# Patient Record
Sex: Female | Born: 1935 | Race: Black or African American | Hispanic: No | Smoking: Former smoker
Health system: Southern US, Community
[De-identification: ages and names within clinical notes are randomized; demographics above are authoritative.]

## PROBLEM LIST (undated history)

## (undated) DIAGNOSIS — D649 Anemia, unspecified: Secondary | ICD-10-CM

## (undated) DIAGNOSIS — F32A Depression, unspecified: Secondary | ICD-10-CM

## (undated) DIAGNOSIS — M069 Rheumatoid arthritis, unspecified: Secondary | ICD-10-CM

## (undated) DIAGNOSIS — F329 Major depressive disorder, single episode, unspecified: Secondary | ICD-10-CM

## (undated) DIAGNOSIS — C50919 Malignant neoplasm of unspecified site of unspecified female breast: Secondary | ICD-10-CM

## (undated) DIAGNOSIS — F419 Anxiety disorder, unspecified: Secondary | ICD-10-CM

## (undated) HISTORY — PX: OTHER SURGICAL HISTORY: SHX169

## (undated) HISTORY — DX: Malignant neoplasm of unspecified site of unspecified female breast: C50.919

## (undated) HISTORY — DX: Anxiety disorder, unspecified: F41.9

## (undated) HISTORY — DX: Major depressive disorder, single episode, unspecified: F32.9

## (undated) HISTORY — DX: Anemia, unspecified: D64.9

## (undated) HISTORY — DX: Rheumatoid arthritis, unspecified: M06.9

## (undated) HISTORY — PX: ANKLE SURGERY: SHX546

## (undated) HISTORY — DX: Depression, unspecified: F32.A

## (undated) HISTORY — PX: CATARACT EXTRACTION: SUR2

---

## 1997-10-21 ENCOUNTER — Encounter: Admission: RE | Admit: 1997-10-21 | Discharge: 1997-10-21 | Payer: Self-pay | Admitting: Hematology and Oncology

## 1998-01-02 ENCOUNTER — Emergency Department (HOSPITAL_COMMUNITY): Admission: EM | Admit: 1998-01-02 | Discharge: 1998-01-02 | Payer: Self-pay | Admitting: Emergency Medicine

## 1998-01-02 ENCOUNTER — Encounter: Payer: Self-pay | Admitting: Emergency Medicine

## 1998-01-05 ENCOUNTER — Emergency Department (HOSPITAL_COMMUNITY): Admission: EM | Admit: 1998-01-05 | Discharge: 1998-01-05 | Payer: Self-pay | Admitting: Emergency Medicine

## 1998-01-05 ENCOUNTER — Encounter: Payer: Self-pay | Admitting: Emergency Medicine

## 1998-03-08 ENCOUNTER — Emergency Department (HOSPITAL_COMMUNITY): Admission: EM | Admit: 1998-03-08 | Discharge: 1998-03-08 | Payer: Self-pay | Admitting: Internal Medicine

## 1999-01-09 ENCOUNTER — Encounter: Payer: Self-pay | Admitting: Emergency Medicine

## 1999-01-09 ENCOUNTER — Emergency Department (HOSPITAL_COMMUNITY): Admission: EM | Admit: 1999-01-09 | Discharge: 1999-01-09 | Payer: Self-pay | Admitting: Emergency Medicine

## 2000-04-01 ENCOUNTER — Emergency Department (HOSPITAL_COMMUNITY): Admission: EM | Admit: 2000-04-01 | Discharge: 2000-04-01 | Payer: Self-pay | Admitting: Emergency Medicine

## 2000-04-12 ENCOUNTER — Encounter: Payer: Self-pay | Admitting: Internal Medicine

## 2000-04-12 ENCOUNTER — Ambulatory Visit (HOSPITAL_COMMUNITY): Admission: RE | Admit: 2000-04-12 | Discharge: 2000-04-12 | Payer: Self-pay | Admitting: Internal Medicine

## 2000-09-07 ENCOUNTER — Encounter: Admission: RE | Admit: 2000-09-07 | Discharge: 2000-09-07 | Payer: Self-pay | Admitting: General Surgery

## 2000-09-07 ENCOUNTER — Encounter (HOSPITAL_BASED_OUTPATIENT_CLINIC_OR_DEPARTMENT_OTHER): Payer: Self-pay | Admitting: General Surgery

## 2001-09-14 ENCOUNTER — Emergency Department (HOSPITAL_COMMUNITY): Admission: EM | Admit: 2001-09-14 | Discharge: 2001-09-14 | Payer: Self-pay | Admitting: *Deleted

## 2002-06-03 ENCOUNTER — Emergency Department (HOSPITAL_COMMUNITY): Admission: EM | Admit: 2002-06-03 | Discharge: 2002-06-03 | Payer: Self-pay

## 2002-06-03 ENCOUNTER — Encounter: Payer: Self-pay | Admitting: Emergency Medicine

## 2003-07-21 ENCOUNTER — Emergency Department (HOSPITAL_COMMUNITY): Admission: EM | Admit: 2003-07-21 | Discharge: 2003-07-21 | Payer: Self-pay | Admitting: Emergency Medicine

## 2004-06-10 ENCOUNTER — Ambulatory Visit: Payer: Self-pay | Admitting: Internal Medicine

## 2004-07-17 ENCOUNTER — Ambulatory Visit: Payer: Self-pay | Admitting: Internal Medicine

## 2004-07-30 ENCOUNTER — Ambulatory Visit: Payer: Self-pay | Admitting: Internal Medicine

## 2004-08-03 ENCOUNTER — Emergency Department (HOSPITAL_COMMUNITY): Admission: EM | Admit: 2004-08-03 | Discharge: 2004-08-03 | Payer: Self-pay | Admitting: Emergency Medicine

## 2004-11-23 ENCOUNTER — Ambulatory Visit: Payer: Self-pay | Admitting: Internal Medicine

## 2005-03-18 ENCOUNTER — Emergency Department (HOSPITAL_COMMUNITY): Admission: EM | Admit: 2005-03-18 | Discharge: 2005-03-18 | Payer: Self-pay | Admitting: Emergency Medicine

## 2005-04-21 ENCOUNTER — Ambulatory Visit: Payer: Self-pay | Admitting: Internal Medicine

## 2005-07-19 ENCOUNTER — Emergency Department (HOSPITAL_COMMUNITY): Admission: EM | Admit: 2005-07-19 | Discharge: 2005-07-19 | Payer: Self-pay | Admitting: Emergency Medicine

## 2006-01-14 ENCOUNTER — Ambulatory Visit: Payer: Self-pay | Admitting: Internal Medicine

## 2006-03-04 ENCOUNTER — Ambulatory Visit: Payer: Self-pay | Admitting: Internal Medicine

## 2006-07-25 ENCOUNTER — Ambulatory Visit: Payer: Self-pay | Admitting: Internal Medicine

## 2006-08-12 ENCOUNTER — Ambulatory Visit: Payer: Self-pay | Admitting: Internal Medicine

## 2006-08-29 ENCOUNTER — Ambulatory Visit: Payer: Self-pay | Admitting: Hematology & Oncology

## 2006-09-23 LAB — CBC WITH DIFFERENTIAL/PLATELET
Basophils Absolute: 0 10*3/uL (ref 0.0–0.1)
EOS%: 1.6 % (ref 0.0–7.0)
Eosinophils Absolute: 0 10*3/uL (ref 0.0–0.5)
HGB: 10.3 g/dL — ABNORMAL LOW (ref 11.6–15.9)
MCH: 33.7 pg (ref 26.0–34.0)
MONO#: 0.2 10*3/uL (ref 0.1–0.9)
Platelets: 278 10*3/uL (ref 145–400)
RDW: 18.3 % — ABNORMAL HIGH (ref 11.3–14.5)
lymph#: 0.3 10*3/uL — ABNORMAL LOW (ref 0.9–3.3)

## 2006-09-26 ENCOUNTER — Ambulatory Visit: Payer: Self-pay | Admitting: Internal Medicine

## 2006-10-05 ENCOUNTER — Encounter: Payer: Self-pay | Admitting: Internal Medicine

## 2006-10-05 ENCOUNTER — Ambulatory Visit: Payer: Self-pay | Admitting: Internal Medicine

## 2006-10-05 DIAGNOSIS — J309 Allergic rhinitis, unspecified: Secondary | ICD-10-CM

## 2006-10-05 DIAGNOSIS — M069 Rheumatoid arthritis, unspecified: Secondary | ICD-10-CM

## 2006-10-12 ENCOUNTER — Ambulatory Visit: Payer: Self-pay | Admitting: Hematology & Oncology

## 2006-11-12 ENCOUNTER — Encounter: Payer: Self-pay | Admitting: *Deleted

## 2006-11-12 DIAGNOSIS — Z9010 Acquired absence of unspecified breast and nipple: Secondary | ICD-10-CM

## 2006-11-12 DIAGNOSIS — C50919 Malignant neoplasm of unspecified site of unspecified female breast: Secondary | ICD-10-CM | POA: Insufficient documentation

## 2006-11-15 ENCOUNTER — Encounter: Payer: Self-pay | Admitting: Internal Medicine

## 2006-11-21 ENCOUNTER — Ambulatory Visit: Payer: Self-pay | Admitting: Internal Medicine

## 2006-11-28 ENCOUNTER — Telehealth: Payer: Self-pay | Admitting: Internal Medicine

## 2006-11-28 ENCOUNTER — Encounter: Payer: Self-pay | Admitting: Endocrinology

## 2006-12-02 ENCOUNTER — Telehealth: Payer: Self-pay | Admitting: Internal Medicine

## 2007-02-10 ENCOUNTER — Encounter: Payer: Self-pay | Admitting: Internal Medicine

## 2007-03-02 ENCOUNTER — Encounter: Payer: Self-pay | Admitting: Internal Medicine

## 2007-03-13 ENCOUNTER — Encounter: Payer: Self-pay | Admitting: Internal Medicine

## 2007-03-31 ENCOUNTER — Encounter: Payer: Self-pay | Admitting: Internal Medicine

## 2007-04-07 ENCOUNTER — Ambulatory Visit: Payer: Self-pay | Admitting: Internal Medicine

## 2007-05-11 ENCOUNTER — Telehealth: Payer: Self-pay | Admitting: Internal Medicine

## 2007-05-14 ENCOUNTER — Encounter: Payer: Self-pay | Admitting: Internal Medicine

## 2007-05-15 ENCOUNTER — Encounter: Payer: Self-pay | Admitting: Internal Medicine

## 2007-05-16 ENCOUNTER — Encounter: Payer: Self-pay | Admitting: Internal Medicine

## 2007-05-19 ENCOUNTER — Encounter: Payer: Self-pay | Admitting: Internal Medicine

## 2007-06-15 ENCOUNTER — Encounter: Payer: Self-pay | Admitting: Internal Medicine

## 2007-06-16 ENCOUNTER — Ambulatory Visit: Payer: Self-pay | Admitting: Internal Medicine

## 2007-06-16 DIAGNOSIS — E785 Hyperlipidemia, unspecified: Secondary | ICD-10-CM | POA: Insufficient documentation

## 2007-06-16 DIAGNOSIS — D649 Anemia, unspecified: Secondary | ICD-10-CM | POA: Insufficient documentation

## 2007-06-16 DIAGNOSIS — F329 Major depressive disorder, single episode, unspecified: Secondary | ICD-10-CM

## 2007-06-16 DIAGNOSIS — F411 Generalized anxiety disorder: Secondary | ICD-10-CM

## 2007-06-16 DIAGNOSIS — J069 Acute upper respiratory infection, unspecified: Secondary | ICD-10-CM | POA: Insufficient documentation

## 2007-06-16 DIAGNOSIS — L989 Disorder of the skin and subcutaneous tissue, unspecified: Secondary | ICD-10-CM | POA: Insufficient documentation

## 2007-06-16 DIAGNOSIS — R5381 Other malaise: Secondary | ICD-10-CM

## 2007-06-16 DIAGNOSIS — R5383 Other fatigue: Secondary | ICD-10-CM

## 2007-06-16 LAB — CONVERTED CEMR LAB
ALT: 19 units/L (ref 0–35)
Albumin: 3.1 g/dL — ABNORMAL LOW (ref 3.5–5.2)
Albumin: 3.5 g/dL (ref 3.5–5.2)
Alkaline Phosphatase: 71 units/L (ref 39–117)
BUN: 11 mg/dL (ref 6–23)
Bilirubin, Direct: 0.2 mg/dL (ref 0.0–0.3)
CO2: 29 meq/L (ref 19–32)
CO2: 31 meq/L (ref 19–32)
Calcium: 8.9 mg/dL (ref 8.4–10.5)
Calcium: 8.9 mg/dL (ref 8.4–10.5)
Creatinine, Ser: 0.9 mg/dL (ref 0.4–1.2)
Folate: >20 ng/mL
HCT: 29.3 % — ABNORMAL LOW (ref 36.0–46.0)
HDL: 30 mg/dL — ABNORMAL LOW (ref >39.0)
Hemoglobin: 10.1 g/dL — ABNORMAL LOW (ref 12.0–15.0)
Hemoglobin: 13.1 g/dL (ref 12.0–15.0)
Iron: 37 ug/dL — ABNORMAL LOW (ref 42–145)
LDL Cholesterol: 76 mg/dL (ref 0–99)
MCV: 90.2 fL (ref 78.0–100.0)
MCV: 95.9 fL (ref 78.0–100.0)
Monocytes Absolute: 0.3 10*3/uL (ref 0.2–0.7)
Monocytes Relative: 10.2 % (ref 3.0–11.0)
Neutro Abs: 1.2 10*3/uL — ABNORMAL LOW (ref 1.4–7.7)
Neutrophils Relative %: 32.1 % — ABNORMAL LOW (ref 43.0–77.0)
Platelets: 212 10*3/uL (ref 150–400)
Potassium: 3.4 meq/L — ABNORMAL LOW (ref 3.5–5.1)
RBC: 3.25 M/uL — ABNORMAL LOW (ref 3.87–5.11)
RBC: 4.05 M/uL (ref 3.87–5.11)
RDW: 16.5 % — ABNORMAL HIGH (ref 11.5–14.6)
TSH: 2.32 microintl units/mL (ref 0.35–5.50)
Total Bilirubin: 0.5 mg/dL (ref 0.3–1.2)
Total Bilirubin: 0.9 mg/dL (ref 0.3–1.2)
Total Protein: 7.8 g/dL (ref 6.0–8.3)
Total Protein: 7.1 g/dL (ref 6.0–8.3)
Triglycerides: 75 mg/dL (ref 0–149)
WBC: 2.9 10*3/uL — ABNORMAL LOW (ref 4.5–10.5)
WBC: 3.6 10*3/uL — ABNORMAL LOW (ref 4.5–10.5)

## 2007-06-19 ENCOUNTER — Encounter: Payer: Self-pay | Admitting: Internal Medicine

## 2007-06-26 ENCOUNTER — Encounter: Payer: Self-pay | Admitting: Internal Medicine

## 2007-06-29 ENCOUNTER — Encounter: Payer: Self-pay | Admitting: Internal Medicine

## 2007-07-12 ENCOUNTER — Encounter: Payer: Self-pay | Admitting: Internal Medicine

## 2007-07-15 ENCOUNTER — Encounter: Payer: Self-pay | Admitting: Internal Medicine

## 2007-07-18 ENCOUNTER — Encounter: Payer: Self-pay | Admitting: Internal Medicine

## 2007-07-19 ENCOUNTER — Encounter: Payer: Self-pay | Admitting: Internal Medicine

## 2007-07-31 ENCOUNTER — Ambulatory Visit: Payer: Self-pay | Admitting: Internal Medicine

## 2007-08-17 ENCOUNTER — Encounter: Payer: Self-pay | Admitting: Internal Medicine

## 2007-08-17 ENCOUNTER — Telehealth: Payer: Self-pay | Admitting: Internal Medicine

## 2007-09-21 ENCOUNTER — Encounter: Payer: Self-pay | Admitting: Internal Medicine

## 2007-09-22 ENCOUNTER — Ambulatory Visit: Payer: Self-pay | Admitting: Internal Medicine

## 2007-09-22 DIAGNOSIS — R1011 Right upper quadrant pain: Secondary | ICD-10-CM

## 2007-09-22 DIAGNOSIS — R82998 Other abnormal findings in urine: Secondary | ICD-10-CM | POA: Insufficient documentation

## 2007-09-23 ENCOUNTER — Encounter: Payer: Self-pay | Admitting: Internal Medicine

## 2007-09-24 ENCOUNTER — Encounter: Payer: Self-pay | Admitting: Internal Medicine

## 2007-09-25 ENCOUNTER — Ambulatory Visit: Payer: Self-pay | Admitting: Internal Medicine

## 2007-09-29 ENCOUNTER — Telehealth: Payer: Self-pay | Admitting: Internal Medicine

## 2007-10-04 ENCOUNTER — Encounter: Payer: Self-pay | Admitting: Internal Medicine

## 2007-10-04 ENCOUNTER — Encounter: Admission: RE | Admit: 2007-10-04 | Discharge: 2007-10-04 | Payer: Self-pay | Admitting: Internal Medicine

## 2007-10-06 ENCOUNTER — Ambulatory Visit: Payer: Self-pay | Admitting: Internal Medicine

## 2007-10-06 DIAGNOSIS — K8020 Calculus of gallbladder without cholecystitis without obstruction: Secondary | ICD-10-CM | POA: Insufficient documentation

## 2007-10-24 ENCOUNTER — Encounter: Payer: Self-pay | Admitting: Internal Medicine

## 2007-11-01 ENCOUNTER — Encounter: Payer: Self-pay | Admitting: Internal Medicine

## 2007-11-15 ENCOUNTER — Encounter: Payer: Self-pay | Admitting: Internal Medicine

## 2007-11-20 ENCOUNTER — Encounter: Payer: Self-pay | Admitting: Internal Medicine

## 2007-12-05 ENCOUNTER — Ambulatory Visit: Payer: Self-pay | Admitting: Internal Medicine

## 2007-12-18 ENCOUNTER — Ambulatory Visit: Payer: Self-pay | Admitting: Internal Medicine

## 2007-12-21 ENCOUNTER — Encounter: Payer: Self-pay | Admitting: Internal Medicine

## 2008-01-15 ENCOUNTER — Encounter: Payer: Self-pay | Admitting: Internal Medicine

## 2008-01-22 ENCOUNTER — Ambulatory Visit: Payer: Self-pay | Admitting: Internal Medicine

## 2008-03-20 ENCOUNTER — Encounter: Payer: Self-pay | Admitting: Internal Medicine

## 2008-04-16 ENCOUNTER — Encounter: Payer: Self-pay | Admitting: Internal Medicine

## 2008-04-18 ENCOUNTER — Encounter: Payer: Self-pay | Admitting: Internal Medicine

## 2008-04-23 ENCOUNTER — Encounter: Payer: Self-pay | Admitting: Internal Medicine

## 2008-05-22 ENCOUNTER — Encounter: Payer: Self-pay | Admitting: Internal Medicine

## 2008-06-16 ENCOUNTER — Encounter: Payer: Self-pay | Admitting: Internal Medicine

## 2008-07-18 ENCOUNTER — Encounter: Payer: Self-pay | Admitting: Internal Medicine

## 2008-07-26 ENCOUNTER — Ambulatory Visit: Payer: Self-pay | Admitting: Internal Medicine

## 2008-08-06 ENCOUNTER — Encounter: Payer: Self-pay | Admitting: Internal Medicine

## 2008-08-09 ENCOUNTER — Encounter: Payer: Self-pay | Admitting: Internal Medicine

## 2008-08-20 ENCOUNTER — Encounter: Payer: Self-pay | Admitting: Internal Medicine

## 2008-08-23 ENCOUNTER — Encounter: Payer: Self-pay | Admitting: Internal Medicine

## 2008-09-02 ENCOUNTER — Encounter: Payer: Self-pay | Admitting: Internal Medicine

## 2008-09-03 ENCOUNTER — Encounter: Payer: Self-pay | Admitting: Internal Medicine

## 2008-10-08 ENCOUNTER — Ambulatory Visit: Payer: Self-pay | Admitting: Internal Medicine

## 2008-10-24 ENCOUNTER — Ambulatory Visit: Payer: Self-pay | Admitting: Internal Medicine

## 2009-05-05 ENCOUNTER — Encounter: Payer: Self-pay | Admitting: Internal Medicine

## 2009-07-19 ENCOUNTER — Emergency Department (HOSPITAL_COMMUNITY): Admission: EM | Admit: 2009-07-19 | Discharge: 2009-07-20 | Payer: Self-pay | Admitting: Emergency Medicine

## 2009-07-24 ENCOUNTER — Ambulatory Visit: Payer: Self-pay | Admitting: Internal Medicine

## 2009-07-24 ENCOUNTER — Telehealth: Payer: Self-pay | Admitting: Internal Medicine

## 2009-07-30 ENCOUNTER — Telehealth: Payer: Self-pay | Admitting: Internal Medicine

## 2009-08-05 ENCOUNTER — Ambulatory Visit: Payer: Self-pay | Admitting: Internal Medicine

## 2009-08-06 LAB — CONVERTED CEMR LAB
ALT: 17 units/L (ref 0–35)
AST: 27 units/L (ref 0–37)
Alkaline Phosphatase: 86 units/L (ref 39–117)
Basophils Absolute: 0 10*3/uL (ref 0.0–0.1)
Basophils Relative: 0.8 % (ref 0.0–3.0)
Bilirubin, Direct: 0.2 mg/dL (ref 0.0–0.3)
Calcium: 8.8 mg/dL (ref 8.4–10.5)
Creatinine, Ser: 0.7 mg/dL (ref 0.4–1.2)
Eosinophils Absolute: 0 10*3/uL (ref 0.0–0.7)
Folate: >20 ng/mL
Glucose, Bld: 78 mg/dL (ref 70–99)
HCT: 33.2 % — ABNORMAL LOW (ref 36.0–46.0)
Lymphocytes Relative: 41.5 % (ref 12.0–46.0)
MCV: 97.6 fL (ref 78.0–100.0)
Potassium: 4 meq/L (ref 3.5–5.1)
RBC: 3.4 M/uL — ABNORMAL LOW (ref 3.87–5.11)
RDW: 17.6 % — ABNORMAL HIGH (ref 11.5–14.6)
WBC: 2.2 10*3/uL — ABNORMAL LOW (ref 4.5–10.5)

## 2009-09-22 ENCOUNTER — Telehealth: Payer: Self-pay | Admitting: Internal Medicine

## 2009-09-30 ENCOUNTER — Encounter: Payer: Self-pay | Admitting: Internal Medicine

## 2009-10-22 ENCOUNTER — Encounter: Payer: Self-pay | Admitting: Internal Medicine

## 2009-12-22 ENCOUNTER — Ambulatory Visit: Payer: Self-pay | Admitting: Internal Medicine

## 2009-12-23 ENCOUNTER — Encounter: Payer: Self-pay | Admitting: Internal Medicine

## 2010-02-03 NOTE — Progress Notes (Signed)
Summary: F/U OV   Phone Note Call from Patient Call back at [facility] Phone [phone]   Summary of Call: Someone called for pt, c/o trouble w/medication.  Initial call taken by: Lamar Sprinkles, CMA,  July 24, 2009 10:29 AM  Follow-up for Phone Call        Spoke w/pt. She c/o weakness from medications. ER advised pt to f/u w/pcp. Scheduled for office visit today.  Follow-up by: Lamar Sprinkles, CMA,  July 24, 2009 10:44 AM  Additional Follow-up for Phone Call Additional follow up Details #1::        seen Additional Follow-up by: Jacques Navy MD,  July 24, 2009 4:15 PM

## 2010-02-03 NOTE — Letter (Signed)
Summary: Stacey Drain MD  Stacey Drain MD   Imported By: Sherian Rein 10/30/2009 13:25:48  _____________________________________________________________________  External Attachment:    Type:   Image     Comment:   External Document

## 2010-02-03 NOTE — Letter (Signed)
Summary: Handicapped [employer]acard/[facility]  Handicapped Placard/[facility]   Imported By: Sherian Rein 10/01/2009 13:56:24  _____________________________________________________________________  External Attachment:    Type:   Image     Comment:   External Document

## 2010-02-03 NOTE — Letter (Signed)
Summary: Aundra Dubin MD  Aundra Dubin MD   Imported By: Sherian Rein 05/14/2009 09:20:54  _____________________________________________________________________  External Attachment:    Type:   Image     Comment:   External Document

## 2010-02-03 NOTE — Assessment & Plan Note (Signed)
Summary: PER [name] SCHED--FU---STC   Vital Signs:  Patient profile:   77 year old female Height:      63 inches Weight:      110 pounds BMI:     19.56 O2 Sat:      97 % on Room air Temp:     97.1 degrees F oral Pulse rate:   76 / minute BP sitting:   122 / 70  (right arm) Cuff size:   regular  Vitals Entered By: Bill Salinas CMA (August 05, 2009 10:48 AM)  O2 Flow:  Room air Comments Pt states as of right now she is not taking any of her medications/ ab   Primary Care Zyiere Rosemond:  [facility]   History of Present Illness: Patient seen July 21 st for weakness. That note reviewed. In the interim she has stopped taking prednisone and Arava and she feels that she is doing better without these two medications. She continues to feel run down. Her diet is not taking in much protein nor supplements. She has not had any recent labwork here, but may have had lab at Dr. Jiles Garter.   Preventive Screening-Counseling & Management  Alcohol-Tobacco     Alcohol drinks/day: 0     Smoking Status: never  Current Medications (verified): 1)  Folic Acid 1 Mg  Tabs (Folic Acid) .... Take One Tablet Once Daily 2)  Prednisone 10 Mg  Tabs (Prednisone) .Marland Kitchen.. 1 By Mouth Qd 3)  Arava 20 Mg  Tabs (Leflunomide) .Marland Kitchen.. 1 By Mouth Qd 4)  Cetirizine Hcl 10 Mg  Tabs (Cetirizine Hcl) .Marland Kitchen.. 1 By Mouth Once Daily As Needed Allergy 5)  Calcium-D 600-200 Mg-Unit Tabs (Calcium Carbonate-Vitamin D) .... Take 1 Tablet By Mouth Once A Day 6)  Alprazolam 0.25 Mg Tbdp (Alprazolam) .Marland Kitchen.. 1 By Mouth Q6 As Needed "nervous Feeling."  Allergies (verified): 1)  ! Penicillin  Past History:  Past Medical History: Last updated: 06/16/2007 ARTHRITIS, RHEUMATOID (ICD-714.0) RHINITIS, ALLERGIC NEC (ICD-477.8) Hx of ADENOCARCINOMA, BREAST (ICD-174.9) Anxiety Depression with hospn Anemia-NOS Hyperlipidemia venous insufficiency/peripheral edema  Past Surgical History: Last updated: 06/16/2007 MASTECTOMY, LEFT, HX OF  (ICD-V45.71) s/p ORIF left leg 1984 Cataract extraction PSH reviewed for relevance, FH reviewed for relevance  Review of Systems       The patient complains of dyspnea on exertion, muscle weakness, and difficulty walking.  The patient denies anorexia, fever, weight loss, chest pain, peripheral edema, prolonged cough, abdominal pain, severe indigestion/heartburn, suspicious skin lesions, and depression.    Physical Exam  General:  Elderly and frail appearing AA female in no distress Head:  normocephalic and atraumatic.   Eyes:  vision grossly intact.  C&S clear without jaundice Mouth:  poor dentition Lungs:  normal respiratory effort and normal breath sounds.   Heart:  normal rate and regular rhythm.   Abdomen:  soft, non-tender, and normal bowel sounds.   Msk:  enalrge DIP and PIP joints. No erythema, no synovial thickening, no joint tenderness. Pulses:  2+ radial Neurologic:  alert & oriented X3 and cranial nerves II-XII intact.   Skin:  turgor normal and color normal.   Psych:  Oriented X3, memory intact for recent and remote, and normally interactive.     Impression & Recommendations:  Problem # 1:  FATIGUE (ICD-780.79) Patient with persistent weakness without focal complaints. Suspect this is multi-factorial including advanced age, nutrition issues, her underlying chronic disease.  Plan - lab evaluation to r/o underlying metabolic derangement or malnutrition.   Orders: TLB-CBC Platelet -  w/Differential (85025-CBCD) TLB-BMP (Basic Metabolic Panel-BMET) (80048-METABOL) TLB-B12 + Folate Pnl (16109_60454-U98/JXB) TLB-Protein, Total (84155-TP) TLB-Albumin (82040-ALB) TLB-TSH (Thyroid Stimulating Hormone) (84443-TSH) TLB-IBC Pnl (Iron/FE;Transferrin) (83550-IBC) TLB-Hepatic/Liver Function Pnl (80076-HEPATIC)  Addendum - lab results reveal mild leukopenia with WBC 2.2, mild anemia. Labs otherwise normal. (see below)  Plan - better nutrition. More exercise and movement.             If leaving off prednisone and Arava need to watch carefully for any recurrent RA symptoms.   Complete Medication List: 1)  Folic Acid 1 Mg Tabs (Folic acid) .... Take one tablet once daily 2)  Prednisone 10 Mg Tabs (Prednisone) .Marland Kitchen.. 1 by mouth qd 3)  Arava 20 Mg Tabs (Leflunomide) .Marland Kitchen.. 1 by mouth qd 4)  Cetirizine Hcl 10 Mg Tabs (Cetirizine hcl) .Marland Kitchen.. 1 by mouth once daily as needed allergy 5)  Calcium-d 600-200 Mg-unit Tabs (Calcium carbonate-vitamin d) .... Take 1 tablet by mouth once a day 6)  Alprazolam 0.25 Mg Tbdp (Alprazolam) .Marland Kitchen.. 1 by mouth q6 as needed "nervous feeling."  Patient: Angela [name] Note: All result statuses are Final unless otherwise noted.  Tests: (1) CBC Platelet w/Diff (CBCD)   White Cell Count     [L]  2.2 K/uL                    4.5-10.5     Rechecked and verified result.   Red Cell Count       [L]  3.40 Mil/uL                 3.87-5.11   Hemoglobin           [L]  11.4 g/dL                   14.7-82.9   Hematocrit           [L]  33.2 %                      36.0-46.0   MCV                       97.6 fl                     78.0-100.0   MCHC                      34.5 g/dL                   56.2-13.0   RDW                  [H]  17.6 %                      11.5-14.6   Platelet Count            241.0 K/uL                  150.0-400.0   Neutrophil %         [L]  41.0 %                      43.0-77.0   Lymphocyte %              41.5 %  12.0-46.0   Monocyte %           [H]  14.9 %                      3.0-12.0   Eosinophils%              1.8 %                       0.0-5.0   Basophils %               0.8 %                       0.0-3.0   Neutrophill Absolute [L]  0.9 K/uL                    1.4-7.7   Lymphocyte Absolute       0.9 K/uL                    0.7-4.0   Monocyte Absolute         0.3 K/uL                    0.1-1.0  Eosinophils, Absolute                             0.0 K/uL                    0.0-0.7   Basophils Absolute        0.0  K/uL                    0.0-0.1  Tests: (2) BMP (METABOL)   Sodium                    145 mEq/L                   135-145   Potassium                 4.0 mEq/L                   3.5-5.1   Chloride                  110 mEq/L                   96-112   Carbon Dioxide            27 mEq/L                    19-32   Glucose                   78 mg/dL                    91-47   BUN                       14 mg/dL                    8-29   Creatinine                0.7 mg/dL  0.4-1.2   Calcium                   8.8 mg/dL                   7.2-53.6   GFR                       97.97 mL/min                >60  Tests: (3) B12 + Folate Panel (B12/FOL)   Vitamin B12          [H]  1018 pg/mL                  211-911   Folate                    >20.0 ng/mL     Deficient  0.4 - 3.4 ng/mL     Indeterminate  3.4 - 5.4 ng/mL     Normal  >5.4 ng/mL  Tests: (4) Total Protein (TP)   Total Protein             6.4 g/dL                    6.4-4.0  Tests: (5) Albumin (ALB)   Albumin                   3.6 g/dL                    3.4-7.4  Tests: (6) TSH (TSH)   FastTSH                   1.86 uIU/mL                 0.35-5.50  Tests: (7) IBC Panel (IBC)   Iron                      62 ug/dL                    25-956   Transferrin          [L]  163.1 mg/dL                 387.5-643.3   Iron Saturation           27.2 %                      20.0-50.0  Tests: (8) Hepatic/Liver Function Panel (HEPATIC)   Total Bilirubin           0.6 mg/dL                   2.9-5.1   Direct Bilirubin          0.2 mg/dL                   8.8-4.1   Alkaline Phosphatase      86 U/L                      39-117   AST                       27 U/L                      0-37  ALT                       17 U/L                      0-35   Total Protein             6.4 g/dL                    1.6-1.0   Albumin                   3.6 g/dL                    9.6-0.4

## 2010-02-03 NOTE — Assessment & Plan Note (Signed)
Summary: weakness/?from medication/SD   Vital Signs:  Patient profile:   77 year old female Height:      63 inches Weight:      110 pounds BMI:     19.56 O2 Sat:      97 % on Room air Temp:     97.8 degrees F oral Pulse rate:   85 / minute BP sitting:   112 / 64  (left arm) Cuff size:   regular  Vitals Entered By: Bill Salinas CMA (July 24, 2009 2:21 PM)  O2 Flow:  Room air CC: ov to discuss medications. Pt states medications makes me feel nervous/ab   Primary Care Provider:  Zevin Nevares  CC:  ov to discuss medications. Pt states medications makes me feel nervous/ab.  History of Present Illness: Patient presents to discuss medications: she says her medication makes her too weak. She was seen in the ED over the weekend. Reviewed records: normal exam except for w/c bound and very elderly, non-focal neuro exam; CBC, CMet, cardiac enzymes normal, CXR normal, EKG normal, CT brain normal. No CVA or other metabolic derangement noted.   Patient since the ED visit has not taken her medication and still has weakness.   Current Medications (verified): 1)  Folic Acid 1 Mg  Tabs (Folic Acid) .... Take One Tablet Once Daily 2)  Prednisone 10 Mg  Tabs (Prednisone) .Marland Kitchen.. 1 By Mouth Qd 3)  Arava 20 Mg  Tabs (Leflunomide) .Marland Kitchen.. 1 By Mouth Qd 4)  Cetirizine Hcl 10 Mg  Tabs (Cetirizine Hcl) .Marland Kitchen.. 1 By Mouth Once Daily As Needed Allergy 5)  Calcium-D 600-200 Mg-Unit Tabs (Calcium Carbonate-Vitamin D) .... Take 1 Tablet By Mouth Once A Day 6)  Alprazolam 0.25 Mg Tbdp (Alprazolam) .Marland Kitchen.. 1 By Mouth Q6 As Needed "nervous Feeling."  Allergies (verified): 1)  ! Penicillin  Past History:  Past Medical History: Last updated: 06/16/2007 ARTHRITIS, RHEUMATOID (ICD-714.0) RHINITIS, ALLERGIC NEC (ICD-477.8) Hx of ADENOCARCINOMA, BREAST (ICD-174.9) Anxiety Depression with hospn Anemia-NOS Hyperlipidemia venous insufficiency/peripheral edema  Past Surgical History: Last updated: 06/16/2007 MASTECTOMY,  LEFT, HX OF (ICD-V45.71) s/p ORIF left leg 1984 Cataract extraction  Family History: Last updated: 10/08/2008 father with  cancer mother with DM sister with breast cancer daughter with leukemia son with arthritis  Social History: Last updated: 06/16/2007 widow since 1970 wheelchair bound, lives with son Never Smoked Alcohol use-no 8 children  Risk Factors: Smoking Status: never (06/16/2007) Passive Smoke Exposure: no (09/22/2007)  Review of Systems       The patient complains of muscle weakness and difficulty walking.  The patient denies anorexia, fever, chest pain, syncope, prolonged cough, headaches, abdominal pain, severe indigestion/heartburn, suspicious skin lesions, abnormal bleeding, and enlarged lymph nodes.    Physical Exam  General:  Very elderly, thin AA female in her w/c in no distress.  Head:  normocephalic and atraumatic.   Neck:  supple.   Lungs:  normal respiratory effort and normal breath sounds.   Heart:  normal rate and regular rhythm.   Msk:  no joint swelling, no joint warmth, and no redness over joints.   Neurologic:  alert & oriented X3.   Skin:  turgor normal and color normal.   Psych:  Oriented X3, normally interactive, and good eye contact.     Impression & Recommendations:  Problem # 1:  FATIGUE (ICD-780.79) Patient c/o weakness that she attributes to her medications. Her ED eval was very thorough and no additional testing is indicated. Her weakness is most likely related  to age and inanition than to medicatinos.  Plan continue to take calcium nd folic acid.         Take only prednisone for three days - assess whether this increases her weakness         Take only ARava for three days - assess whether this increases her weakness  Complete Medication List: 1)  Folic Acid 1 Mg Tabs (Folic acid) .... Take one tablet once daily 2)  Prednisone 10 Mg Tabs (Prednisone) .Marland Kitchen.. 1 by mouth qd 3)  Arava 20 Mg Tabs (Leflunomide) .Marland Kitchen.. 1 by mouth qd 4)   Cetirizine Hcl 10 Mg Tabs (Cetirizine hcl) .Marland Kitchen.. 1 by mouth once daily as needed allergy 5)  Calcium-d 600-200 Mg-unit Tabs (Calcium carbonate-vitamin d) .... Take 1 tablet by mouth once a day 6)  Alprazolam 0.25 Mg Tbdp (Alprazolam) .Marland Kitchen.. 1 by mouth q6 as needed "nervous feeling."

## 2010-02-03 NOTE — Progress Notes (Signed)
Summary: Continued weakness  Phone Note Call from Patient Call back at Home Phone [phone]   Summary of Call: Pt left vm that she took the pills that MD advised and wants a call back.  Initial call taken by: Lamar Sprinkles, CMA,  July 30, 2009 1:22 PM  Follow-up for Phone Call        Spoke w/pt. She stopped predinsone monday, noticed no change in symptoms. Per pt, she becomes weak when she takes all her meds. She noticed increase in weakness after taking folic acid and calcium.   Pt feels she may need to be put in the hosptial for a few days to "sort out" what the cause is. Please advise.  Follow-up by: Lamar Sprinkles, CMA,  July 30, 2009 4:38 PM  Additional Follow-up for Phone Call Additional follow up Details #1::        resume prednisone  Has she stopper Arava?  She can stop calcium.  Doesn't really meet criteria of severity for hospital adm. If she remains in a bad way can consider re-eval in the office MOnday or Tuesday with admission if warranted.  Additional Follow-up by: Jacques Navy MD,  July 31, 2009 9:01 AM    Additional Follow-up for Phone Call Additional follow up Details #2::    informed pt of MD's advisement to continue prednisone and to stop calcium. pt states that prednisone seemed to make her weaker. pt would like to be seen before Monday if possible. ok to sched to see another MD? please advise Follow-up by: Brenton Grills MA,  July 31, 2009 2:25 PM  Additional Follow-up for Phone Call Additional follow up Details #3:: Details for Additional Follow-up Action Taken: It will be difficult for another doctor to pick this up and do much. If she feels that she is in distress then she may be evaluated in the ED for possible admission.    pt notified of MD's advisement and scheduled appt for 08/05/09 Additional Follow-up by: Jacques Navy MD,  July 31, 2009 4:25 PM

## 2010-02-03 NOTE — Progress Notes (Signed)
Summary: FORM (done)  Phone Note Call from Patient   Summary of Call: Patient is requesting handicap form to be completed. She has form and wants to know what to do w/it.  Initial call taken by: Lamar Sprinkles, CMA,  September 22, 2009 11:59 AM

## 2010-02-05 NOTE — Assessment & Plan Note (Signed)
Summary: EVAL FOR POWER CHAIR/ [employer]   Vital Signs:  Patient profile:   77 year old female Height:      63 inches Weight:      120 pounds BMI:     19.56 O2 Sat:      97 % on Room air Temp:     98.4 degrees F oral Pulse rate:   77 / minute BP sitting:   110 / 62  (left arm) Cuff size:   regular  Vitals Entered By: Bill Salinas CMA (December 22, 2009 12:58 PM)  O2 Flow:  Room air CC: pt here for scooter store forms, pt states she is not taking any medications/ ab   Primary Care Jowanda Heeg:  [facility]  CC:  pt here for scooter store forms and pt states she is not taking any medications/ ab.  History of Present Illness: Patient presents for a mobility exam. she, after two years of receiving mailings soliciting her to apply for a power mobility device, she contacted the [employer] store and now needs appropriate paperwork completed. she has never tried to use either a power scooter or w/c and has some questions as to whether there is room in her home or if she has the manual ability to operate a device. Ryane Alwyn Ren of the scooter store was contacted and she assures me that these considerations are dealt with upon delivery of the device to the paitent's home and that if there are physical space limitations or inability to operate the device this is considered.  The patient has progressive RA. She has been seeing Dr. Kellie Simmering for rhematology with her last visit being about 1 month ago. She has been on a variety of medications but at this time is not taking an disease modifying medications.  She has been w/c bound for at least two years. She is able to make transfers from bed to chair and bedside commode but otherwise uses a w/c in the home. Her son lives with her and is able to provide her some assistance. She does have a CNA who comes to the home 5 days/wk and this will be extended to 7 days/ week soon. Patient needs assist with transfers, bathing, toileting, dressing and she does not prepare meals or  do any home maintenance.   Fortunately the patient does not have a great deal of pain.   Current Medications (verified): 1)  Folic Acid 1 Mg  Tabs (Folic Acid) .... Take One Tablet Once Daily 2)  Prednisone 10 Mg  Tabs (Prednisone) .Marland Kitchen.. 1 By Mouth Qd 3)  Arava 20 Mg  Tabs (Leflunomide) .Marland Kitchen.. 1 By Mouth Qd 4)  Cetirizine Hcl 10 Mg  Tabs (Cetirizine Hcl) .Marland Kitchen.. 1 By Mouth Once Daily As Needed Allergy 5)  Calcium-D 600-200 Mg-Unit Tabs (Calcium Carbonate-Vitamin D) .... Take 1 Tablet By Mouth Once A Day 6)  Alprazolam 0.25 Mg Tbdp (Alprazolam) .Marland Kitchen.. 1 By Mouth Q6 As Needed "nervous Feeling."  Allergies (verified): 1)  ! Penicillin  Past History:  Past Medical History: Last updated: 06/16/2007 ARTHRITIS, RHEUMATOID (ICD-714.0) RHINITIS, ALLERGIC NEC (ICD-477.8) Hx of ADENOCARCINOMA, BREAST (ICD-174.9) Anxiety Depression with hospn Anemia-NOS Hyperlipidemia venous insufficiency/peripheral edema  Past Surgical History: Last updated: 06/16/2007 MASTECTOMY, LEFT, HX OF (ICD-V45.71) s/p ORIF left leg 1984 Cataract extraction  Family History: Last updated: 10/08/2008 father with  cancer mother with DM sister with breast cancer daughter with leukemia son with arthritis  Social History: Last updated: 06/16/2007 widow since 1970 wheelchair bound, lives with son Never Smoked  Alcohol use-no 8 children  Review of Systems       The patient complains of weight loss, vision loss, muscle weakness, difficulty walking, and depression.  The patient denies anorexia, fever, weight gain, decreased hearing, chest pain, peripheral edema, headaches, abdominal pain, severe indigestion/heartburn, suspicious skin lesions, unusual weight change, abnormal bleeding, and enlarged lymph nodes.         dense cataract left eye, frequent blurred vision. She reports it has been at least a year since she saw an eye doctor.   Physical Exam  General:  Eldery, thin AA woman in no actue distress Head:   normocephalic and atraumatic.   Eyes:  dense cataract left eye. She has fair vision right eye, very poor vision left eye on simple confrontation Mouth:  very poor dentition with broken teeth and missing teeth Neck:  full ROM and no thyromegaly.   Chest Wall:  no deformities and no tenderness.   Lungs:  normal respiratory effort and normal breath sounds.   Heart:  normal rate and regular rhythm with pvcs.  no rub.   Abdomen:  soft and non-tender.   Msk:  both wrists with limited flexion and extension. Grip is 2/5 bilaterally. she is unable to raise her right arm due to shoulder stiffness. Left arm can be raised 40% of normal. Patinet can flex both legs but cannot hold against resistance. she is unable to straighten her legs on demand Pulses:  2+ radial Extremities:  2+ pedal edema bilaterally Neurologic:  alert & oriented X3 and cranial nerves II-XII intact.  Wheel chair bound Skin:  turgor normal and color normal.  Back and sacrum not examined.  Cervical Nodes:  no anterior cervical adenopathy and no posterior cervical adenopathy.   Axillary Nodes:  no R axillary adenopathy and no L axillary adenopathy.   Psych:  Oriented X3, memory intact for recent and remote, normally interactive, and good eye contact.     Impression & Recommendations:  Problem # 1:  ARTHRITIS, RHEUMATOID (ICD-714.0) Patient is incapacitated by her RA. If she is able to operate a power mobility device, e.g. a power w/c and it fits in her abode then she is a good candidate for this.  Complete Medication List: 1)  Folic Acid 1 Mg Tabs (Folic acid) .... Take one tablet once daily 2)  Prednisone 10 Mg Tabs (Prednisone) .Marland Kitchen.. 1 by mouth qd 3)  Arava 20 Mg Tabs (Leflunomide) .Marland Kitchen.. 1 by mouth qd 4)  Cetirizine Hcl 10 Mg Tabs (Cetirizine hcl) .Marland Kitchen.. 1 by mouth once daily as needed allergy 5)  Calcium-d 600-200 Mg-unit Tabs (Calcium carbonate-vitamin d) .... Take 1 tablet by mouth once a day 6)  Alprazolam 0.25 Mg Tbdp  (Alprazolam) .Marland Kitchen.. 1 by mouth q6 as needed "nervous feeling."   Orders Added: 1)  Est. Patient Level IV [04540]

## 2010-02-05 NOTE — Letter (Signed)
Summary: [employer]   Imported By: Sherian Rein 12/26/2009 10:42:13  _____________________________________________________________________  External Attachment:    Type:   Image     Comment:   External Document

## 2010-02-05 NOTE — Letter (Signed)
Summary: [employer]   Imported By: Sherian Rein 12/30/2009 12:51:32  _____________________________________________________________________  External Attachment:    Type:   Image     Comment:   External Document

## 2010-03-14 ENCOUNTER — Emergency Department (HOSPITAL_COMMUNITY)
Admission: EM | Admit: 2010-03-14 | Discharge: 2010-03-14 | Disposition: A | Discharge status: Home / Self Care | Payer: PRIVATE HEALTH INSURANCE | Attending: Emergency Medicine | Admitting: Emergency Medicine

## 2010-03-14 DIAGNOSIS — Z853 Personal history of malignant neoplasm of breast: Secondary | ICD-10-CM | POA: Insufficient documentation

## 2010-03-14 DIAGNOSIS — J40 Bronchitis, not specified as acute or chronic: Secondary | ICD-10-CM | POA: Insufficient documentation

## 2010-03-14 DIAGNOSIS — F411 Generalized anxiety disorder: Secondary | ICD-10-CM | POA: Insufficient documentation

## 2010-03-14 DIAGNOSIS — R059 Cough, unspecified: Secondary | ICD-10-CM | POA: Insufficient documentation

## 2010-03-14 DIAGNOSIS — M069 Rheumatoid arthritis, unspecified: Secondary | ICD-10-CM | POA: Insufficient documentation

## 2010-03-14 DIAGNOSIS — R05 Cough: Secondary | ICD-10-CM | POA: Insufficient documentation

## 2010-03-21 LAB — POCT CARDIAC MARKERS
CKMB, poc: 2.1 ng/mL (ref 1.0–8.0)
Myoglobin, poc: 72 ng/mL (ref 12–200)
Troponin i, poc: <0.05 ng/mL (ref 0.00–0.09)

## 2010-03-21 LAB — URINE CULTURE: Colony Count: NO GROWTH

## 2010-03-21 LAB — DIFFERENTIAL
Basophils Absolute: 0 10*3/uL (ref 0.0–0.1)
Eosinophils Relative: 1 % (ref 0–5)
Lymphocytes Relative: 41 % (ref 12–46)
Monocytes Relative: 8 % (ref 3–12)
Neutrophils Relative %: 50 % (ref 43–77)

## 2010-03-21 LAB — URINALYSIS, ROUTINE W REFLEX MICROSCOPIC
Hgb urine dipstick: NEGATIVE
Ketones, ur: NEGATIVE mg/dL
Specific Gravity, Urine: 1.015 (ref 1.005–1.030)
Urobilinogen, UA: 0.2 mg/dL (ref 0.0–1.0)
pH: 7 (ref 5.0–8.0)

## 2010-03-21 LAB — CBC: MCH: 33.2 pg (ref 26.0–34.0)

## 2010-03-21 LAB — BASIC METABOLIC PANEL
Calcium: 9 mg/dL (ref 8.4–10.5)
GFR calc Af Amer: >60 mL/min (ref >60)
Potassium: 3.9 mEq/L (ref 3.5–5.1)

## 2010-03-21 LAB — BRAIN NATRIURETIC PEPTIDE: Pro B Natriuretic peptide (BNP): 74.8 pg/mL (ref 0.0–100.0)

## 2010-05-19 NOTE — Assessment & Plan Note (Signed)
[facility]                           PRIMARY CARE OFFICE NOTE   Angela [name], Angela [name]                     MRN:          [id number]  DATE:08/12/2006                            DOB:          06/04/1918    [name] is a very pleasant 77 year old African-American woman  who presents for followup evaluation and exam.  She was last seen in the  office January 13, 2006.   1. In the interval since her visit, the patient continues to see Dr.      Stacey Drain for rheumatology.  She is currently taking      methotrexate and prednisone for her rheumatoid arthritis.  She was      last seen by Dr. Kellie Simmering May of 2008.  She was stable at that time      with stable laboratories.  2. Derm:  The patient has aging spots.  She has been seeing Dr. Lonni Fix      for this.  She is using Akurza 6% lotion.  3. Allergy:  The patient continues to see Dr. Barnetta Chapel at [facility]      Allergy and Asthma.   PAST MEDICAL HISTORY:  1. Usual childhood disease.  2. Nervous breakdown with hospitalization.  3. Rheumatoid arthritis.  4. Breast cancer.   SURGICAL HISTORY:  1. ORIF of broken leg in 1984.  2. Mastectomy, left without adjuvant therapy.   CURRENT MEDICATIONS:  1. Methotrexate 12.5 mg weekly.  2. Calcium 600 mg daily with vitamin D.  3. Prednisone 10 mg daily.  4. Akurza lotion as noted.  5. Celebrex 200 mg p.r.n.   DRUG ALLERGIES:  PENICILLIN.   FAMILY HISTORY:  Father died at age 5 of cancer.  Mother died in her  62s and was diabetic.  The patient had a sister who died of breast  cancer.   SOCIAL HISTORY:  The patient was widowed in 1970.  She is a gravida 9,  para 8.  She has multiple grandchildren.  She has 5 or more great-  grandchildren.  The patient does reside in a rented home and lives with  her son.  She is on [employer] income.  The patient has significant  disability, but does manage a significant number of ADLs.  She is  wheelchair  bound because of her arthritis.   REVIEW OF SYSTEMS:  Negative for any constitutional, cardiovascular,  respiratory, or GI complaints at this time.   EXAMINATION:  Temperature 97.9, blood pressure 123/72, heart rate was  71.  The patient was not weighed.  GENERAL APPEARANCE:  This is an elderly, frail-appearing African-  American woman in no acute distress.  HEENT:  Normocephalic, atraumatic.  EACs and TMs were normal.  Oropharynx with very poor dentition.  Conjunctivae, sclerae clear.  The  patient has a very dense cataract on the left eye.  The patient has a  small cataract on the right eye and funduscopic exam was unremarkable.  The patient is noticed to have some temporal wasting.  NECK:  Supple.  CHEST:  No deformities are noted.  LUNGS:  Clear with no  rales, wheezes, or rhonchi.  CARDIOVASCULAR:  2+ radial pulse.  She had a regular rate and rhythm  with a 1/6 to 2/6 systolic murmur heard best at the left sternal border.  BREASTS:  The patient has no breast on the left.  There is no nodular  abnormality along her scar line.  The right breast was normal with no  fixed mass, lesion, or abnormalities.  This exam was carried out while  sitting in her wheelchair.  ABDOMEN:  Soft.  No guarding or rebound.  NODES:  No adenopathy was appreciated.  GENITOURINARY:  Deferred.  RECTAL:  Deferred.  EXTREMITIES:  The patient has significant MCP enlargement with some  ulnar deviation.  She has interosseous wasting of both hands.  Lower  extremities appear normal.  DERM:  The patient has multiple macular round lesions.  She has no other  suspicious lesions.   We have no recent laboratory on this patient, but she is followed on a  regular basis by Dr. Kellie Simmering.   CHART REVIEW:  The patient has had lower extremity Dopplers, which  showed normal circulation.  Last chest x-ray was June 10, 2004, which  showed borderline cardiomegaly with mild pulmonary vascular congestion  with mild COPD  changes.   ASSESSMENT AND PLAN:  1. Rheumatologic:  The patient with rheumatoid arthritis, followed by      Dr. Kellie Simmering.  At this time, she is tolerating methotrexate and      prednisone well.  She seems to be comfortable.  She has no obvious      inflammation of joints at this time.  2. Dermatologic:  The patient with macular lesions consistent with      aging.  No further evaluation needed.  3. Ophthalmologic:  The patient with very dense cataract on the left,      cataract on the right.  I have recommended she see ophthalmology.  4. Oncology.  The patient has not had a recent mammogram to my      knowledge.  Her examination today is unremarkable.   SUMMARY:  This is a pleasant woman who has been recalcitrant to undergo  treatment in the past.  Currently is taking medication for rheumatoid  arthritis.  She does seem to be medically stable at this time.  She is  asked to return to see me in 6 months.     Angela Gess Norins, MD  Electronically Signed    MEN/MedQ  DD: 08/13/2006  DT: 08/13/2006  Job #: [id number]   cc:   Aundra Dubin, M.D.

## 2010-07-10 ENCOUNTER — Emergency Department (HOSPITAL_COMMUNITY)
Admission: EM | Admit: 2010-07-10 | Discharge: 2010-07-10 | Disposition: A | Discharge status: Home / Self Care | Payer: PRIVATE HEALTH INSURANCE | Attending: Emergency Medicine | Admitting: Emergency Medicine

## 2010-07-10 ENCOUNTER — Emergency Department (HOSPITAL_COMMUNITY): Payer: PRIVATE HEALTH INSURANCE

## 2010-07-10 DIAGNOSIS — R059 Cough, unspecified: Secondary | ICD-10-CM | POA: Insufficient documentation

## 2010-07-10 DIAGNOSIS — R05 Cough: Secondary | ICD-10-CM | POA: Insufficient documentation

## 2010-07-10 DIAGNOSIS — J449 Chronic obstructive pulmonary disease, unspecified: Secondary | ICD-10-CM | POA: Insufficient documentation

## 2010-07-10 DIAGNOSIS — J069 Acute upper respiratory infection, unspecified: Secondary | ICD-10-CM | POA: Insufficient documentation

## 2010-07-10 DIAGNOSIS — R0602 Shortness of breath: Secondary | ICD-10-CM | POA: Insufficient documentation

## 2010-07-10 DIAGNOSIS — I517 Cardiomegaly: Secondary | ICD-10-CM | POA: Insufficient documentation

## 2010-07-10 DIAGNOSIS — Z853 Personal history of malignant neoplasm of breast: Secondary | ICD-10-CM | POA: Insufficient documentation

## 2010-07-10 DIAGNOSIS — J4489 Other specified chronic obstructive pulmonary disease: Secondary | ICD-10-CM | POA: Insufficient documentation

## 2010-07-10 LAB — GLUCOSE, CAPILLARY: Glucose-Capillary: 102 mg/dL — ABNORMAL HIGH (ref 70–99)

## 2010-08-25 ENCOUNTER — Emergency Department (HOSPITAL_COMMUNITY)
Admission: EM | Admit: 2010-08-25 | Discharge: 2010-08-25 | Disposition: A | Discharge status: Home / Self Care | Payer: PRIVATE HEALTH INSURANCE | Attending: Emergency Medicine | Admitting: Emergency Medicine

## 2010-08-25 DIAGNOSIS — Z853 Personal history of malignant neoplasm of breast: Secondary | ICD-10-CM | POA: Insufficient documentation

## 2010-08-25 DIAGNOSIS — R350 Frequency of micturition: Secondary | ICD-10-CM | POA: Insufficient documentation

## 2010-08-25 DIAGNOSIS — R300 Dysuria: Secondary | ICD-10-CM | POA: Insufficient documentation

## 2010-08-25 LAB — DIFFERENTIAL
Lymphocytes Relative: 60 % — ABNORMAL HIGH (ref 12–46)
Monocytes Absolute: 0.4 10*3/uL (ref 0.1–1.0)

## 2010-08-25 LAB — CBC
MCHC: 32.7 g/dL (ref 30.0–36.0)
MCV: 91.2 fL (ref 78.0–100.0)
RDW: 16.4 % — ABNORMAL HIGH (ref 11.5–15.5)
WBC: 3.1 10*3/uL — ABNORMAL LOW (ref 4.0–10.5)

## 2010-08-25 LAB — URINALYSIS, ROUTINE W REFLEX MICROSCOPIC
Bilirubin Urine: NEGATIVE
Glucose, UA: NEGATIVE mg/dL
Hgb urine dipstick: NEGATIVE
Ketones, ur: NEGATIVE mg/dL
Nitrite: NEGATIVE
pH: 6 (ref 5.0–8.0)

## 2010-08-25 LAB — POCT I-STAT, CHEM 8
Calcium, Ion: 1.16 mmol/L (ref 1.12–1.32)
Glucose, Bld: 101 mg/dL — ABNORMAL HIGH (ref 70–99)
HCT: 36 % (ref 36.0–46.0)
Sodium: 142 mEq/L (ref 135–145)
TCO2: 25 mmol/L (ref 0–100)

## 2010-09-08 ENCOUNTER — Ambulatory Visit: Payer: 59 | Admitting: Internal Medicine

## 2010-09-08 DIAGNOSIS — Z0289 Encounter for other administrative examinations: Secondary | ICD-10-CM

## 2010-09-10 ENCOUNTER — Ambulatory Visit (INDEPENDENT_AMBULATORY_CARE_PROVIDER_SITE_OTHER): Payer: 59 | Admitting: Internal Medicine

## 2010-09-10 ENCOUNTER — Encounter: Payer: Self-pay | Admitting: Internal Medicine

## 2010-09-10 VITALS — BP 124/60 | HR 73 | Temp 97.0°F

## 2010-09-10 DIAGNOSIS — N952 Postmenopausal atrophic vaginitis: Secondary | ICD-10-CM

## 2010-09-10 DIAGNOSIS — M069 Rheumatoid arthritis, unspecified: Secondary | ICD-10-CM

## 2010-09-10 MED ORDER — ESTROGENS, CONJUGATED 0.625 MG/GM VA CREA: TOPICAL_CREAM | VAGINAL | Status: AC

## 2010-09-10 NOTE — Progress Notes (Signed)
  Subjective:    Patient ID: Angela [name], female    DOB: [birth year], 77 y.o.   MRN: [id number]  HPI Mrs. Holck presents for discomfort in the vaginal area. She has been to ED for dysuria (all records reviewed) - negative evaluation, normal labs. She continues to have vaginal discomfort. She reports vaginal discomfort and burning. She will have some pain with urination that is external.    She is having sinus congestion. She has had no fever, she has had no purulent rhinorrhea.   Rash on arms and legs. Not painful or pruritic.  Past Medical History  Diagnosis Date  . Rheumatoid arthritis   . Allergic rhinitis   . Adenocarcinoma of breast   . Anxiety   . Depression   . Anemia    Past Surgical History  Procedure Date  . Masectomy     left breast  . Cataract extraction    Family History  Problem Relation Age of Onset  . Diabetes Mother   . Cancer Father   . Cancer Sister   . Arthritis Son    History   Social History  . Marital Status: Widowed    Spouse Name: N/A    Number of Children: N/A  . Years of Education: N/A   Occupational History  . Not on file.   Social History Main Topics  . Smoking status: Never Smoker   . Smokeless tobacco: Never Used  . Alcohol Use: No  . Drug Use: No  . Sexually Active: No   Other Topics Concern  . Not on file   Social History Narrative   Widow since 1970. 8 children Wheelchair bound due to arthritis.  lives with son       Review of Systems Review of Systems  Constitutional:  Negative for fever, chills, activity change and unexpected weight change.  HEENT:  Negative for hearing loss, ear pain, neck stiffness. Positive for congestion and postnasal drip. Negative for sore throat or swallowing problems. Negative for dental complaints.   Eyes: Negative for vision loss or change in visual acuity.  Respiratory: Negative for chest tightness and wheezing.   Cardiovascular: Negative for chest pain and palpitation.    Gastrointestinal: No change in bowel habit. No bloating or gas. No reflux or indigestion Genitourinary: Negative for urgency, frequency, flank pain and difficulty urinating.  Musculoskeletal: Negative for myalgias, back pain, arthralgias and gait problem.  Neurological: Negative for dizziness, tremors, weakness and headaches.  Hematological: Negative for adenopathy.  Psychiatric/Behavioral: Negative for behavioral problems and dysphoric mood.       Objective:   Physical Exam Vitals noted - stable HEENT- edentulous, no sinus tenderness Chest - good breath sounds w/o rales, wheeze or rhonchi Cor - 2+ radial pulse, RRR        Assessment & Plan:

## 2010-09-10 NOTE — Patient Instructions (Addendum)
Discomfort down below is called atrophic vaginitis - comes with age and dryness. Plan is to use Premarin vaginal cream daily for 10 days and then twice a week.   Sinus congestion - no evidence of bacterial infection today. Plan - take Mucinex 2 tablets in the AM and PM. Also use a nasal saline spray to keep the sinuses moisturized. For drainage you can take loratadine 10 mg once a day - an over the counter antihistamine.  For skin - I don't know what it is. For continued problems will have you see a dermatologist.      Atrophic Vaginitis Atrophic vaginitis is a problem of low levels of estrogen in women.    This problem can happen at any age. It is most common in women who have gone through menopause ("the change"). Menopause happens in women who:  Are ages 44-55.   Have had surgery to remove their ovaries.  It can also happen in women:  Who are breast-feeding.   Using medicines that lowers estrogen levels in the blood.   Who are having radiation or cancer treatments.  HOW WILL I KNOW IF I HAVE THIS PROBLEM? You may have:  Trouble with peeing, such as:   Going to the bathroom often.     A hard time holding the pee till you reach a bathroom.     Leaking pee.     Having pain when you pee.     Itching and/or a burning feeling.   Vaginal bleeding and spotting.     Pain during sex.     Dryness of the vagina.     A yellow, bad smelling discharge coming from the vagina.     HOW WILL MY DOCTOR CHECK FOR THIS PROBLEM?  During your exam, your doctor will likely find the problem.   If there is a vaginal discharge, it may be checked for infection.  HOW WILL THIS PROBLEM BE TREATED?  Keep the vulvar skin as clean as possible. Moisturizers and lubricants can help with some of the symptoms.   Estrogen replacement can help. There are 2 ways to take estrogen:   Systemic Estrogen gets estrogen to your whole body. It takes many weeks or months before the symptoms get better.  -  You take an estrogen pill. - You use a skin Patch. This is a patch that you put on your skin.   - If you still have your uterus, your doctor may ask you to take a hormone. Talk to your doctor about the right medicine for you.  Estrogen Cream. This puts estrogen only at the part of your body where you apply it. The cream is put into the vagina or put on the vulvar skin. For some women, estrogen cream works faster than pills or the patch.  CAN ALL WOMEN WITH THIS PROBLEM USE ESTROGEN? No. Women with certain types of cancer, liver problems, or problems with blood clots should not take estrogen. Your doctor can help you decide the best treatment for your symptoms. Easy-to-Read style based on content from [facility], [place], [place] Document Released: 06/09/2007 Document Re-Released: 10/24/2007 [facility] Patient Information 2011 [place], [place].

## 2010-09-12 ENCOUNTER — Encounter: Payer: Self-pay | Admitting: Internal Medicine

## 2010-09-12 DIAGNOSIS — N952 Postmenopausal atrophic vaginitis: Secondary | ICD-10-CM | POA: Insufficient documentation

## 2010-09-12 NOTE — Assessment & Plan Note (Signed)
Probable atrophic vaginitis by heer description of symptoms and age.  Plan - premarin vaginal cream

## 2010-09-12 NOTE — Assessment & Plan Note (Signed)
On no medication and seems to be doing OK with no inflammation of small or medium joints.

## 2010-11-04 ENCOUNTER — Telehealth: Payer: Self-pay | Admitting: *Deleted

## 2010-11-04 NOTE — Telephone Encounter (Signed)
Patient requesting a call back to discuss jury duty letter she received in the mail.

## 2010-11-05 NOTE — Telephone Encounter (Signed)
Pt is needing a letter stating that she cannot sit for a trial due to her arthritis and age

## 2010-11-05 NOTE — Telephone Encounter (Signed)
Doesn't need a letter - she will be excused by letting clerk of court know her age.

## 2010-11-06 NOTE — Telephone Encounter (Signed)
Informed pt .

## 2011-04-06 ENCOUNTER — Ambulatory Visit (INDEPENDENT_AMBULATORY_CARE_PROVIDER_SITE_OTHER): Payer: PRIVATE HEALTH INSURANCE | Admitting: Internal Medicine

## 2011-04-06 ENCOUNTER — Encounter: Payer: Self-pay | Admitting: Internal Medicine

## 2011-04-06 VITALS — BP 120/82 | HR 68 | Temp 98.5°F | Resp 14

## 2011-04-06 DIAGNOSIS — H5462 Unqualified visual loss, left eye, normal vision right eye: Secondary | ICD-10-CM

## 2011-04-06 DIAGNOSIS — H5460 Unqualified visual loss, one eye, unspecified: Secondary | ICD-10-CM

## 2011-04-06 DIAGNOSIS — Z515 Encounter for palliative care: Secondary | ICD-10-CM

## 2011-04-06 DIAGNOSIS — J3089 Other allergic rhinitis: Secondary | ICD-10-CM

## 2011-04-06 NOTE — Assessment & Plan Note (Signed)
Symptoms are flaring. No sign of infection at today's exam.  Plan - loratadine 10 mg once a day.

## 2011-04-06 NOTE — Patient Instructions (Signed)
Sinus drainage - no sign of infection. This appears to be allergic rhinitis. Plan - over the counter loratadine (claritin) 10 mg once a day.  Loss of vision - this may be an advanced cataract vs other. Plan - referral to [facility] for exam and treatment.   Allergic Rhinitis Allergic rhinitis is when the mucous membranes in the nose respond to allergens. Allergens are particles in the air that cause your body to have an allergic reaction. This causes you to release allergic antibodies. Through a chain of events, these eventually cause you to release histamine into the blood stream (hence the use of antihistamines). Although meant to be protective to the body, it is this release that causes your discomfort, such as frequent sneezing, congestion and an itchy runny nose.   CAUSES   The pollen allergens may come from grasses, trees, and weeds. This is seasonal allergic rhinitis, or "hay fever." Other allergens cause year-round allergic rhinitis (perennial allergic rhinitis) such as house dust mite allergen, pet dander and mold spores.   SYMPTOMS    Nasal stuffiness (congestion).   Runny, itchy nose with sneezing and tearing of the eyes.   There is often an itching of the mouth, eyes and ears.  It cannot be cured, but it can be controlled with medications. DIAGNOSIS   If you are unable to determine the offending allergen, skin or blood testing may find it. TREATMENT    Avoid the allergen.   Medications and allergy shots (immunotherapy) can help.   Hay fever may often be treated with antihistamines in pill or nasal spray forms. Antihistamines block the effects of histamine. There are over-the-counter medicines that may help with nasal congestion and swelling around the eyes. Check with your caregiver before taking or giving this medicine.  If the treatment above does not work, there are many new medications your caregiver can prescribe. Stronger medications may be used if initial measures  are ineffective. Desensitizing injections can be used if medications and avoidance fails. Desensitization is when a patient is given ongoing shots until the body becomes less sensitive to the allergen. Make sure you follow up with your caregiver if problems continue. SEEK MEDICAL CARE IF:    You develop fever (more than 100.5 F (38.1 C).   You develop a cough that does not stop easily (persistent).   You have shortness of breath.   You start wheezing.   Symptoms interfere with normal daily activities.  Document Released: 09/15/2000 Document Revised: 12/10/2010 Document Reviewed: 03/27/2008 [facility] Patient Information 2012 [place], [place].

## 2011-04-06 NOTE — Assessment & Plan Note (Signed)
Patient with loss of vision OS. On exam - opacified lens left. Cataract vs. Other.  Plan - [facility] she [facility] for cataract in the past.

## 2011-04-06 NOTE — Progress Notes (Signed)
  Subjective:    Patient ID: Angela [name], female    DOB: 1918-03-04, 78 y.o.   MRN: [id number]  HPI Angela [name] presents for loss of vision left eye. She had cataract extraction OD years ago. OS had cataract but it wasn't "ripe" for extraction. She reports that the loss of vision seems of rather recent onset. NO pain in the eye, no injury.   She has a lot of rhinorrhea. Mucus has been clear, no fever, no cough except from drainage. No SOB. She has not taken any medication.   Past Medical History  Diagnosis Date  . Rheumatoid arthritis   . Allergic rhinitis   . Adenocarcinoma of breast   . Anxiety   . Depression   . Anemia    Past Surgical History  Procedure Date  . Masectomy     left breast  . Cataract extraction    Family History  Problem Relation Age of Onset  . Diabetes Mother   . Cancer Father   . Cancer Sister   . Arthritis Son    History   Social History  . Marital Status: Widowed    Spouse Name: N/A    Number of Children: N/A  . Years of Education: N/A   Occupational History  . Not on file.   Social History Main Topics  . Smoking status: Former Games developer  . Smokeless tobacco: Never Used  . Alcohol Use: No  . Drug Use: No  . Sexually Active: No   Other Topics Concern  . Not on file   Social History Narrative   Widow since 1970. 8 children Wheelchair bound due to arthritis.  lives with son       Review of Systems System review is negative for any constitutional, cardiac, pulmonary, GI or neuro symptoms or complaints other than as described in the HPI.     Objective:   Physical Exam Filed Vitals:   04/06/11 1617  BP: 120/82  Pulse: 68  Temp: 98.5 F (36.9 C)  Resp: 14   Wt Readings from Last 3 Encounters:  12/22/09 120 lb (54.432 kg)  08/05/09 110 lb (49.896 kg)  07/24/09 110 lb (49.896 kg)   Gen'l- very thin old AA woman in no acute distress HEENT - poor dentition, C&S clear, left lens totally opacified - no vision. Right lens is  clear and visual acuity is normal Chest - clear Cor - RRR with PVCs, III/VI systolic Murmur best at RSB            Assessment & Plan:  ACP - discussed end of life issues: CPR, intubation, HCPOA, etc/. Provided packet.  (greater than 50% of 30 min visit spent on education and counseling)

## 2011-08-04 ENCOUNTER — Emergency Department (INDEPENDENT_AMBULATORY_CARE_PROVIDER_SITE_OTHER)
Admission: EM | Admit: 2011-08-04 | Discharge: 2011-08-04 | Disposition: A | Discharge status: Home / Self Care | Payer: Medicaid Other | Source: Home / Self Care

## 2011-08-04 ENCOUNTER — Encounter (HOSPITAL_COMMUNITY): Payer: Self-pay | Admitting: *Deleted

## 2011-08-04 DIAGNOSIS — L8990 Pressure ulcer of unspecified site, unspecified stage: Secondary | ICD-10-CM

## 2011-08-04 DIAGNOSIS — L8992 Pressure ulcer of unspecified site, stage 2: Secondary | ICD-10-CM

## 2011-08-04 DIAGNOSIS — L89152 Pressure ulcer of sacral region, stage 2: Secondary | ICD-10-CM

## 2011-08-04 DIAGNOSIS — L89109 Pressure ulcer of unspecified part of back, unspecified stage: Secondary | ICD-10-CM

## 2011-08-04 NOTE — ED Notes (Addendum)
Pt  Has    A sore On  Bottom  Area  Since last  Week  She  Is  Sitting   Upright  On  Exam table          Speaking  In  Complete  sentances   She     Seems   Very  Alert  For  Her  Age   She  Is  Sitting  Upright oin wheelchair        Her  Son  Brought  Her to  The  Clinic

## 2011-08-04 NOTE — ED Provider Notes (Signed)
History     CSN: [id number]  Arrival date & time 08/04/11  1647   First MD Initiated Contact with Patient 08/04/11 1656      Chief Complaint  Patient presents with  . Rash    (Consider location/radiation/quality/duration/timing/severity/associated sxs/prior treatment) HPI Comments: This seems more likely that it was originally a blister as per patient, she relates she spent the whole day sitting in her wheelchair as she does have contracture of her feet and cannot be standing up. His only mildly tender to palpation is not erythematous or any drainage  Patient is a 78 y.o. female presenting with rash. The history is provided by the patient.  Rash  This is a new problem. The current episode started more than 2 days ago. The problem is associated with nothing. There has been no fever. The rash is present on the back.    Past Medical History  Diagnosis Date  . Rheumatoid arthritis   . Allergic rhinitis   . Adenocarcinoma of breast   . Anxiety   . Depression   . Anemia     Past Surgical History  Procedure Date  . Masectomy     left breast  . Cataract extraction     Family History  Problem Relation Age of Onset  . Diabetes Mother   . Cancer Father   . Cancer Sister   . Arthritis Son     History  Substance Use Topics  . Smoking status: Former Games developer  . Smokeless tobacco: Never Used  . Alcohol Use: No    OB History    Grav Para Term Preterm Abortions TAB SAB Ect Mult Living                  Review of Systems  Constitutional: Negative.   Skin: Positive for rash.    Allergies  Penicillins  Home Medications   Current Outpatient Rx  Name Route Sig Dispense Refill  . ESTROGENS, CONJUGATED 0.625 MG/GM VA CREA  Use daily for 10- days and then twice a week 42.5 g 12    BP 148/62  Pulse 70  Temp 97.9 F (36.6 C) (Oral)  Resp 20  SpO2 97%  Physical Exam  Nursing note and vitals reviewed. Constitutional: Vital signs are normal. She appears  well-developed.  Non-toxic appearance. She does not have a sickly appearance. She does not appear ill. No distress.    Skin: She is not diaphoretic.       ED Course  Procedures (including critical care time)  Labs Reviewed - No data to display No results found.   No diagnosis found.    MDM  Patient has been spending the whole day on the chair without being able to get up secondary to contractures. It is about 2 cm pressure ulcer which has healing borders, no drainage or purulent material. She has not been laying on her feces or urine as she is able to use the bathroom with the nurse that she has at home. Have instructed her to try to move at least every 2 hours for this to heal well. Have also advised her to apply Vaseline when she cleans the area is to help with the healing.        Marinda Elk, MD 08/04/11 1745

## 2011-08-10 ENCOUNTER — Ambulatory Visit (INDEPENDENT_AMBULATORY_CARE_PROVIDER_SITE_OTHER): Payer: PRIVATE HEALTH INSURANCE | Admitting: Internal Medicine

## 2011-08-10 ENCOUNTER — Encounter: Payer: Self-pay | Admitting: Internal Medicine

## 2011-08-10 VITALS — BP 140/80 | HR 70 | Temp 97.4°F | Resp 14

## 2011-08-10 DIAGNOSIS — L8990 Pressure ulcer of unspecified site, unspecified stage: Secondary | ICD-10-CM

## 2011-08-10 DIAGNOSIS — J3089 Other allergic rhinitis: Secondary | ICD-10-CM

## 2011-08-10 MED ORDER — CETIRIZINE HCL 10 MG PO TABS: 10.0000 mg | ORAL_TABLET | Freq: Every day | ORAL | Status: DC

## 2011-08-10 NOTE — Assessment & Plan Note (Signed)
She will try zyrtec for symptom relief

## 2011-08-10 NOTE — Progress Notes (Signed)
Subjective:    Patient ID: Angela [name], female    DOB: 08-28-1918, 78 y.o.   MRN: [id number]  HPI  New to me she complains of sores on her butt from sitting in a wheelchair all day. She has severe knee DJD and tells me that she can't walk anymore. She wants to get a [facility] nurse to help her put a salve on the area. Also, she has nasal allergies and wants a medicine for that.  Review of Systems  Constitutional: Negative for fever, chills, diaphoresis, activity change, appetite change, fatigue and unexpected weight change.  HENT: Positive for congestion, rhinorrhea and postnasal drip. Negative for hearing loss, ear pain, nosebleeds, sore throat, facial swelling, sneezing, drooling, mouth sores, trouble swallowing, neck pain, neck stiffness, dental problem, voice change, sinus pressure, tinnitus and ear discharge.   Eyes: Negative.   Respiratory: Negative for cough, chest tightness, shortness of breath, wheezing and stridor.   Cardiovascular: Negative for chest pain.  Gastrointestinal: Negative.   Genitourinary: Negative for dysuria, frequency, hematuria, flank pain, enuresis, difficulty urinating and dyspareunia.  Musculoskeletal: Positive for arthralgias. Negative for myalgias, back pain, joint swelling and gait problem.  Skin: Positive for wound (on her buttocks). Negative for color change, pallor and rash.  Neurological: Negative for dizziness, tremors, seizures, syncope, facial asymmetry, speech difficulty, weakness, light-headedness, numbness and headaches.  Hematological: Negative for adenopathy. Does not bruise/bleed easily.  Psychiatric/Behavioral: Negative.        Objective:   Physical Exam  Vitals reviewed. Constitutional: She is oriented to person, place, and time. She appears well-developed and well-nourished.  Non-toxic appearance. She does not have a sickly appearance. She appears ill (wheelchair bound). No distress.  HENT:  Head: Normocephalic and atraumatic.  Nose:  Mucosal edema present. No rhinorrhea, nose lacerations, sinus tenderness, nasal deformity, septal deviation or nasal septal hematoma. No epistaxis.  No foreign bodies. Right sinus exhibits no maxillary sinus tenderness and no frontal sinus tenderness. Left sinus exhibits no maxillary sinus tenderness and no frontal sinus tenderness.  Mouth/Throat: Oropharynx is clear and moist. No oropharyngeal exudate.  Eyes: Conjunctivae are normal. Right eye exhibits no discharge. Left eye exhibits no discharge. No scleral icterus.  Neck: Normal range of motion. Neck supple. No JVD present. No tracheal deviation present. No thyromegaly present.  Cardiovascular: Normal rate, regular rhythm and intact distal pulses.  Exam reveals no gallop and no friction rub.   Murmur heard. Pulmonary/Chest: Effort normal and breath sounds normal. No stridor. No respiratory distress. She has no wheezes. She has no rales. She exhibits no tenderness.  Abdominal: Soft. Bowel sounds are normal. She exhibits no distension and no mass. There is no tenderness. There is no rebound and no guarding.  Musculoskeletal: Normal range of motion. She exhibits no edema and no tenderness.  Lymphadenopathy:    She has no cervical adenopathy.  Neurological: She is oriented to person, place, and time.  Skin: Skin is warm and dry. Abrasion noted. No bruising, no burn, no ecchymosis, no laceration, no lesion and no rash noted. She is not diaphoretic. No cyanosis or erythema. No pallor. Nails show no clubbing.     Psychiatric: She has a normal mood and affect. Her behavior is normal. Judgment and thought content normal.      Lab Results  Component Value Date   WBC 3.1* 08/25/2010   HGB 12.2 08/25/2010   HCT 36.0 08/25/2010   PLT 312 08/25/2010   GLUCOSE 101* 08/25/2010   CHOL 121 06/10/2004   TRIG 75  06/10/2004   HDL 30.0* 06/10/2004   LDLCALC 76 CALC 06/10/2004   ALT 17 08/05/2009   AST 27 08/05/2009   NA 142 08/25/2010   K 3.7 08/25/2010   CL 107  08/25/2010   CREATININE 0.80 08/25/2010   BUN 23 08/25/2010   CO2 27 08/05/2009   TSH 1.86 08/05/2009      Assessment & Plan:

## 2011-08-10 NOTE — Patient Instructions (Signed)
Allergic Rhinitis  Allergic rhinitis is when the mucous membranes in the nose respond to allergens. Allergens are particles in the air that cause your body to have an allergic reaction. This causes you to release allergic antibodies. Through a chain of events, these eventually cause you to release histamine into the blood stream (hence the use of antihistamines). Although meant to be protective to the body, it is this release that causes your discomfort, such as frequent sneezing, congestion and an itchy runny nose.    CAUSES    The pollen allergens may come from grasses, trees, and weeds. This is seasonal allergic rhinitis, or "hay fever." Other allergens cause year-round allergic rhinitis (perennial allergic rhinitis) such as house dust mite allergen, pet dander and mold spores.    SYMPTOMS     Nasal stuffiness (congestion).   Runny, itchy nose with sneezing and tearing of the eyes.   There is often an itching of the mouth, eyes and ears.  It cannot be cured, but it can be controlled with medications.  DIAGNOSIS    If you are unable to determine the offending allergen, skin or blood testing may find it.  TREATMENT     Avoid the allergen.   Medications and allergy shots (immunotherapy) can help.   Hay fever may often be treated with antihistamines in pill or nasal spray forms. Antihistamines block the effects of histamine. There are over-the-counter medicines that may help with nasal congestion and swelling around the eyes. Check with your caregiver before taking or giving this medicine.  If the treatment above does not work, there are many new medications your caregiver can prescribe. Stronger medications may be used if initial measures are ineffective. Desensitizing injections can be used if medications and avoidance fails. Desensitization is when a patient is given ongoing shots until the body becomes less sensitive to the allergen. Make sure you follow up with your caregiver if problems continue.  SEEK  MEDICAL CARE IF:     You develop fever (more than 100.5 F (38.1 C).   You develop a cough that does not stop easily (persistent).   You have shortness of breath.   You start wheezing.   Symptoms interfere with normal daily activities.  Document Released: 09/15/2000 Document Revised: 12/10/2010 Document Reviewed: 03/27/2008  [employer] Patient Information 2012 [employer].

## 2011-08-10 NOTE — Assessment & Plan Note (Signed)
I have put in an order for [facility] with wound care

## 2011-08-12 ENCOUNTER — Telehealth: Payer: Self-pay

## 2011-08-12 DIAGNOSIS — L8990 Pressure ulcer of unspecified site, unspecified stage: Secondary | ICD-10-CM

## 2011-08-12 MED ORDER — DUODERM HYDROACTIVE EX GEL: 1.0000 "application " | Freq: Every day | CUTANEOUS | Status: DC

## 2011-08-12 NOTE — Telephone Encounter (Signed)
Pt advised of Rx/pharmacy 

## 2011-08-12 NOTE — Telephone Encounter (Signed)
Pt called stating that she though MD would be prescribing medication for ulcer on her bottom. Pt says that she is still experiencing pain and would like topical Rx to treat, please advise.

## 2011-08-12 NOTE — Telephone Encounter (Signed)
Per pt and pt's daughter, they have not been contacted by [facility] agency yet and pt is in pain. Pt is requesting Rx to pharmacy until [facility] visit.

## 2011-08-12 NOTE — Telephone Encounter (Signed)
Try duoderm

## 2011-08-12 NOTE — Telephone Encounter (Signed)
I have asked [facility] to supply and apply the cream for her decub ulcers

## 2011-09-10 DIAGNOSIS — L89309 Pressure ulcer of unspecified buttock, unspecified stage: Secondary | ICD-10-CM

## 2011-09-10 DIAGNOSIS — L8992 Pressure ulcer of unspecified site, stage 2: Secondary | ICD-10-CM

## 2011-09-10 DIAGNOSIS — Z4800 Encounter for change or removal of nonsurgical wound dressing: Secondary | ICD-10-CM

## 2011-09-10 DIAGNOSIS — M1710 Unilateral primary osteoarthritis, unspecified knee: Secondary | ICD-10-CM

## 2011-11-12 ENCOUNTER — Emergency Department (INDEPENDENT_AMBULATORY_CARE_PROVIDER_SITE_OTHER): Payer: PRIVATE HEALTH INSURANCE

## 2011-11-12 ENCOUNTER — Emergency Department (INDEPENDENT_AMBULATORY_CARE_PROVIDER_SITE_OTHER)
Admission: EM | Admit: 2011-11-12 | Discharge: 2011-11-12 | Disposition: A | Discharge status: Home / Self Care | Payer: PRIVATE HEALTH INSURANCE | Source: Home / Self Care | Attending: Family Medicine | Admitting: Family Medicine

## 2011-11-12 ENCOUNTER — Encounter (HOSPITAL_COMMUNITY): Payer: Self-pay | Admitting: Emergency Medicine

## 2011-11-12 DIAGNOSIS — J329 Chronic sinusitis, unspecified: Secondary | ICD-10-CM

## 2011-11-12 DIAGNOSIS — H6120 Impacted cerumen, unspecified ear: Secondary | ICD-10-CM

## 2011-11-12 DIAGNOSIS — J3089 Other allergic rhinitis: Secondary | ICD-10-CM

## 2011-11-12 DIAGNOSIS — J309 Allergic rhinitis, unspecified: Secondary | ICD-10-CM

## 2011-11-12 MED ORDER — FLUTICASONE PROPIONATE 50 MCG/ACT NA SUSP: 2.0000 | Freq: Every day | NASAL | Status: DC

## 2011-11-12 MED ORDER — NEOMYCIN-POLYMYXIN-HC 3.5-10000-1 OT SUSP: 4.0000 [drp] | Freq: Three times a day (TID) | OTIC | Status: DC

## 2011-11-12 MED ORDER — DOXYCYCLINE HYCLATE 100 MG PO CAPS: 100.0000 mg | ORAL_CAPSULE | Freq: Two times a day (BID) | ORAL | Status: DC

## 2011-11-12 MED ORDER — GUAIFENESIN-CODEINE 100-10 MG/5ML PO SYRP: 5.0000 mL | ORAL_SOLUTION | Freq: Three times a day (TID) | ORAL | Status: DC | PRN

## 2011-11-12 NOTE — ED Provider Notes (Signed)
History     CSN: [id number]  Arrival date & time 11/12/11  1343   First MD Initiated Contact with Patient 11/12/11 1618      Chief Complaint  Patient presents with  . URI    (Consider location/radiation/quality/duration/timing/severity/associated sxs/prior treatment) HPI Comments: 78 year old former smoker female with history of allergic rhinitis among other comorbidities. Here complaining of worsening nasal congestion, sinus pressure and thick green rhinorrhea. Symptoms also associated with nonproductive cough and headache. Denies fever or chills. Patient denies shortness of breath, chest pain or wheezing. Patient mentation is at baseline as per son. Her appetite and activity level also at baseline. She is here with her son.   Past Medical History  Diagnosis Date  . Rheumatoid arthritis   . Allergic rhinitis   . Adenocarcinoma of breast   . Anxiety   . Depression   . Anemia     Past Surgical History  Procedure Date  . Masectomy     left breast  . Cataract extraction     Family History  Problem Relation Age of Onset  . Diabetes Mother   . Cancer Father   . Cancer Sister   . Arthritis Son     History  Substance Use Topics  . Smoking status: Former Games developer  . Smokeless tobacco: Never Used  . Alcohol Use: No    OB History    Grav Para Term Preterm Abortions TAB SAB Ect Mult Living                  Review of Systems  Constitutional: Negative for fever, chills, diaphoresis, activity change, appetite change and fatigue.  HENT: Positive for ear pain, congestion, sore throat, rhinorrhea, sneezing and sinus pressure. Negative for facial swelling, trouble swallowing, neck pain and voice change.   Eyes: Positive for itching. Negative for discharge.  Respiratory: Negative for cough, chest tightness, shortness of breath and wheezing.   Cardiovascular: Negative for chest pain, palpitations and leg swelling.  Gastrointestinal: Negative for nausea, vomiting, abdominal  pain and diarrhea.  Musculoskeletal: Negative for myalgias and arthralgias.  Skin: Negative for rash.  Neurological: Negative for dizziness and headaches.  All other systems reviewed and are negative.    Allergies  Penicillins  Home Medications   Current Outpatient Rx  Name  Route  Sig  Dispense  Refill  . CETIRIZINE HCL 10 MG PO TABS   Oral   Take 1 tablet (10 mg total) by mouth daily.   30 tablet   11   . DOXYCYCLINE HYCLATE 100 MG PO CAPS   Oral   Take 1 capsule (100 mg total) by mouth 2 (two) times daily.   20 capsule   0   . FLUTICASONE PROPIONATE 50 MCG/ACT NA SUSP   Nasal   Place 2 sprays into the nose daily.   16 g   0   . GUAIFENESIN-CODEINE 100-10 MG/5ML PO SYRP   Oral   Take 5 mLs by mouth 3 (three) times daily as needed for cough.   120 mL   0   . DUODERM HYDROACTIVE EX GEL   Apply externally   Apply 1 application topically daily.   15 g   0   . NEOMYCIN-POLYMYXIN-HC 3.5-10000-1 OT SUSP   Both Ears   Place 4 drops into both ears 3 (three) times daily. Used as directed for 5 days   10 mL   0     BP 136/70  Pulse 66  Temp 97.4 F (36.3 C) (Oral)  Resp 22  SpO2 99%  Physical Exam  Nursing note and vitals reviewed. Constitutional: She is oriented to person, place, and time. She appears well-developed and well-nourished. No distress.  HENT:  Head: Normocephalic and atraumatic.       Nasal Congestion with erythema and swelling of nasal turbinates, thick rhinorrhea with crusting. Significant pharyngeal erythema no exudates. No uvula deviation. No trismus. Bilateral cerumen impact. Right ear canal with minimal abrasions and normal TM after wax plug removed by irrigation. Left ear canal with minimal abrasions and bleeding and normal TM after wax plug removed by irrigation.    Eyes: Conjunctivae normal and EOM are normal. Right eye exhibits no discharge. Left eye exhibits no discharge.       Clear eye tearing bilaterally mild conjunctival  injection in both eyes. Left eye cataract with complete opacity of the pupil.  Neck: Neck supple. No JVD present. No tracheal deviation present.  Cardiovascular: Regular rhythm, normal heart sounds and intact distal pulses.        Systolic 3/4/6 ejection murmur.  Pulmonary/Chest: Effort normal and breath sounds normal. No respiratory distress. She has no wheezes. She has no rales. She exhibits no tenderness.       No tachypnea or orthopnea. Appears breathing comfortably.  Abdominal: Soft. There is no tenderness.  Lymphadenopathy:    She has no cervical adenopathy.  Neurological: She is alert and oriented to person, place, and time.       Responds appropriately to questions.  Skin: No rash noted. She is not diaphoretic.    ED Course  Procedures (including critical care time)  Labs Reviewed - No data to display Dg Chest 2 View  11/12/2011  *RADIOLOGY REPORT*  Clinical Data: Cough, congestion  CHEST - 2 VIEW  Comparison: 07/10/2010  Findings: Cardiomediastinal silhouette is stable.  Thoracic dextroscoliosis again noted.  Again noted status post left mastectomy and left axillary lymph node dissection.  Mild hyperinflation again noted. No acute infiltrate or pulmonary edema.  IMPRESSION: No active disease.  Hyperinflation again noted. Stable thoracic dextroscoliosis.   Original Report Authenticated By: Natasha Mead, M.D.      1. Sinusitis   2. Allergic rhinitis   3. Cerumen impaction    EKG: Normal sinus rhythm with a ventricular rate at 65 beats per minutes. There are Q waves in V1 V2 and V3 with no ST changes some T-wave flattening inferior leads. Changes are not new and are similar to EKG performed on 07/19/2009.   MDM  78 year old female with history of allergic rhinitis. Impress allergic tinnitus with over imposed possible bacterial sinusitis. Prescribed Flonase, doxycycline, guaifenesin with codeine and Tylenol.    abnormal EKG with findings consistent with a possible old  anteroseptal infarct no new changes in EKG today these finding have been present at least since prior EKG used for comparison from 07/19/2009. Patient is asymptomatic. Findings discussed with her son and he feels comfortable taking patient home. Imade son and patient  aware that patient may have had a MI in the past and encouraged to call 911 if patient is full code and develops chest pain, discomfort or shortness of breath. Recommended close followup with her primary care provider for monitoring her symptoms and to discuss advanced directives.      Sharin Grave, MD 11/14/11 [phone]

## 2011-11-12 NOTE — ED Notes (Signed)
Reports uri, onset one week ago.  Head congestion/fullness, frequent blowing of nose, no sore throat, cough.

## 2011-11-12 NOTE — ED Notes (Signed)
Irrigation of ears completed, patient now ready for discharge, no orders pending

## 2011-11-30 ENCOUNTER — Ambulatory Visit: Payer: PRIVATE HEALTH INSURANCE | Admitting: Internal Medicine

## 2012-01-20 ENCOUNTER — Ambulatory Visit: Payer: PRIVATE HEALTH INSURANCE | Admitting: Internal Medicine

## 2012-04-20 ENCOUNTER — Encounter: Payer: Self-pay | Admitting: Internal Medicine

## 2012-04-20 ENCOUNTER — Other Ambulatory Visit (INDEPENDENT_AMBULATORY_CARE_PROVIDER_SITE_OTHER): Payer: PRIVATE HEALTH INSURANCE

## 2012-04-20 ENCOUNTER — Ambulatory Visit (INDEPENDENT_AMBULATORY_CARE_PROVIDER_SITE_OTHER): Payer: Medicaid Other | Admitting: Internal Medicine

## 2012-04-20 VITALS — BP 120/70 | HR 67 | Temp 98.1°F

## 2012-04-20 DIAGNOSIS — R5381 Other malaise: Secondary | ICD-10-CM

## 2012-04-20 DIAGNOSIS — R5383 Other fatigue: Secondary | ICD-10-CM

## 2012-04-20 DIAGNOSIS — R300 Dysuria: Secondary | ICD-10-CM

## 2012-04-20 DIAGNOSIS — J3089 Other allergic rhinitis: Secondary | ICD-10-CM

## 2012-04-20 LAB — URINALYSIS, ROUTINE W REFLEX MICROSCOPIC
Bilirubin Urine: NEGATIVE
Nitrite: POSITIVE
Total Protein, Urine: NEGATIVE
Urine Glucose: NEGATIVE
pH: 6.5 (ref 5.0–8.0)

## 2012-04-20 LAB — HEPATIC FUNCTION PANEL
ALT: 14 U/L (ref 0–35)
Bilirubin, Direct: 0.1 mg/dL (ref 0.0–0.3)
Total Bilirubin: 0.5 mg/dL (ref 0.3–1.2)

## 2012-04-20 LAB — CBC WITH DIFFERENTIAL/PLATELET
Basophils Relative: 1.1 % (ref 0.0–3.0)
Eosinophils Relative: 2.1 % (ref 0.0–5.0)
Hemoglobin: 11.3 g/dL — ABNORMAL LOW (ref 12.0–15.0)
MCHC: 33.5 g/dL (ref 30.0–36.0)
MCV: 90.7 fl (ref 78.0–100.0)
Monocytes Absolute: 0.3 10*3/uL (ref 0.1–1.0)
Neutro Abs: 0.7 10*3/uL — ABNORMAL LOW (ref 1.4–7.7)
Neutrophils Relative %: 28.4 % — ABNORMAL LOW (ref 43.0–77.0)
RBC: 3.71 Mil/uL — ABNORMAL LOW (ref 3.87–5.11)
WBC: 2.5 10*3/uL — ABNORMAL LOW (ref 4.5–10.5)

## 2012-04-20 LAB — BASIC METABOLIC PANEL
BUN: 19 mg/dL (ref 6–23)
Calcium: 8.8 mg/dL (ref 8.4–10.5)
Creatinine, Ser: 0.8 mg/dL (ref 0.4–1.2)
GFR: 91.49 mL/min (ref >60.00)

## 2012-04-20 LAB — SEDIMENTATION RATE: Sed Rate: 86 mm/hr — ABNORMAL HIGH (ref 0–22)

## 2012-04-20 MED ORDER — METHYLPREDNISOLONE ACETATE 80 MG/ML IJ SUSP
80.0000 mg | Freq: Once | INTRAMUSCULAR | Status: AC
  Administered 2012-04-20: 80 mg via INTRAMUSCULAR

## 2012-04-20 MED ORDER — LEVOFLOXACIN 250 MG PO TABS: 250.0000 mg | ORAL_TABLET | Freq: Every day | ORAL | Status: DC

## 2012-04-20 MED ORDER — FLUTICASONE PROPIONATE 50 MCG/ACT NA SUSP: 2.0000 | Freq: Every day | NASAL | Status: DC

## 2012-04-20 MED ORDER — CETIRIZINE HCL 10 MG PO TABS: 10.0000 mg | ORAL_TABLET | Freq: Every day | ORAL | Status: DC

## 2012-04-20 NOTE — Assessment & Plan Note (Signed)
Ran out of meds recently, for depomedrol IM today, and re-start meds, .follwoj

## 2012-04-20 NOTE — Progress Notes (Signed)
  Subjective:    Patient ID: Angela [name], female    DOB: 05-13-1918, 79 y.o.   MRN: [id number]  HPI Here to f/u with actue; Has had 2-3 days urinary symptoms wtih dysuria, frequency, but no urgency, flank pain, hematuria or n/v, fever, chills or hematuria.  Does have several wks ongoing nasal allergy symptoms with clearish congestion, itch and sneezing, without fever, pain, ST, cough, swelling or wheezing.  Does c/o ongoing fatigue, but denies signficant daytime hypersomnolence.  Pt denies chest pain, increased sob or doe, wheezing, orthopnea, PND, increased LE swelling, palpitations, dizziness or syncope. Past Medical History  Diagnosis Date  . Rheumatoid arthritis   . Allergic rhinitis   . Adenocarcinoma of breast   . Anxiety   . Depression   . Anemia    Past Surgical History  Procedure Laterality Date  . Masectomy      left breast  . Cataract extraction      reports that she has quit smoking. She has never used smokeless tobacco. She reports that she does not drink alcohol or use illicit drugs. family history includes Arthritis in her son; Cancer in her father and sister; and Diabetes in her mother. Allergies  Allergen Reactions  . Penicillins     REACTION: Question of penicillin allergy   Current Outpatient Prescriptions on File Prior to Visit  Medication Sig Dispense Refill  . guaiFENesin-codeine (ROBITUSSIN AC) 100-10 MG/5ML syrup Take 5 mLs by mouth 3 (three) times daily as needed for cough.  120 mL  0  . neomycin-polymyxin-hydrocortisone (CORTISPORIN) 3.5-10000-1 otic suspension Place 4 drops into both ears 3 (three) times daily. Used as directed for 5 days  10 mL  0   No current facility-administered medications on file prior to visit.    Review of Systems  Constitutional: Negative for unexpected weight change, or unusual diaphoresis  HENT: Negative for tinnitus.   Eyes: Negative for photophobia and visual disturbance.  Respiratory: Negative for choking and stridor.    Gastrointestinal: Negative for vomiting and blood in stool.  Genitourinary: Negative for hematuria and decreased urine volume.  Musculoskeletal: Negative for acute joint swelling Skin: Negative for color change and wound.  Neurological: Negative for tremors and numbness other than noted  Psychiatric/Behavioral: Negative for decreased concentration or  hyperactivity.       Objective:   Physical Exam BP 120/70  Pulse 67  Temp(Src) 98.1 F (36.7 C) (Oral) VS noted, mild ill Constitutional: Pt appears well-developed and well-nourished.  HENT: Head: NCAT.  Right Ear: External ear normal.  Left Ear: External ear normal.  Eyes: Conjunctivae and EOM are normal. Pupils are equal, round, and reactive to light.  Neck: Normal range of motion. Neck supple.  Cardiovascular: Normal rate and regular rhythm.   Pulmonary/Chest: Effort normal and breath sounds normal.  Abd:  Soft,, non-distended, + BS with low mid abd tender, no guarding, no flank tender Neurological: Pt is alert. Not confused  Skin: Skin is warm. No erythema.  Psychiatric: Pt behavior is normal. Thought content normal.     Assessment & Plan:

## 2012-04-20 NOTE — Patient Instructions (Addendum)
You had the steroid shot today Please take all new medication as prescribed - the antibiotic Please re-start your allergy medications (the flonase and zyrtec) You can also take OTC Mucinex (or it's generic off brand) for congestion and ear fullness Please go to the LAB in the Basement (turn left off the elevator) for the tests to be done today You will be contacted by phone if any changes need to be made immediately.  Otherwise, you will receive a letter about your results with an explanation Please remember to sign up for My Chart if you have not done so, as this will be important to you in the future with finding out test results, communicating by private email, and scheduling acute appointments online when needed.

## 2012-04-20 NOTE — Assessment & Plan Note (Signed)
Etiology unclear, Exam otherwise benign, to check labs as documented, follow with expectant management  

## 2012-04-20 NOTE — Assessment & Plan Note (Signed)
posbile cystitis, for urine studies, antibx tx pending cx results

## 2012-04-21 ENCOUNTER — Encounter: Payer: Self-pay | Admitting: Internal Medicine

## 2012-10-08 ENCOUNTER — Emergency Department (INDEPENDENT_AMBULATORY_CARE_PROVIDER_SITE_OTHER)
Admission: EM | Admit: 2012-10-08 | Discharge: 2012-10-08 | Disposition: A | Discharge status: Home / Self Care | Payer: PRIVATE HEALTH INSURANCE | Source: Home / Self Care | Attending: Emergency Medicine | Admitting: Emergency Medicine

## 2012-10-08 ENCOUNTER — Encounter (HOSPITAL_COMMUNITY): Payer: Self-pay | Admitting: *Deleted

## 2012-10-08 DIAGNOSIS — R300 Dysuria: Secondary | ICD-10-CM

## 2012-10-08 LAB — POCT URINALYSIS DIP (DEVICE)
Hgb urine dipstick: NEGATIVE
Nitrite: NEGATIVE
Protein, ur: NEGATIVE mg/dL
Specific Gravity, Urine: 1.02 (ref 1.005–1.030)
Urobilinogen, UA: 0.2 mg/dL (ref 0.0–1.0)

## 2012-10-08 MED ORDER — PHENAZOPYRIDINE HCL 200 MG PO TABS: 200.0000 mg | ORAL_TABLET | Freq: Three times a day (TID) | ORAL | Status: DC | PRN

## 2012-10-08 MED ORDER — CIPROFLOXACIN HCL 250 MG PO TABS: 250.0000 mg | ORAL_TABLET | Freq: Two times a day (BID) | ORAL | Status: DC

## 2012-10-08 NOTE — ED Notes (Signed)
Patient complains of dysuria and constant pain for past week. Pain in vaginal area. States pain after eating breakfast this morning; states she is real tender in groin area.

## 2012-10-08 NOTE — ED Provider Notes (Signed)
Chief Complaint:   Chief Complaint  Patient presents with  . Dysuria    History of Present Illness:   Angela [name] is a delightful, mentally sharp, 79 year old female who complains today of a one-week history of burning with urination. She denies any frequency, urgency, or hematuria. She has mild pain in her lower back and lower abdomen. She denies any fever, chills, nausea, vomiting, or anorexia. She has had no vaginal discharge, bleeding, or itching. She's had no prior history of urinary tract infections in the past.  Review of Systems:  Other than noted above, the patient denies any of the following symptoms: General:  No fevers, chills, sweats, aches, or fatigue. GI:  No abdominal pain, back pain, nausea, vomiting, diarrhea, or constipation. GU:  No dysuria, frequency, urgency, hematuria, or incontinence. GYN:  No discharge, itching, vulvar pain or lesions, pelvic pain, or abnormal vaginal bleeding.  PMFSH:  Past medical history, family history, social history, meds, and allergies were reviewed.  She's allergic to penicillin. Surprisingly she takes no medications. She has pollen allergies and a history of breast cancer.  Physical Exam:   Vital signs:  BP 174/85  Pulse 75  Temp(Src) 97.8 F (36.6 C) (Oral)  Resp 18  SpO2 99% Gen:  Alert, oriented, in no distress. Lungs:  Clear to auscultation, no wheezes, rales or rhonchi. Heart:  Regular rhythm, no gallop or murmer. Abdomen:  Flat and soft. There was slight suprapubic pain to palpation.  No guarding, or rebound.  No hepato-splenomegaly or mass.  Bowel sounds were normally active.  No hernia. Back:  No CVA tenderness.  Skin:  Clear, warm and dry.  Labs:    Results for orders placed during the hospital encounter of 10/08/12  POCT URINALYSIS DIP (DEVICE)      Result Value Range   Glucose, UA NEGATIVE  NEGATIVE mg/dL   Bilirubin Urine NEGATIVE  NEGATIVE   Ketones, ur NEGATIVE  NEGATIVE mg/dL   Specific Gravity, Urine 1.020   1.005 - 1.030   Hgb urine dipstick NEGATIVE  NEGATIVE   pH 7.0  5.0 - 8.0   Protein, ur NEGATIVE  NEGATIVE mg/dL   Urobilinogen, UA 0.2  0.0 - 1.0 mg/dL   Nitrite NEGATIVE  NEGATIVE   Leukocytes, UA NEGATIVE  NEGATIVE     A urine culture was obtained.  Results are pending at this time and we will call about any positive results.  Assessment: The encounter diagnosis was Dysuria.   Her urine was completely clear, but she is having urinary symptoms. Culture to be obtained and will treat for right now. Return to primary care Dr. if no better in one week.  Plan:   1.  Meds:  The following meds were prescribed:   Discharge Medication List as of 10/08/2012  5:41 PM    START taking these medications   Details  ciprofloxacin (CIPRO) 250 MG tablet Take 1 tablet (250 mg total) by mouth every 12 (twelve) hours., Starting 10/08/2012, Until Discontinued, Normal    phenazopyridine (PYRIDIUM) 200 MG tablet Take 1 tablet (200 mg total) by mouth 3 (three) times daily as needed for pain., Starting 10/08/2012, Until Discontinued, Normal        2.  Patient Education/Counseling:  The patient was given appropriate handouts, self care instructions, and instructed in symptomatic relief. The patient was told to avoid intercourse for 10 days, get extra fluids, and return for a follow up with her primary care doctor at the completion of treatment for a repeat  UA and culture.    3.  Follow up:  The patient was told to follow up if no better in 3 to 4 days, if becoming worse in any way, and given some red flag symptoms such as fever or vomiting which would prompt immediate return.  Follow up with her primary care Dr. if no better in one week.     Reuben Likes, MD 10/08/12 2110

## 2012-10-30 ENCOUNTER — Encounter (HOSPITAL_COMMUNITY): Payer: Self-pay | Admitting: Emergency Medicine

## 2012-10-30 ENCOUNTER — Emergency Department (INDEPENDENT_AMBULATORY_CARE_PROVIDER_SITE_OTHER)
Admission: EM | Admit: 2012-10-30 | Discharge: 2012-10-30 | Disposition: A | Discharge status: Home / Self Care | Payer: PRIVATE HEALTH INSURANCE | Source: Home / Self Care

## 2012-10-30 DIAGNOSIS — J069 Acute upper respiratory infection, unspecified: Secondary | ICD-10-CM

## 2012-10-30 MED ORDER — INFLUENZA VAC SPLIT QUAD 0.5 ML IM SUSP: 0.5000 mL | Freq: Once | INTRAMUSCULAR | Status: DC

## 2012-10-30 NOTE — ED Notes (Signed)
C/o post nasal drainage.  Runny nose. Cough productive. Chills.  Denies chest pain and sob.  Denies fever, n/v/d. Onset last night.

## 2012-10-30 NOTE — ED Provider Notes (Signed)
CSN: [id number]     Arrival date & time 10/30/12  1700 History   None    Chief Complaint  Patient presents with  . URI    onset last night.    (Consider location/radiation/quality/duration/timing/severity/associated sxs/prior Treatment) HPI Comments: Pt feels like she has a cold. Last URI was last year.   Patient is a 79 y.o. female presenting with URI. The history is provided by the patient.  URI Presenting symptoms: congestion, cough, rhinorrhea and sore throat   Presenting symptoms: no fever   Severity:  Mild Onset quality:  Gradual Duration:  1 day Timing:  Constant Progression:  Unchanged Chronicity:  New Relieved by:  None tried Exacerbated by: lying flat. Ineffective treatments:  None tried Associated symptoms: no sinus pain     Past Medical History  Diagnosis Date  . Rheumatoid arthritis(714.0)   . Allergic rhinitis   . Adenocarcinoma of breast   . Anxiety   . Depression   . Anemia    Past Surgical History  Procedure Laterality Date  . Masectomy      left breast  . Cataract extraction     Family History  Problem Relation Age of Onset  . Diabetes Mother   . Cancer Father   . Cancer Sister   . Arthritis Son    History  Substance Use Topics  . Smoking status: Former Games developer  . Smokeless tobacco: Never Used  . Alcohol Use: No   OB History   Grav Para Term Preterm Abortions TAB SAB Ect Mult Living                 Review of Systems  Constitutional: Negative for fever.  HENT: Positive for congestion, postnasal drip, rhinorrhea, sinus pressure and sore throat.   Respiratory: Positive for cough. Negative for chest tightness and shortness of breath.   Cardiovascular: Negative for chest pain.    Allergies  Penicillins  Home Medications   Current Outpatient Rx  Name  Route  Sig  Dispense  Refill  . cetirizine (ZYRTEC) 10 MG tablet   Oral   Take 1 tablet (10 mg total) by mouth daily. As needed   30 tablet   11   . ciprofloxacin (CIPRO) 250 MG  tablet   Oral   Take 1 tablet (250 mg total) by mouth every 12 (twelve) hours.   14 tablet   0   . fluticasone (FLONASE) 50 MCG/ACT nasal spray   Nasal   Place 2 sprays into the nose daily.   16 g   2   . guaiFENesin-codeine (ROBITUSSIN AC) 100-10 MG/5ML syrup   Oral   Take 5 mLs by mouth 3 (three) times daily as needed for cough.   120 mL   0   . levofloxacin (LEVAQUIN) 250 MG tablet   Oral   Take 1 tablet (250 mg total) by mouth daily.   7 tablet   0   . neomycin-polymyxin-hydrocortisone (CORTISPORIN) 3.5-10000-1 otic suspension   Both Ears   Place 4 drops into both ears 3 (three) times daily. Used as directed for 5 days   10 mL   0   . phenazopyridine (PYRIDIUM) 200 MG tablet   Oral   Take 1 tablet (200 mg total) by mouth 3 (three) times daily as needed for pain.   15 tablet   0    BP 135/77  Pulse 84  Temp(Src) 98.5 F (36.9 C) (Oral)  Resp 18  SpO2 97% Physical Exam  Constitutional: She appears  well-developed and well-nourished. No distress.  HENT:  Right Ear: External ear normal.  Left Ear: External ear normal.  Nose: Mucosal edema and rhinorrhea present. Right sinus exhibits no maxillary sinus tenderness and no frontal sinus tenderness. Left sinus exhibits no maxillary sinus tenderness and no frontal sinus tenderness.  Mouth/Throat: Oropharynx is clear and moist.  B ears with cerumen impaction  Cardiovascular: Normal rate and regular rhythm.   Pulmonary/Chest: Effort normal and breath sounds normal.  Lymphadenopathy:       Head (right side): No submental, no submandibular and no tonsillar adenopathy present.       Head (left side): No submental, no submandibular and no tonsillar adenopathy present.    She has no cervical adenopathy.    ED Course  Procedures (including critical care time) Labs Review Labs Reviewed - No data to display Imaging Review No results found.  EKG Interpretation     Ventricular Rate:    PR Interval:    QRS  Duration:   QT Interval:    QTC Calculation:   R Axis:     Text Interpretation:              MDM   1. URI (upper respiratory infection)   Pt does appear ill/toxic. Mentally alert and oriented. Pt requests medicine she has received in the past for similar sx. Review of chart indicates tx for rhinorrhea with claritin or zyrtec. Recommended claritin to pt and gave coupon for Delsym cough syrup if needed.  Offered pt flu vaccine but she declined preferring to wait until she is well.      Cathlyn Parsons, NP 10/30/12 2128

## 2012-11-03 NOTE — ED Provider Notes (Signed)
Medical screening examination/treatment/procedure(s) were performed by resident physician or non-physician practitioner and as supervising physician I was immediately available for consultation/collaboration.   Barkley Bruns MD.   Linna Hoff, MD 11/03/12 1228

## 2012-11-09 ENCOUNTER — Ambulatory Visit: Payer: PRIVATE HEALTH INSURANCE | Admitting: Internal Medicine

## 2012-11-14 ENCOUNTER — Ambulatory Visit (INDEPENDENT_AMBULATORY_CARE_PROVIDER_SITE_OTHER): Payer: PRIVATE HEALTH INSURANCE | Admitting: Internal Medicine

## 2012-11-14 ENCOUNTER — Encounter: Payer: Self-pay | Admitting: Internal Medicine

## 2012-11-14 VITALS — BP 116/68 | HR 73 | Temp 98.4°F

## 2012-11-14 DIAGNOSIS — H6120 Impacted cerumen, unspecified ear: Secondary | ICD-10-CM

## 2012-11-14 DIAGNOSIS — H6123 Impacted cerumen, bilateral: Secondary | ICD-10-CM

## 2012-11-14 DIAGNOSIS — J3089 Other allergic rhinitis: Secondary | ICD-10-CM

## 2012-11-14 MED ORDER — FLUTICASONE PROPIONATE 50 MCG/ACT NA SUSP: 2.0000 | Freq: Every day | NASAL | Status: DC

## 2012-11-14 MED ORDER — PHENAZOPYRIDINE HCL 200 MG PO TABS: 200.0000 mg | ORAL_TABLET | Freq: Three times a day (TID) | ORAL | Status: DC | PRN

## 2012-11-14 NOTE — Progress Notes (Signed)
Pre visit review using our clinic review tool, if applicable. No additional management support is needed unless otherwise documented below in the visit note. 

## 2012-11-14 NOTE — Patient Instructions (Signed)
Cold and sinus - no sign of infection. Plan Rx for flonase nasal spray. Use 1 spray to each nostril once a day   Burning with urination. Urinalysis was normal at Urgent Care Plan Rx sent in for pyridium to take 3 times a day as needed for burning with urination  Ear wax - cleared today.  Come back in a month or two for more complete exam.

## 2012-11-14 NOTE — Progress Notes (Signed)
  Subjective:    Patient ID: Angela [name], female    DOB: 06/15/1918, 79 y.o.   MRN: [id number]  HPI Ms. Kolek was seen at Urgent Care 10/24/12 - note reviewed - for a mild URI. She had no evidence of infection requiring antibiotics and was treated with Delsym and claritin. She was unable to afford the claritin and she did get the delsym.   She reports that she is better but still has a lot of congestion and pressure facial sinuses with copious mucus production.  She did have cerumen impactions noted at [facility] but she was not treated. She has not felt like she had any fever, denies shortness of breath.   She continues to have a problem with dry skin along with pruritis.  She c/o occasional leg cramps that are intermittent. She also c/o feeling weak and run down   Review of Systems System review is negative for any constitutional, cardiac, pulmonary, GI or neuro symptoms or complaints other than as described in the HPI.     Objective:   Physical Exam Filed Vitals:   11/14/12 1601  BP: 116/68  Pulse: 73  Temp: 98.4 F (36.9 C)   Wt Readings from Last 3 Encounters:  12/22/09 120 lb (54.432 kg)  08/05/09 110 lb (49.896 kg)  07/24/09 110 lb (49.896 kg)   Gen'l- Very elderly woman in no distress HEENT - no facial tenderness to percussion. EACs with cerumen impaction Lung - CTAP Cor - RRR II/VI systolic mm best LSB Neuro - awake and alert.        Assessment & Plan:  Cerumen impaction bilateral - successfully irrigated w/o complications

## 2012-11-15 NOTE — Assessment & Plan Note (Signed)
Was seen at Urgent Care - no infection, all records reviewed. Continues to have drainage but no signs of infection.  Plan Flonase spray to each nostril daily

## 2012-11-22 ENCOUNTER — Telehealth: Payer: Self-pay | Admitting: Internal Medicine

## 2012-11-22 NOTE — Telephone Encounter (Signed)
Patient called requesting different medicine, she stated insurance will not cover the phenazopyridine (PYRIDIUM) 200 MG  Would like something called in that her insurance will cover

## 2012-11-22 NOTE — Telephone Encounter (Signed)
The alternative is over the counter Azopyridine

## 2012-12-07 ENCOUNTER — Ambulatory Visit (INDEPENDENT_AMBULATORY_CARE_PROVIDER_SITE_OTHER): Payer: PRIVATE HEALTH INSURANCE

## 2012-12-07 DIAGNOSIS — Z23 Encounter for immunization: Secondary | ICD-10-CM

## 2012-12-21 ENCOUNTER — Encounter (HOSPITAL_COMMUNITY): Payer: Self-pay | Admitting: Emergency Medicine

## 2012-12-21 ENCOUNTER — Emergency Department (INDEPENDENT_AMBULATORY_CARE_PROVIDER_SITE_OTHER)
Admission: EM | Admit: 2012-12-21 | Discharge: 2012-12-21 | Disposition: A | Discharge status: Home / Self Care | Payer: PRIVATE HEALTH INSURANCE | Source: Home / Self Care | Attending: Family Medicine | Admitting: Family Medicine

## 2012-12-21 ENCOUNTER — Emergency Department (HOSPITAL_COMMUNITY): Payer: PRIVATE HEALTH INSURANCE

## 2012-12-21 DIAGNOSIS — M47812 Spondylosis without myelopathy or radiculopathy, cervical region: Secondary | ICD-10-CM

## 2012-12-21 MED ORDER — MELOXICAM 7.5 MG PO TABS: 3.7500 mg | ORAL_TABLET | Freq: Every day | ORAL | Status: DC

## 2012-12-21 NOTE — ED Provider Notes (Signed)
CSN: [id number]     Arrival date & time 12/21/12  1515 History   None    Chief Complaint  Patient presents with  . Neck Pain   (Consider location/radiation/quality/duration/timing/severity/associated sxs/prior Treatment) HPI Comments: Angela [name] come to the UC for evaluation of acute exacerbation of intermittent neck discomfort. She reports that she has arthritis in her neck and over the last several nights, she has had discomfort in her neck when trying to turn over in bed at night. Denies injury or illness. States neck is "stiff and sore" but that she did have some relief last night when she applied a topical "muscle rub" to affected area. States the right side is slightly more uncomfortable than the left. Denies strength or sensory changes in either upper extremity. Denies CP, dyspnea or radiation of her pain. Lives at home with her son and is wheelchair bound (lower extremity contractures from OA).   The history is provided by the patient.    Past Medical History  Diagnosis Date  . Rheumatoid arthritis(714.0)   . Allergic rhinitis   . Adenocarcinoma of breast   . Anxiety   . Depression   . Anemia    Past Surgical History  Procedure Laterality Date  . Masectomy      left breast  . Cataract extraction     Family History  Problem Relation Age of Onset  . Diabetes Mother   . Cancer Father   . Cancer Sister   . Arthritis Son    History  Substance Use Topics  . Smoking status: Former Games developer  . Smokeless tobacco: Never Used  . Alcohol Use: No   OB History   Grav Para Term Preterm Abortions TAB SAB Ect Mult Living                 Review of Systems  Constitutional: Negative for fever and fatigue.  HENT: Negative.   Respiratory: Negative.   Cardiovascular: Negative.   Gastrointestinal: Negative.   Musculoskeletal: Positive for arthralgias, neck pain and neck stiffness.  Skin: Negative.   Neurological: Negative for weakness, numbness and headaches.  Hematological:  Negative for adenopathy.  Psychiatric/Behavioral: Negative.     Allergies  Penicillins  Home Medications   Current Outpatient Rx  Name  Route  Sig  Dispense  Refill  . cetirizine (ZYRTEC) 10 MG tablet   Oral   Take 1 tablet (10 mg total) by mouth daily. As needed   30 tablet   11   . fluticasone (FLONASE) 50 MCG/ACT nasal spray   Each Nare   Place 2 sprays into both nostrils daily.   16 g   5   . guaiFENesin-codeine (ROBITUSSIN AC) 100-10 MG/5ML syrup   Oral   Take 5 mLs by mouth 3 (three) times daily as needed for cough.   120 mL   0   . meloxicam (MOBIC) 7.5 MG tablet   Oral   Take 0.5 tablets (3.75 mg total) by mouth daily. As needed for pain   30 tablet   0   . phenazopyridine (PYRIDIUM) 200 MG tablet   Oral   Take 1 tablet (200 mg total) by mouth 3 (three) times daily as needed for pain.   15 tablet   0    BP 127/71  Pulse 66  Temp(Src) 97.8 F (36.6 C) (Oral)  Resp 16  SpO2 100% Physical Exam  Nursing note and vitals reviewed. Constitutional: She is oriented to person, place, and time. She appears well-developed and well-nourished.  No distress.  HENT:  Head: Normocephalic and atraumatic.  Eyes: Conjunctivae are normal.  Neck: Phonation normal. Neck supple. No JVD present. Muscular tenderness present. No tracheal tenderness and no spinous process tenderness present. Carotid bruit is not present. Decreased range of motion present. No tracheal deviation present. No mass and no thyromegaly present.  Cardiovascular: Normal rate, regular rhythm and normal heart sounds.   Pulmonary/Chest: Effort normal and breath sounds normal. No stridor.  Abdominal: Soft. Bowel sounds are normal. She exhibits no distension. There is no tenderness.  Lymphadenopathy:    She has no cervical adenopathy.  Neurological: She is alert and oriented to person, place, and time.  Strength and sensation of both upper extremities intact.   Skin: Skin is warm and dry. No rash noted.   Psychiatric: She has a normal mood and affect. Her behavior is normal. Judgment and thought content normal.    ED Course  Procedures (including critical care time) Labs Review Labs Reviewed - No data to display Imaging Review No results found.  EKG Interpretation    Date/Time:    Ventricular Rate:    PR Interval:    QRS Duration:   QT Interval:    QTC Calculation:   R Axis:     Text Interpretation:              MDM   ECG: NSR @ 74bpm and no acute ST/ T wave changes. Attempted to obtain C-spine films, however, patient unable to stand for films to be completed. Recommended to son and patient that she begin small dose of Mobic to try to relieve her discomfort and to follow up with her PCP if this treatment is unsuccessful.   Jess Barters [place], [place] 12/21/12 2211

## 2012-12-21 NOTE — ED Notes (Signed)
Pt c/o neck pain and HA onset 2 weeks  Pain increases at night when she tries to sleep or when she tries to move... Hx of arthritis Denies: f/v/n/d She is alert w/no signs of acute distress.

## 2012-12-22 NOTE — ED Provider Notes (Signed)
Medical screening examination/treatment/procedure(s) were performed by resident physician or non-physician practitioner and as supervising physician I was immediately available for consultation/collaboration.   Barkley Bruns MD.   Linna Hoff, MD 12/22/12 1710

## 2013-02-20 ENCOUNTER — Ambulatory Visit: Payer: PRIVATE HEALTH INSURANCE | Admitting: Internal Medicine

## 2013-03-09 ENCOUNTER — Ambulatory Visit (INDEPENDENT_AMBULATORY_CARE_PROVIDER_SITE_OTHER): Payer: PRIVATE HEALTH INSURANCE | Admitting: Nurse Practitioner

## 2013-03-09 ENCOUNTER — Encounter: Payer: Self-pay | Admitting: Nurse Practitioner

## 2013-03-09 VITALS — BP 120/64 | HR 70 | Temp 97.7°F | Ht 63.0 in

## 2013-03-09 DIAGNOSIS — R300 Dysuria: Secondary | ICD-10-CM

## 2013-03-09 LAB — POCT URINALYSIS DIPSTICK
Bilirubin, UA: NEGATIVE
GLUCOSE UA: NEGATIVE
Ketones, UA: NEGATIVE
Leukocytes, UA: NEGATIVE
Nitrite, UA: NEGATIVE
Protein, UA: NEGATIVE
RBC UA: NEGATIVE
Spec Grav, UA: 1.02
UROBILINOGEN UA: 0.2
pH, UA: 5.5

## 2013-03-09 MED ORDER — CIPROFLOXACIN HCL 250 MG PO TABS: 250.0000 mg | ORAL_TABLET | Freq: Two times a day (BID) | ORAL | Status: DC

## 2013-03-09 NOTE — Progress Notes (Signed)
Pre visit review using our clinic review tool, if applicable. No additional management support is needed unless otherwise documented below in the visit note. 

## 2013-03-09 NOTE — Patient Instructions (Signed)
I will start antibiotic although I am not certain you have infection. Please follow up with one of dr Linda Hedges partners in 2 weeks or sooner if you are not feeling better.  Start sinus rinses (Neilmed sinus rinse) daily for nasal drainage.  Use moist heat on neck & shoulder when you have shoulder pain (sock with dry rice in microwave). You may rub aspercream on shoulder also.

## 2013-03-09 NOTE — Progress Notes (Signed)
   Subjective:    Patient ID: Angela [name], female    DOB: 05-Sep-1918, 80 y.o.   MRN: [id number]  Urinary Tract Infection  This is a new problem. The current episode started 1 to 4 weeks ago (1wk). The problem occurs every urination. The problem has been unchanged. The quality of the pain is described as burning. There has been no fever. She is not sexually active. Associated symptoms include flank pain, frequency and urgency. Pertinent negatives include no chills, discharge, nausea, sweats or vomiting. Associated symptoms comments: Also c/o HA today & pain in R shoulder. She has tried nothing for the symptoms. Her past medical history is significant for recurrent UTIs. wheelchair bound, needs help with transfers, secondary to RA      Review of Systems  Constitutional: Negative for fever, chills, activity change and fatigue.  HENT: Positive for congestion (nasal). Negative for ear pain and sore throat.   Respiratory: Negative for cough, chest tightness, shortness of breath and wheezing.   Gastrointestinal: Positive for abdominal pain (low abd feels "tight"). Negative for nausea, vomiting and diarrhea.  Genitourinary: Positive for dysuria, urgency, frequency and flank pain. Negative for vaginal discharge and vaginal pain.  Musculoskeletal: Positive for arthralgias (c/o pain R shoulder) and back pain.       Bilat flank pain   Neurological: Positive for headaches.       Objective:   Physical Exam  Vitals reviewed. Constitutional: She is oriented to person, place, and time. She appears well-developed and well-nourished. No distress.  HENT:  Head: Normocephalic and atraumatic.  Right Ear: External ear normal.  Left Ear: External ear normal.  Mouth/Throat: Oropharynx is clear and moist. No oropharyngeal exudate.  Unable to visualize TM due to ceruminosis  Eyes: Conjunctivae are normal. Right eye exhibits no discharge. Left eye exhibits no discharge.  Neck: Normal range of motion. Neck  supple. No thyromegaly present.  Cardiovascular: Normal rate, regular rhythm and normal heart sounds.   No murmur heard. Pulmonary/Chest: Effort normal and breath sounds normal. No respiratory distress. She has no wheezes. She has no rales.  Abdominal: Soft. She exhibits no distension and no mass. There is no tenderness. There is no rebound and no guarding.  Musculoskeletal: She exhibits no tenderness (no CVA tenderness).  Lymphadenopathy:    She has no cervical adenopathy.  Neurological: She is alert and oriented to person, place, and time.  Skin: Skin is warm and dry.  Psychiatric: She has a normal mood and affect. Her behavior is normal. Thought content normal.          Assessment & Plan:  1. Dysuria Frequency, abdominal discomfort - Urine culture-pending - POCT urinalysis dipstick-NML - ciprofloxacin (CIPRO) 250 MG tablet; Take 1 tablet (250 mg total) by mouth 2 (two) times daily.  Dispense: 6 tablet; Refill: 0 Drink at least 40 oz water daily.

## 2013-03-10 LAB — URINE CULTURE

## 2013-03-23 ENCOUNTER — Telehealth: Payer: Self-pay | Admitting: *Deleted

## 2013-03-23 ENCOUNTER — Ambulatory Visit: Payer: PRIVATE HEALTH INSURANCE | Admitting: Nurse Practitioner

## 2013-03-23 NOTE — Telephone Encounter (Signed)
Returned pt's call. Patient stated that she is going to call [place] office and make appt to see one of the doctors there because it is closer to her home.

## 2013-03-23 NOTE — Telephone Encounter (Signed)
Patient left vm canceling her appointment for 03/23/2013 at 11:00 am. Patient would like a call back.

## 2013-07-10 ENCOUNTER — Encounter (HOSPITAL_COMMUNITY): Payer: Self-pay | Admitting: Emergency Medicine

## 2013-07-10 ENCOUNTER — Emergency Department (INDEPENDENT_AMBULATORY_CARE_PROVIDER_SITE_OTHER)
Admission: EM | Admit: 2013-07-10 | Discharge: 2013-07-10 | Disposition: A | Discharge status: Home / Self Care | Payer: PRIVATE HEALTH INSURANCE | Source: Home / Self Care

## 2013-07-10 DIAGNOSIS — L299 Pruritus, unspecified: Secondary | ICD-10-CM

## 2013-07-10 DIAGNOSIS — N390 Urinary tract infection, site not specified: Secondary | ICD-10-CM

## 2013-07-10 LAB — POCT URINALYSIS DIP (DEVICE)
Bilirubin Urine: NEGATIVE
Glucose, UA: NEGATIVE mg/dL
Ketones, ur: NEGATIVE mg/dL
NITRITE: POSITIVE — AB
PH: 6.5 (ref 5.0–8.0)
Protein, ur: NEGATIVE mg/dL
Specific Gravity, Urine: 1.015 (ref 1.005–1.030)
Urobilinogen, UA: 0.2 mg/dL (ref 0.0–1.0)

## 2013-07-10 MED ORDER — CETIRIZINE HCL 1 MG/ML PO SYRP: ORAL_SOLUTION | ORAL | Status: DC

## 2013-07-10 MED ORDER — CIPROFLOXACIN HCL 250 MG PO TABS: 250.0000 mg | ORAL_TABLET | Freq: Two times a day (BID) | ORAL | Status: DC

## 2013-07-10 NOTE — Discharge Instructions (Signed)
Pruritus  Pruritis is an itch. There are many different problems that can cause an itch. Dry skin is one of the most common causes of itching. Most cases of itching do not require medical attention.  HOME CARE INSTRUCTIONS  Make sure your skin is moistened on a regular basis. A moisturizer that contains petroleum jelly is best for keeping moisture in your skin. If you develop a rash, you may try the following for relief:   Use corticosteroid cream.  Apply cool compresses to the affected areas.  Bathe with Epsom salts or baking soda in the bathwater.  Soak in colloidal oatmeal baths. These are available at your pharmacy.  Apply baking soda paste to the rash. Stir water into baking soda until it reaches a paste-like consistency.  Use an anti-itch lotion.  Take over-the-counter diphenhydramine medicine by mouth as the instructions direct.  Avoid scratching. Scratching may cause the rash to become infected. If itching is very bad, your caregiver may suggest prescription lotions or creams to lessen your symptoms.  Avoid hot showers, which can make itching worse. A cold shower may help with itching as long as you use a moisturizer after the shower. SEEK MEDICAL CARE IF: The itching does not go away after several days. Document Released: 09/02/2010 Document Revised: 03/15/2011 Document Reviewed: 09/02/2010 [facility] Patient Information 2015 [place], [place]. This information is not intended to replace advice given to you by your health care provider. Make sure you discuss any questions you have with your health care provider.  Urinary Tract Infection Urinary tract infections (UTIs) can develop anywhere along your urinary tract. Your urinary tract is your body's drainage system for removing wastes and extra water. Your urinary tract includes two kidneys, two ureters, a bladder, and a urethra. Your kidneys are a pair of bean-shaped organs. Each kidney is about the size of your fist. They are located  below your ribs, one on each side of your spine. CAUSES Infections are caused by microbes, which are microscopic organisms, including fungi, viruses, and bacteria. These organisms are so small that they can only be seen through a microscope. Bacteria are the microbes that most commonly cause UTIs. SYMPTOMS  Symptoms of UTIs may vary by age and gender of the patient and by the location of the infection. Symptoms in young women typically include a frequent and intense urge to urinate and a painful, burning feeling in the bladder or urethra during urination. Older women and men are more likely to be tired, shaky, and weak and have muscle aches and abdominal pain. A fever may mean the infection is in your kidneys. Other symptoms of a kidney infection include pain in your back or sides below the ribs, nausea, and vomiting. DIAGNOSIS To diagnose a UTI, your caregiver will ask you about your symptoms. Your caregiver also will ask to provide a urine sample. The urine sample will be tested for bacteria and white blood cells. White blood cells are made by your body to help fight infection. TREATMENT  Typically, UTIs can be treated with medication. Because most UTIs are caused by a bacterial infection, they usually can be treated with the use of antibiotics. The choice of antibiotic and length of treatment depend on your symptoms and the type of bacteria causing your infection. HOME CARE INSTRUCTIONS  If you were prescribed antibiotics, take them exactly as your caregiver instructs you. Finish the medication even if you feel better after you have only taken some of the medication.  Drink enough water and fluids  to keep your urine clear or pale yellow.  Avoid caffeine, tea, and carbonated beverages. They tend to irritate your bladder.  Empty your bladder often. Avoid holding urine for long periods of time.  Empty your bladder before and after sexual intercourse.  After a bowel movement, women should cleanse  from front to back. Use each tissue only once. SEEK MEDICAL CARE IF:   You have back pain.  You develop a fever.  Your symptoms do not begin to resolve within 3 days. SEEK IMMEDIATE MEDICAL CARE IF:   You have severe back pain or lower abdominal pain.  You develop chills.  You have nausea or vomiting.  You have continued burning or discomfort with urination. MAKE SURE YOU:   Understand these instructions.  Will watch your condition.  Will get help right away if you are not doing well or get worse. Document Released: 09/30/2004 Document Revised: 06/22/2011 Document Reviewed: 01/29/2011 [facility] Patient Information 2015 [place], [place]. This information is not intended to replace advice given to you by your health care provider. Make sure you discuss any questions you have with your health care provider.

## 2013-07-10 NOTE — ED Provider Notes (Signed)
CSN: [id number]     Arrival date & time 07/10/13  1416 History   First MD Initiated Contact with Patient 07/10/13 1451     Chief Complaint  Patient presents with  . Fatigue   (Consider location/radiation/quality/duration/timing/severity/associated sxs/prior Treatment) HPI Comments: Poor historian. 80 y o f with urinary frequency and urgency for over a month. Having to get up several times during the night to urinate. Equivocal dysuria. Has seen PCP for this and told might be UTI and tx with ABX that did not help.   C/O itchy generalized itchy , burning  rash, esp arms and legs. Comes a goes. Onset within "last few days".   Past Medical History  Diagnosis Date  . Rheumatoid arthritis(714.0)   . Allergic rhinitis   . Adenocarcinoma of breast   . Anxiety   . Depression   . Anemia    Past Surgical History  Procedure Laterality Date  . Masectomy      left breast  . Cataract extraction     Family History  Problem Relation Age of Onset  . Diabetes Mother   . Cancer Father   . Cancer Sister   . Arthritis Son    History  Substance Use Topics  . Smoking status: Former Research scientist (life sciences)  . Smokeless tobacco: Never Used  . Alcohol Use: No   OB History   Grav Para Term Preterm Abortions TAB SAB Ect Mult Living                 Review of Systems  Constitutional: Positive for fatigue. Negative for fever, activity change and appetite change.  HENT: Negative.   Respiratory: Negative for cough and shortness of breath.   Cardiovascular: Negative for chest pain.  Gastrointestinal: Negative.   Genitourinary: Negative for pelvic pain.  Musculoskeletal: Negative.   Skin: Positive for rash.  Neurological: Negative for syncope and headaches.    Allergies  Penicillins  Home Medications   Prior to Admission medications   Medication Sig Start Date End Date Taking? Authorizing Provider  cetirizine (ZYRTEC) 1 MG/ML syrup 5 ml po q hs prn itching 07/10/13   Janne Napoleon, NP  ciprofloxacin (CIPRO)  250 MG tablet Take 1 tablet (250 mg total) by mouth 2 (two) times daily. 03/09/13   Irene Pap, NP  ciprofloxacin (CIPRO) 250 MG tablet Take 1 tablet (250 mg total) by mouth every 12 (twelve) hours. 07/10/13   Janne Napoleon, NP  fluticasone (FLONASE) 50 MCG/ACT nasal spray Place 2 sprays into both nostrils daily. 11/14/12   Neena Rhymes, MD  guaiFENesin-codeine [facility]) 100-10 MG/5ML syrup Take 5 mLs by mouth 3 (three) times daily as needed for cough. 11/12/11   Adlih Moreno-Coll, MD  meloxicam (MOBIC) 7.5 MG tablet Take 0.5 tablets (3.75 mg total) by mouth daily. As needed for pain 12/21/12   Lahoma Rocker, PA  phenazopyridine (PYRIDIUM) 200 MG tablet Take 1 tablet (200 mg total) by mouth 3 (three) times daily as needed for pain. 11/14/12   Neena Rhymes, MD   BP 155/78  Pulse 72  Temp(Src) 98.1 F (36.7 C) (Oral)  Resp 20  SpO2 99% Physical Exam  Nursing note and vitals reviewed. Constitutional: She appears well-developed and well-nourished.  Neck: Normal range of motion. Neck supple.  Cardiovascular: Normal rate.   Murmur heard. Premature beats. Pansystolic murmur.  Pulmonary/Chest: Effort normal and breath sounds normal.  Abdominal: Soft. There is no tenderness.  Musculoskeletal: She exhibits no edema.  Neurological: She is alert. She  exhibits normal muscle tone.  Skin: Skin is warm and dry.  Am unable see or palpate any sort of rash or nonspecific skin eruption.    ED Course  Procedures (including critical care time) Labs Review Labs Reviewed  POCT URINALYSIS DIP (DEVICE) - Abnormal; Notable for the following:    Hgb urine dipstick TRACE (*)    Nitrite POSITIVE (*)    Leukocytes, UA SMALL (*)    All other components within normal limits  URINE CULTURE    Imaging Review No results found.   MDM   1. UTI (lower urinary tract infection)   2. Itching    zyrtec 5 mg po q hs prn itching cipro 250 mg bid for 7 d Plenty of fluids U/C  pending       Janne Napoleon, NP 07/10/13 1559

## 2013-07-10 NOTE — ED Notes (Signed)
C/o has to urinate a lot during night, tired all the time, itching all over . Concerned about her GP retiring soon

## 2013-07-11 LAB — URINE CULTURE: Colony Count: 70000

## 2013-07-11 NOTE — ED Notes (Addendum)
Urine culture: 70,000 colonies Lactobacillus.  Treated with Cipro.  Message to Janne Napoleon NP. Roselyn Meier 07/11/2013 7/10  Shanon Brow writes it is contaminant probably from the vaginal flora.  No further action needed. 07/16/2013

## 2013-07-11 NOTE — ED Provider Notes (Signed)
Medical screening examination/treatment/procedure(s) were performed by resident physician or non-physician practitioner and as supervising physician I was immediately available for consultation/collaboration.   Pauline Good MD.   Billy Fischer, MD 07/11/13 2056

## 2013-07-19 ENCOUNTER — Encounter: Payer: Self-pay | Admitting: Internal Medicine

## 2013-07-19 ENCOUNTER — Other Ambulatory Visit: Payer: PRIVATE HEALTH INSURANCE

## 2013-07-19 ENCOUNTER — Ambulatory Visit (INDEPENDENT_AMBULATORY_CARE_PROVIDER_SITE_OTHER): Payer: PRIVATE HEALTH INSURANCE | Admitting: Internal Medicine

## 2013-07-19 VITALS — BP 130/60 | HR 65 | Temp 98.1°F

## 2013-07-19 DIAGNOSIS — N952 Postmenopausal atrophic vaginitis: Secondary | ICD-10-CM

## 2013-07-19 DIAGNOSIS — R300 Dysuria: Secondary | ICD-10-CM

## 2013-07-19 DIAGNOSIS — L853 Xerosis cutis: Secondary | ICD-10-CM

## 2013-07-19 DIAGNOSIS — L258 Unspecified contact dermatitis due to other agents: Secondary | ICD-10-CM

## 2013-07-19 NOTE — Progress Notes (Signed)
Pre visit review using our clinic review tool, if applicable. No additional management support is needed unless otherwise documented below in the visit note. 

## 2013-07-19 NOTE — Progress Notes (Signed)
   Subjective:    Patient ID: Angela [name], female    DOB: 04/04/1918, 80 y.o.   MRN: [id number]  HPI Pt seen in urgent care 07/10/13 for UTI symptoms and generalized itching. Her urine showed 70k lactobacilus, likely vaginal contaminate. She was given cipro 500mg  x 7 days though she has not started taking it. She was additionally given cetirizine for the itching which she has not starting taking.   Today pt continues to have GU symptoms. Pt is a poor historian. She reports general hypersensitivity in her vaginal area. She does have a history of atrophic vaginitis. She reports vague dysuria and possibly increased frequency. She denies hematuria or pyuria. She denies flank pain, fever or chills.  Pt's itching is constant. She describes a rash on her arms and legs. She has not tried any remedies.   Review of Systems  Constitutional: Negative for fever and chills.  Respiratory: Negative for cough and shortness of breath.   Gastrointestinal: Negative for abdominal pain.  Genitourinary: Positive for vaginal pain. Negative for hematuria and flank pain.      Objective:   Physical Exam No rash  Large skin polyp on R thigh      Assessment & Plan:

## 2013-07-19 NOTE — Patient Instructions (Signed)
Use Eucerin or Aveeno Daily  Moisturizing Lotion  twice a day  for the dry skin. Bathe with moisturizing liquid soap , not bar soap.    I recommend a Gynecology  consultation to treat the drying inside the female organs. Vaginal drying can cause recurrent urinary tract infections.Let us know if we can schedule that .

## 2013-07-19 NOTE — Progress Notes (Signed)
   Subjective:    Patient ID: Angela [name], female    DOB: [birth year], 80 y.o.   MRN: [id number]  HPI  She was seen 07/10/13 at the urgent care clinic with dysuria and frequency. She had had frequency and urgency for at least a month and also describes nocturia several times a night  Urine revealed nitrites and culture grew 70,000 colonies of lactobacillus.  She was given a prescription for Cipro which she filled but did not take.   Review of Systems  She also complained of diffuse itching on the trunk and extremities, especially on the arms and legs.  She is concerned that she might have a tick on the thigh as she noted a lesion this morning while bathing.       Objective:   Physical Exam  She is very frail elderly female who appears her stated age.  She is thin but appears adequately nourished  She has no scleral icterus. There is resting esotropia of the left eye.  Dental hygiene is extremely poor. The maxilla is edentulous. There few remaining maxillary teeth most of which are carious  She has no lymphadenopathy about the neck or axilla  Grade 5-7.8 systolic murmur.  Chest is clear with no increased work of breathing  Bowel sounds are active; she has no organomegaly or masses.  There is no tenderness to percussion of the flanks  There is a large right lipoma over the right posterior thorax.  She is a large polypoid lesion in the right upper thigh. There is no evidence of tic or other factor.  No rash visualized but skin is dry with slight tenting She has 1/2+ edema  Pedal pulses are decreased        Assessment & Plan:  #1 dysuria, frequency, and urgency with 70,000 colonies of lactobacillus.  #2 history of vaginal atrophy which she has declined to have treated  Plan see orders and after visit summary.

## 2013-07-20 LAB — URINE CULTURE
Colony Count: NO GROWTH
ORGANISM ID, BACTERIA: NO GROWTH

## 2013-07-25 ENCOUNTER — Telehealth: Payer: Self-pay | Admitting: Internal Medicine

## 2013-07-25 ENCOUNTER — Telehealth: Payer: Self-pay | Admitting: *Deleted

## 2013-07-25 ENCOUNTER — Other Ambulatory Visit: Payer: Self-pay | Admitting: Internal Medicine

## 2013-07-25 DIAGNOSIS — R300 Dysuria: Secondary | ICD-10-CM

## 2013-07-25 NOTE — Telephone Encounter (Signed)
Pt states md told her to see gynecologist. Pt is needing a referral..../lmb

## 2013-07-25 NOTE — Telephone Encounter (Signed)
Phone call to patient. Very confusing. She was advised at her office visit with you not to take the cipro and cetirizine she was prescribed at urgent care. She is still having dysuria. Please advise.

## 2013-07-25 NOTE — Telephone Encounter (Signed)
Phone call to patient. She has been advised to schedule with a gynecologist per MD message below. She did not present with any questions or concerns.

## 2013-07-25 NOTE — Telephone Encounter (Signed)
Pt requesting phone call from the assistant concern about direction of medication that Dr. Linna Darner advise her to take for uti. Please call pt. She saw Dr. Linna Darner 07/19/13.

## 2013-07-25 NOTE — Telephone Encounter (Signed)
No bacteria in urine. She should see a Gyn for vaginal drying which can cause this

## 2013-07-26 NOTE — Telephone Encounter (Signed)
Pt was notified will be contacted once appt has been set-up...Angela [name]

## 2013-07-26 NOTE — Telephone Encounter (Signed)
Order entered 7/22

## 2013-08-09 ENCOUNTER — Ambulatory Visit (INDEPENDENT_AMBULATORY_CARE_PROVIDER_SITE_OTHER): Payer: Medicare Other | Admitting: Gynecology

## 2013-08-09 ENCOUNTER — Ambulatory Visit: Payer: Medicare Other | Admitting: Gynecology

## 2013-08-09 ENCOUNTER — Encounter: Payer: Self-pay | Admitting: Gynecology

## 2013-08-09 DIAGNOSIS — N952 Postmenopausal atrophic vaginitis: Secondary | ICD-10-CM

## 2013-08-09 DIAGNOSIS — R350 Frequency of micturition: Secondary | ICD-10-CM

## 2013-08-09 DIAGNOSIS — IMO0001 Reserved for inherently not codable concepts without codable children: Secondary | ICD-10-CM

## 2013-08-09 LAB — URINALYSIS W MICROSCOPIC + REFLEX CULTURE
Bilirubin Urine: NEGATIVE
Glucose, UA: NEGATIVE mg/dL
HGB URINE DIPSTICK: NEGATIVE
KETONES UR: NEGATIVE mg/dL
Leukocytes, UA: NEGATIVE
Nitrite: NEGATIVE
PH: 7 (ref 5.0–8.0)
Protein, ur: NEGATIVE mg/dL
Specific Gravity, Urine: 1.015 (ref 1.005–1.030)
Urobilinogen, UA: 0.2 mg/dL (ref 0.0–1.0)

## 2013-08-09 MED ORDER — NONFORMULARY OR COMPOUNDED ITEM: Status: DC

## 2013-08-09 NOTE — Addendum Note (Signed)
Addended by: Thurnell Garbe A on: 08/09/2013 04:11 PM   Modules accepted: Orders

## 2013-08-09 NOTE — Progress Notes (Signed)
   80 year old who was referred for practice as a courtesy of Dr. Linna Darner as a result of the patient's vaginal dryness, irritation and urinary frequency. The patient had been treated recently for generalized pruritus as well as for UTI in 07/10/2013. She had been prescribed Cipro 500 mg for 7 days but there was a questionable issue with compliance. We did do a urinalysis today in the office today which was completely normal. Her son was present and some of the questioning. Her main concern is vaginal dryness and irritation. She denies any vaginal bleeding. She denies any hematochezia. No back pain, fever, chills, nausea, or vomiting. She describes a lot of vaginal sensitivity and tenderness. Patient denies any urinary incontinence.  Uncertain if she used HRT in the past. No GYN surgery reported. She has had left breast mastectomy over 20 years ago for breast cancer.  Exam: Abdomen: Soft nontender no rebound or guarding Bartholin urethra and Skene glands severe vaginal atrophy and irritated on contact Vagina: Severe vaginal atrophy Cervix: No lesions or discharge Bimanual exam: Small anteverted uterus nontender Adnexa: No palpable mass or tenderness Rectal exam not done  Assessment/plan: Patient's irritation is attributed to her atrophic vaginitis. Patient over 20 years from her breast cancer with no evidence of recurrent disease. Low-dose vaginal estrogen applied topically and small amount intravaginally twice a week will help with her symptomatology is acceptable treatment at this time for this patient. All the above were discussed with her son and instructions provided in written format for her female family member to help with application.

## 2013-08-09 NOTE — Patient Instructions (Signed)
Atrophic Vaginitis Atrophic vaginitis is a problem of low levels of estrogen in women. This problem can happen at any age. It is most common in women who have gone through menopause ("the change").  HOW WILL I KNOW IF I HAVE THIS PROBLEM? You may have:  Trouble with peeing (urinating), such as:  Going to the bathroom often.  A hard time holding your pee until you reach a bathroom.  Leaking pee.  Having pain when you pee.  Itching or a burning feeling.  Vaginal bleeding and spotting.  Pain during sex.  Dryness of the vagina.  A yellow, bad-smelling fluid (discharge) coming from the vagina. HOW WILL MY DOCTOR CHECK FOR THIS PROBLEM?  During your exam, your doctor will likely find the problem.  If there is a vaginal fluid, it may be checked for infection. HOW WILL THIS PROBLEM BE TREATED? Keep the vulvar skin as clean as possible. Moisturizers and lubricants can help with some of the symptoms. Estrogen replacement can help. There are 2 ways to take estrogen:  Systemic estrogen gets estrogen to your whole body. It takes many weeks or months before the symptoms get better.  You take an estrogen pill.  You use a skin patch. This is a patch that you put on your skin.  If you still have your uterus, your doctor may ask you to take a hormone. Talk to your doctor about the right medicine for you.  Estrogen cream.  This puts estrogen only at the part of your body where you apply it. The cream is put into the vagina or put on the vulvar skin. For some women, estrogen cream works faster than pills or the patch. CAN ALL WOMEN WITH THIS PROBLEM USE ESTROGEN? No. Women with certain types of cancer, liver problems, or problems with blood clots should not take estrogen. Your doctor can help you decide the best treatment for your symptoms. Document Released: 06/09/2007 Document Revised: 12/26/2012 Document Reviewed: 06/09/2007 [facility] Patient Information 2015 [place], [place]. This  information is not intended to replace advice given to you by your health care provider. Make sure you discuss any questions you have with your health care provider. Hormone Therapy At menopause, your body begins making less estrogen and progesterone hormones. This causes the body to stop having menstrual periods. This is because estrogen and progesterone hormones control your periods and menstrual cycle. A lack of estrogen may cause symptoms such as:  Hot flushes (or hot flashes).  Vaginal dryness.  Dry skin.  Loss of sex drive.  Risk of bone loss (osteoporosis). When this happens, you may choose to take hormone therapy to get back the estrogen lost during menopause. When the hormone estrogen is given alone, it is usually referred to as ET (Estrogen Therapy). When the hormone progestin is combined with estrogen, it is generally called HT (Hormone Therapy). This was formerly known as hormone replacement therapy (HRT). Your caregiver can help you make a decision on what will be best for you. The decision to use HT seems to change often as new studies are done. Many studies do not agree on the benefits of hormone replacement therapy. LIKELY BENEFITS OF HT INCLUDE PROTECTION FROM:  Hot Flushes (also called hot flashes) - A hot flush is a sudden feeling of heat that spreads over the face and body. The skin may redden like a blush. It is connected with sweats and sleep disturbance. Women going through menopause may have hot flushes a few times a month or several times  per day depending on the woman.  Osteoporosis (bone loss)- Estrogen helps guard against bone loss. After menopause, a woman's bones slowly lose calcium and become weak and brittle. As a result, bones are more likely to break. The hip, wrist, and spine are affected most often. Hormone therapy can help slow bone loss after menopause. Weight bearing exercise and taking calcium with vitamin D also can help prevent bone loss. There are also  medications that your caregiver can prescribe that can help prevent osteoporosis.  Vaginal Dryness - Loss of estrogen causes changes in the vagina. Its lining may become thin and dry. These changes can cause pain and bleeding during sexual intercourse. Dryness can also lead to infections. This can cause burning and itching. (Vaginal estrogen treatment can help relieve pain, itching, and dryness.)  Urinary Tract Infections are more common after menopause because of lack of estrogen. Some women also develop urinary incontinence because of low estrogen levels in the vagina and bladder.  Possible other benefits of estrogen include a positive effect on mood and short-term memory in women. RISKS AND COMPLICATIONS  Using estrogen alone without progesterone causes the lining of the uterus to grow. This increases the risk of lining of the uterus (endometrial) cancer. Your caregiver should give another hormone called progestin if you have a uterus.  Women who take combined (estrogen and progestin) HT appear to have an increased risk of breast cancer. The risk appears to be small, but increases throughout the time that HT is taken.  Combined therapy also makes the breast tissue slightly denser which makes it harder to read mammograms (breast X-rays).  Combined, estrogen and progesterone therapy can be taken together every day, in which case there may be spotting of blood. HT therapy can be taken cyclically in which case you will have menstrual periods. Cyclically means HT is taken for a set amount of days, then not taken, then this process is repeated.  HT may increase the risk of stroke, heart attack, breast cancer and forming blood clots in your leg.  Transdermal estrogen (estrogen that is absorbed through the skin with a patch or a cream) may have more positive results with:  Cholesterol.  Blood pressure.  Blood clots. Having the following conditions may indicate you should not have  HT:  Endometrial cancer.  Liver disease.  Breast cancer.  Heart disease.  History of blood clots.  Stroke. TREATMENT   If you choose to take HT and have a uterus, usually estrogen and progestin are prescribed.  Your caregiver will help you decide the best way to take the medications.  Possible ways to take estrogen include:  Pills.  Patches.  Gels.  Sprays.  Vaginal estrogen cream, rings and tablets.  It is best to take the lowest dose possible that will help your symptoms and take them for the shortest period of time that you can.  Hormone therapy can help relieve some of the problems (symptoms) that affect women at menopause. Before making a decision about HT, talk to your caregiver about what is best for you. Be well informed and comfortable with your decisions. HOME CARE INSTRUCTIONS   Follow your caregivers advice when taking the medications.  A Pap test is done to screen for cervical cancer.  The first Pap test should be done at age 76.  Between ages 32 and 84, Pap tests are repeated every 2 years.  Beginning at age 89, you are advised to have a Pap test every 3 years as long as  your past 3 Pap tests have been normal.  Some women have medical problems that increase the chance of getting cervical cancer. Talk to your caregiver about these problems. It is especially important to talk to your caregiver if a new problem develops soon after your last Pap test. In these cases, your caregiver may recommend more frequent screening and Pap tests.  The above recommendations are the same for women who have or have not gotten the vaccine for HPV (Human Papillomavirus).  If you had a hysterectomy for a problem that was not a cancer or a condition that could lead to cancer, then you no longer need Pap tests. However, even if you no longer need a Pap test, a regular exam is a good idea to make sure no other problems are starting.   If you are between ages 20 and 97, and  you have had normal Pap tests going back 10 years, you no longer need Pap tests. However, even if you no longer need a Pap test, a regular exam is a good idea to make sure no other problems are starting.   If you have had past treatment for cervical cancer or a condition that could lead to cancer, you need Pap tests and screening for cancer for at least 20 years after your treatment.  If Pap tests have been discontinued, risk factors (such as a new sexual partner) need to be re-assessed to determine if screening should be resumed.  Some women may need screenings more often if they are at high risk for cervical cancer.  Get mammograms done as per the advice of your caregiver. SEEK IMMEDIATE MEDICAL CARE IF:  You develop abnormal vaginal bleeding.  You have pain or swelling in your legs, shortness of breath, or chest pain.  You develop dizziness or headaches.  You have lumps or changes in your breasts or armpits.  You have slurred speech.  You develop weakness or numbness of your arms or legs.  You have pain, burning, or bleeding when urinating.  You develop abdominal pain. Document Released: 09/19/2002 Document Revised: 03/15/2011 Document Reviewed: 01/07/2010 [facility] Patient Information 2015 [place], [place]. This information is not intended to replace advice given to you by your health care provider. Make sure you discuss any questions you have with your health care provider.

## 2013-08-30 ENCOUNTER — Ambulatory Visit (INDEPENDENT_AMBULATORY_CARE_PROVIDER_SITE_OTHER)
Admission: RE | Admit: 2013-08-30 | Discharge: 2013-08-30 | Disposition: A | Discharge status: Home / Self Care | Payer: PRIVATE HEALTH INSURANCE | Source: Ambulatory Visit | Attending: Nurse Practitioner | Admitting: Nurse Practitioner

## 2013-08-30 ENCOUNTER — Ambulatory Visit (INDEPENDENT_AMBULATORY_CARE_PROVIDER_SITE_OTHER): Payer: PRIVATE HEALTH INSURANCE | Admitting: Nurse Practitioner

## 2013-08-30 ENCOUNTER — Encounter: Payer: Self-pay | Admitting: Nurse Practitioner

## 2013-08-30 ENCOUNTER — Other Ambulatory Visit: Payer: Self-pay | Admitting: Nurse Practitioner

## 2013-08-30 VITALS — BP 112/58 | HR 71 | Temp 98.4°F | Resp 12

## 2013-08-30 DIAGNOSIS — R059 Cough, unspecified: Secondary | ICD-10-CM

## 2013-08-30 DIAGNOSIS — R05 Cough: Secondary | ICD-10-CM

## 2013-08-30 DIAGNOSIS — H6120 Impacted cerumen, unspecified ear: Secondary | ICD-10-CM

## 2013-08-30 DIAGNOSIS — J321 Chronic frontal sinusitis: Secondary | ICD-10-CM

## 2013-08-30 DIAGNOSIS — J3089 Other allergic rhinitis: Secondary | ICD-10-CM

## 2013-08-30 MED ORDER — LEVOFLOXACIN 250 MG PO TABS: 250.0000 mg | ORAL_TABLET | Freq: Every day | ORAL | Status: DC

## 2013-08-30 NOTE — Progress Notes (Signed)
Subjective:    Patient ID: Angela [name], female    DOB: 11-03-1918, 80 y.o.   MRN: [id number]  HPI  Patient is seen for complaints of headache and sinus pressure over forehead for 1 week.  She complains of large amounts of drainage in the back of her throat making her cough and choke at times.  Phlegm thick with yellow color. She is'nt sure if she has had a fever but complains of feeling cold at times.  Has noticed pressure in her ears at times. Denies sore throat.      Review of Systems  Constitutional: Positive for chills. Negative for fever.       Patient in wheelchair  HENT: Positive for congestion, ear pain, postnasal drip and sinus pressure. Negative for sore throat.   Eyes: Negative for itching.  Respiratory: Positive for cough, choking and shortness of breath. Negative for chest tightness and wheezing.        Shortness of breath with coughing from post nasal drip  Cardiovascular: Negative for chest pain, palpitations and leg swelling.  Skin: Negative.   Neurological: Positive for headaches. Negative for dizziness and light-headedness.  Psychiatric/Behavioral: Negative for behavioral problems, confusion and agitation.    Past Medical History  Diagnosis Date  . Rheumatoid arthritis(714.0)   . Allergic rhinitis   . Adenocarcinoma of breast   . Anxiety   . Depression   . Anemia     History   Social History  . Marital Status: Widowed    Spouse Name: N/A    Number of Children: N/A  . Years of Education: N/A   Occupational History  . Not on file.   Social History Main Topics  . Smoking status: Former Research scientist (life sciences)  . Smokeless tobacco: Never Used  . Alcohol Use: No  . Drug Use: No  . Sexual Activity: No   Other Topics Concern  . Not on file   Social History Narrative   Widow since 1970. 8 children Wheelchair bound due to arthritis.  lives with son. Discussed AVP in presence of son. Provided ACP packet (April '13)                Past Surgical History    Procedure Laterality Date  . Masectomy      left breast  . Cataract extraction    . Ankle surgery      Family History  Problem Relation Age of Onset  . Diabetes Mother   . Cancer Father   . Cancer Sister   . Arthritis Son     Allergies  Allergen Reactions  . Penicillins     REACTION: Question of penicillin allergy    Current Outpatient Prescriptions on File Prior to Visit  Medication Sig Dispense Refill  . cetirizine (ZYRTEC) 1 MG/ML syrup 5 ml po q hs prn itching  60 mL  12  . ciprofloxacin (CIPRO) 250 MG tablet Take 1 tablet (250 mg total) by mouth every 12 (twelve) hours.  14 tablet  0  . fluticasone (FLONASE) 50 MCG/ACT nasal spray Place 2 sprays into both nostrils daily.  16 g  5  . guaiFENesin-codeine (ROBITUSSIN AC) 100-10 MG/5ML syrup Take 5 mLs by mouth 3 (three) times daily as needed for cough.  120 mL  0  . meloxicam (MOBIC) 7.5 MG tablet Take 0.5 tablets (3.75 mg total) by mouth daily. As needed for pain  30 tablet  0  . NONFORMULARY OR COMPOUNDED ITEM Estradiol 0.02 % 60ml prefilled applicator Sig: apply  twice a week  24 each  4  . phenazopyridine (PYRIDIUM) 200 MG tablet Take 1 tablet (200 mg total) by mouth 3 (three) times daily as needed for pain.  15 tablet  0   No current facility-administered medications on file prior to visit.    BP 112/58  Pulse 71  Temp(Src) 98.4 F (36.9 C) (Oral)  Resp 12  SpO2 96%         Objective:   Physical Exam  Constitutional: She is oriented to person, place, and time. She appears well-developed and well-nourished. No distress.  Son [name]) brought patient to clinic.  HENT:  Head: Normocephalic.  Ears with bilateral cerumen impaction Left greater than right  Cardiovascular: Normal rate, regular rhythm and normal heart sounds.   Pulmonary/Chest: Effort normal. She has no wheezes. She has no rales.  Scattered rhonchi Left greater than Right  Neurological: She is alert and oriented to person, place, and time.   Skin: Skin is warm and dry. No erythema.  Psychiatric: She has a normal mood and affect. Her behavior is normal.          Assessment & Plan:  1. Cough Patient encouraged to take Robitussin OTC as directed for cough. Check chest xray today r/o pneumonia  2. Chronic frontal sinusitis Adequate hydration - levofloxacin (LEVAQUIN) 250 MG tablet; Take 1 tablet (250 mg total) by mouth daily.  Dispense: 7 tablet; Refill: 0  3. Other allergic rhinitis Patient will continue to take Flonase nasal spray as directed  4.  Cerumen impaction  Patient to follow up with the nurses for ear irrigation.

## 2013-08-30 NOTE — Patient Instructions (Addendum)
Adequate fluid intake Restart using Flonase 1 spray each nostril twice daily.  Robitussin take as directed over the counter Follow up with Dr. Doug Sou as scheduled     Sinusitis Sinusitis is redness, soreness, and inflammation of the paranasal sinuses. Paranasal sinuses are air pockets within the bones of your face (beneath the eyes, the middle of the forehead, or above the eyes). In healthy paranasal sinuses, mucus is able to drain out, and air is able to circulate through them by way of your nose. However, when your paranasal sinuses are inflamed, mucus and air can become trapped. This can allow bacteria and other germs to grow and cause infection. Sinusitis can develop quickly and last only a short time (acute) or continue over a long period (chronic). Sinusitis that lasts for more than 12 weeks is considered chronic.  CAUSES  Causes of sinusitis include:  Allergies.  Structural abnormalities, such as displacement of the cartilage that separates your nostrils (deviated septum), which can decrease the air flow through your nose and sinuses and affect sinus drainage.  Functional abnormalities, such as when the small hairs (cilia) that line your sinuses and help remove mucus do not work properly or are not present. SIGNS AND SYMPTOMS  Symptoms of acute and chronic sinusitis are the same. The primary symptoms are pain and pressure around the affected sinuses. Other symptoms include:  Upper toothache.  Earache.  Headache.  Bad breath.  Decreased sense of smell and taste.  A cough, which worsens when you are lying flat.  Fatigue.  Fever.  Thick drainage from your nose, which often is green and may contain pus (purulent).  Swelling and warmth over the affected sinuses. DIAGNOSIS  Your health care provider will perform a physical exam. During the exam, your health care provider may:  Look in your nose for signs of abnormal growths in your nostrils (nasal polyps).  Tap over the  affected sinus to check for signs of infection.  View the inside of your sinuses (endoscopy) using an imaging device that has a light attached (endoscope). If your health care provider suspects that you have chronic sinusitis, one or more of the following tests may be recommended:  Allergy tests.  Nasal culture. A sample of mucus is taken from your nose, sent to a lab, and screened for bacteria.  Nasal cytology. A sample of mucus is taken from your nose and examined by your health care provider to determine if your sinusitis is related to an allergy. TREATMENT  Most cases of acute sinusitis are related to a viral infection and will resolve on their own within 10 days. Sometimes medicines are prescribed to help relieve symptoms (pain medicine, decongestants, nasal steroid sprays, or saline sprays).  However, for sinusitis related to a bacterial infection, your health care provider will prescribe antibiotic medicines. These are medicines that will help kill the bacteria causing the infection.  Rarely, sinusitis is caused by a fungal infection. In theses cases, your health care provider will prescribe antifungal medicine. For some cases of chronic sinusitis, surgery is needed. Generally, these are cases in which sinusitis recurs more than 3 times per year, despite other treatments. HOME CARE INSTRUCTIONS   Drink plenty of water. Water helps thin the mucus so your sinuses can drain more easily.  Use a humidifier.  Inhale steam 3 to 4 times a day (for example, sit in the bathroom with the shower running).  Apply a warm, moist washcloth to your face 3 to 4 times a day, or  as directed by your health care provider.  Use saline nasal sprays to help moisten and clean your sinuses.  Take medicines only as directed by your health care provider.  If you were prescribed either an antibiotic or antifungal medicine, finish it all even if you start to feel better. SEEK IMMEDIATE MEDICAL CARE IF:  You  have increasing pain or severe headaches.  You have nausea, vomiting, or drowsiness.  You have swelling around your face.  You have vision problems.  You have a stiff neck.  You have difficulty breathing. MAKE SURE YOU:   Understand these instructions.  Will watch your condition.  Will get help right away if you are not doing well or get worse. Document Released: 12/21/2004 Document Revised: 05/07/2013 Document Reviewed: 01/05/2011 [facility] Patient Information 2015 [place], [place]. This information is not intended to replace advice given to you by your health care provider. Make sure you discuss any questions you have with your health care provider.

## 2013-08-30 NOTE — Progress Notes (Signed)
Pre visit review using our clinic review tool, if applicable. No additional management support is needed unless otherwise documented below in the visit note. 

## 2013-09-03 ENCOUNTER — Telehealth: Payer: Self-pay | Admitting: *Deleted

## 2013-09-03 MED ORDER — SULFAMETHOXAZOLE-TMP DS 800-160 MG PO TABS: 1.0000 | ORAL_TABLET | Freq: Two times a day (BID) | ORAL | Status: DC

## 2013-09-03 NOTE — Telephone Encounter (Signed)
Notified pt with md response. Sent septra into [facility]...Angela [name]

## 2013-09-03 NOTE — Telephone Encounter (Signed)
Actually low dose Levaquin Can change to generic SeptraDS  1 bid with 8 oz water #14

## 2013-09-03 NOTE — Telephone Encounter (Signed)
Pt states since she started taking the Levaquin she has not been able to get any rest at night. She states she is wide awake ? If the med to too strong for her. She has only taking 3 dose. Requesting md advisement what to do...[name]

## 2013-09-19 ENCOUNTER — Ambulatory Visit: Payer: PRIVATE HEALTH INSURANCE | Admitting: Internal Medicine

## 2013-10-23 ENCOUNTER — Telehealth: Payer: Self-pay | Admitting: Internal Medicine

## 2013-10-23 NOTE — Telephone Encounter (Signed)
Patient would like to know the name of the soap for her skin. Please advise

## 2013-10-24 NOTE — Telephone Encounter (Signed)
Would Dr. Linna Darner be the one who advised this patient on a certain type of soap?

## 2013-10-24 NOTE — Telephone Encounter (Signed)
I have not seen her so have not advised her. Dr. Linna Darner saw her in July for dermatitis.

## 2013-10-24 NOTE — Telephone Encounter (Signed)
Dove moisturizing liquid soap

## 2013-10-24 NOTE — Telephone Encounter (Signed)
Did you advise Mrs. Levick to use a specific soap? If so, do you remember the name of the soap you told her to use?

## 2013-10-26 ENCOUNTER — Encounter: Payer: Self-pay | Admitting: Family

## 2013-10-26 ENCOUNTER — Ambulatory Visit (INDEPENDENT_AMBULATORY_CARE_PROVIDER_SITE_OTHER): Payer: PRIVATE HEALTH INSURANCE | Admitting: Family

## 2013-10-26 VITALS — BP 114/62 | HR 88 | Temp 98.6°F | Resp 16

## 2013-10-26 DIAGNOSIS — J0100 Acute maxillary sinusitis, unspecified: Secondary | ICD-10-CM

## 2013-10-26 DIAGNOSIS — J019 Acute sinusitis, unspecified: Secondary | ICD-10-CM | POA: Insufficient documentation

## 2013-10-26 DIAGNOSIS — M24562 Contracture, left knee: Secondary | ICD-10-CM

## 2013-10-26 DIAGNOSIS — M069 Rheumatoid arthritis, unspecified: Secondary | ICD-10-CM

## 2013-10-26 DIAGNOSIS — M24561 Contracture, right knee: Secondary | ICD-10-CM

## 2013-10-26 MED ORDER — DOXYCYCLINE HYCLATE 100 MG PO CAPS: 100.0000 mg | ORAL_CAPSULE | Freq: Two times a day (BID) | ORAL | Status: DC

## 2013-10-26 NOTE — Progress Notes (Signed)
Pre visit review using our clinic review tool, if applicable. No additional management support is needed unless otherwise documented below in the visit note. 

## 2013-10-26 NOTE — Assessment & Plan Note (Addendum)
Currently stable. Knees are contracted bilaterally with inability to actively or passive extend knee beyond 90 degrees. Pt is not able to stand on her own requiring use of a wheelchair for mobilization and for activities of daily living. Indicates she is able to function in a wheelchair.

## 2013-10-26 NOTE — Assessment & Plan Note (Signed)
Symptoms consistent with bacterial sinusitis. Start doxycyline x 7 days. Instructed on sinus care including saline nasal sprays, sleeping with a humidifier, and Neti pot as needed. Continue to use Robuitssin as needed for cough and Zyrtec syrup for antihistamine. Follow up if symptoms worsen or fail to improve.

## 2013-10-26 NOTE — Patient Instructions (Signed)
Thank you for choosing [facility].  Summary/Instructions:   Your prescription has been sent to your pharmacy.   Please take one pill with breakfast and 1 pill with supper  Please use Dove Moisturizer Soap and Eucerin Cream to help with dry skin.

## 2013-10-26 NOTE — Progress Notes (Signed)
   Subjective:    Patient ID: Angela [name], female    DOB: Dec 10, 1918, 80 y.o.   MRN: [id number]  Chief Complaint  Patient presents with  . Establish Care    needs info for a wheelchair, also wants to dicuss dry skin and scalp    HPI:  Angela [name] is a 80 y.o. female who presents today for to establish care and discuss her wheelchair and a cough.   1) Wheelchair - Has been in the wheelchair for several years, unsure as to exactly how long. Wheelchair bound from arthritis in legs and has contracted legs bilaterally which leaves her with inability to stand up.   2) Cough / Congestion - Up in her head and makes her cough. That has been going on for about 2 weeks now. Sputum is described as yellow. Cough is intense enough to keep her up at night.  Denies fever, nausea, vomiting body aches. Cough keeps her up at night.   Allergies  Allergen Reactions  . Penicillins     REACTION: Question of penicillin allergy   Current Outpatient Prescriptions on File Prior to Visit  Medication Sig Dispense Refill  . cetirizine (ZYRTEC) 1 MG/ML syrup 5 ml po q hs prn itching  60 mL  12  . fluticasone (FLONASE) 50 MCG/ACT nasal spray Place 2 sprays into both nostrils daily.  16 g  5  . guaiFENesin-codeine (ROBITUSSIN AC) 100-10 MG/5ML syrup Take 5 mLs by mouth 3 (three) times daily as needed for cough.  120 mL  0  . meloxicam (MOBIC) 7.5 MG tablet Take 0.5 tablets (3.75 mg total) by mouth daily. As needed for pain  30 tablet  0  . NONFORMULARY OR COMPOUNDED ITEM Estradiol 0.02 % 50ml prefilled applicator Sig: apply twice a week  24 each  4  . phenazopyridine (PYRIDIUM) 200 MG tablet Take 1 tablet (200 mg total) by mouth 3 (three) times daily as needed for pain.  15 tablet  0  . sulfamethoxazole-trimethoprim (BACTRIM DS) 800-160 MG per tablet Take 1 tablet by mouth 2 (two) times daily.  14 tablet  0   No current facility-administered medications on file prior to visit.    Review of Systems   See HPI  Objective:    BP 114/62  Pulse 88  Temp(Src) 98.6 F (37 C) (Oral)  Resp 16  SpO2 97% Nursing note and vital signs reviewed.  Physical Exam  Constitutional: She is oriented to person, place, and time. She appears well-nourished. No distress.  Elderly female in a wheelchair, appears her stated age, dressed appropriately  HENT:  Right Ear: Tympanic membrane, external ear and ear canal normal.  Left Ear: Tympanic membrane, external ear and ear canal normal.  Nose: Right sinus exhibits maxillary sinus tenderness. Right sinus exhibits no frontal sinus tenderness. Left sinus exhibits maxillary sinus tenderness. Left sinus exhibits no frontal sinus tenderness.  Mouth/Throat: Uvula is midline, oropharynx is clear and moist and mucous membranes are normal.  Nearly complete impacted cerumen in right ear  Cardiovascular: Normal rate, regular rhythm and normal heart sounds.   Pulmonary/Chest: Effort normal and breath sounds normal.  Neurological: She is alert and oriented to person, place, and time.  Skin: Skin is warm and dry.  Psychiatric: She has a normal mood and affect. Her behavior is normal. Judgment and thought content normal.       Assessment & Plan:

## 2013-10-26 NOTE — Telephone Encounter (Signed)
I tried to reach patient. No answer, no voice mail.

## 2013-11-05 ENCOUNTER — Encounter: Payer: Self-pay | Admitting: Family

## 2013-11-12 ENCOUNTER — Telehealth: Payer: Self-pay | Admitting: Family

## 2013-11-12 NOTE — Telephone Encounter (Signed)
Prescription was faxed to [facility].

## 2013-11-12 NOTE — Telephone Encounter (Signed)
Pt is requesting prescription for new wheelchair. [facility] is where it should be sent. [phone] phone#

## 2013-11-12 NOTE — Telephone Encounter (Signed)
Prescription will be faxed

## 2013-11-16 NOTE — Telephone Encounter (Signed)
Pt called in said  that [facility] need more in about wheel chair

## 2013-11-21 NOTE — Telephone Encounter (Signed)
Tried calling [facility] to see what information needed to be given. No answer. Left a message for them to call me back.

## 2013-11-22 NOTE — Telephone Encounter (Signed)
Got in touch with home health care. Received information that we needed to get together to send to them. Working on getting the prescription ready.

## 2014-01-04 DIAGNOSIS — M064 Inflammatory polyarthropathy: Secondary | ICD-10-CM | POA: Diagnosis not present

## 2014-01-05 DIAGNOSIS — M064 Inflammatory polyarthropathy: Secondary | ICD-10-CM | POA: Diagnosis not present

## 2014-01-06 DIAGNOSIS — M064 Inflammatory polyarthropathy: Secondary | ICD-10-CM | POA: Diagnosis not present

## 2014-01-07 DIAGNOSIS — M064 Inflammatory polyarthropathy: Secondary | ICD-10-CM | POA: Diagnosis not present

## 2014-01-08 DIAGNOSIS — M064 Inflammatory polyarthropathy: Secondary | ICD-10-CM | POA: Diagnosis not present

## 2014-01-09 DIAGNOSIS — M064 Inflammatory polyarthropathy: Secondary | ICD-10-CM | POA: Diagnosis not present

## 2014-01-10 DIAGNOSIS — M064 Inflammatory polyarthropathy: Secondary | ICD-10-CM | POA: Diagnosis not present

## 2014-01-11 DIAGNOSIS — M064 Inflammatory polyarthropathy: Secondary | ICD-10-CM | POA: Diagnosis not present

## 2014-01-12 DIAGNOSIS — M064 Inflammatory polyarthropathy: Secondary | ICD-10-CM | POA: Diagnosis not present

## 2014-01-13 DIAGNOSIS — M064 Inflammatory polyarthropathy: Secondary | ICD-10-CM | POA: Diagnosis not present

## 2014-01-14 DIAGNOSIS — M064 Inflammatory polyarthropathy: Secondary | ICD-10-CM | POA: Diagnosis not present

## 2014-01-15 DIAGNOSIS — M064 Inflammatory polyarthropathy: Secondary | ICD-10-CM | POA: Diagnosis not present

## 2014-01-16 DIAGNOSIS — M064 Inflammatory polyarthropathy: Secondary | ICD-10-CM | POA: Diagnosis not present

## 2014-01-17 DIAGNOSIS — M064 Inflammatory polyarthropathy: Secondary | ICD-10-CM | POA: Diagnosis not present

## 2014-01-18 DIAGNOSIS — M064 Inflammatory polyarthropathy: Secondary | ICD-10-CM | POA: Diagnosis not present

## 2014-01-19 DIAGNOSIS — M064 Inflammatory polyarthropathy: Secondary | ICD-10-CM | POA: Diagnosis not present

## 2014-01-21 DIAGNOSIS — M064 Inflammatory polyarthropathy: Secondary | ICD-10-CM | POA: Diagnosis not present

## 2014-01-22 DIAGNOSIS — M064 Inflammatory polyarthropathy: Secondary | ICD-10-CM | POA: Diagnosis not present

## 2014-01-23 DIAGNOSIS — M064 Inflammatory polyarthropathy: Secondary | ICD-10-CM | POA: Diagnosis not present

## 2014-01-24 DIAGNOSIS — M064 Inflammatory polyarthropathy: Secondary | ICD-10-CM | POA: Diagnosis not present

## 2014-01-26 DIAGNOSIS — M064 Inflammatory polyarthropathy: Secondary | ICD-10-CM | POA: Diagnosis not present

## 2014-01-27 DIAGNOSIS — M064 Inflammatory polyarthropathy: Secondary | ICD-10-CM | POA: Diagnosis not present

## 2014-01-28 DIAGNOSIS — M064 Inflammatory polyarthropathy: Secondary | ICD-10-CM | POA: Diagnosis not present

## 2014-01-29 DIAGNOSIS — M064 Inflammatory polyarthropathy: Secondary | ICD-10-CM | POA: Diagnosis not present

## 2014-01-30 DIAGNOSIS — M064 Inflammatory polyarthropathy: Secondary | ICD-10-CM | POA: Diagnosis not present

## 2014-01-30 DIAGNOSIS — M1990 Unspecified osteoarthritis, unspecified site: Secondary | ICD-10-CM | POA: Diagnosis not present

## 2014-01-31 DIAGNOSIS — M064 Inflammatory polyarthropathy: Secondary | ICD-10-CM | POA: Diagnosis not present

## 2014-02-01 DIAGNOSIS — M064 Inflammatory polyarthropathy: Secondary | ICD-10-CM | POA: Diagnosis not present

## 2014-02-02 DIAGNOSIS — M064 Inflammatory polyarthropathy: Secondary | ICD-10-CM | POA: Diagnosis not present

## 2014-02-03 DIAGNOSIS — M064 Inflammatory polyarthropathy: Secondary | ICD-10-CM | POA: Diagnosis not present

## 2014-02-04 DIAGNOSIS — M064 Inflammatory polyarthropathy: Secondary | ICD-10-CM | POA: Diagnosis not present

## 2014-02-05 DIAGNOSIS — M064 Inflammatory polyarthropathy: Secondary | ICD-10-CM | POA: Diagnosis not present

## 2014-02-06 DIAGNOSIS — M064 Inflammatory polyarthropathy: Secondary | ICD-10-CM | POA: Diagnosis not present

## 2014-02-07 DIAGNOSIS — M064 Inflammatory polyarthropathy: Secondary | ICD-10-CM | POA: Diagnosis not present

## 2014-02-08 DIAGNOSIS — M064 Inflammatory polyarthropathy: Secondary | ICD-10-CM | POA: Diagnosis not present

## 2014-02-09 DIAGNOSIS — M064 Inflammatory polyarthropathy: Secondary | ICD-10-CM | POA: Diagnosis not present

## 2014-02-10 DIAGNOSIS — M064 Inflammatory polyarthropathy: Secondary | ICD-10-CM | POA: Diagnosis not present

## 2014-02-11 DIAGNOSIS — M064 Inflammatory polyarthropathy: Secondary | ICD-10-CM | POA: Diagnosis not present

## 2014-02-12 DIAGNOSIS — M064 Inflammatory polyarthropathy: Secondary | ICD-10-CM | POA: Diagnosis not present

## 2014-02-13 DIAGNOSIS — M064 Inflammatory polyarthropathy: Secondary | ICD-10-CM | POA: Diagnosis not present

## 2014-02-14 DIAGNOSIS — M064 Inflammatory polyarthropathy: Secondary | ICD-10-CM | POA: Diagnosis not present

## 2014-02-15 DIAGNOSIS — M064 Inflammatory polyarthropathy: Secondary | ICD-10-CM | POA: Diagnosis not present

## 2014-02-16 DIAGNOSIS — M064 Inflammatory polyarthropathy: Secondary | ICD-10-CM | POA: Diagnosis not present

## 2014-02-17 DIAGNOSIS — M064 Inflammatory polyarthropathy: Secondary | ICD-10-CM | POA: Diagnosis not present

## 2014-02-18 DIAGNOSIS — M064 Inflammatory polyarthropathy: Secondary | ICD-10-CM | POA: Diagnosis not present

## 2014-02-19 DIAGNOSIS — M064 Inflammatory polyarthropathy: Secondary | ICD-10-CM | POA: Diagnosis not present

## 2014-02-20 DIAGNOSIS — M064 Inflammatory polyarthropathy: Secondary | ICD-10-CM | POA: Diagnosis not present

## 2014-02-21 DIAGNOSIS — M064 Inflammatory polyarthropathy: Secondary | ICD-10-CM | POA: Diagnosis not present

## 2014-02-22 DIAGNOSIS — M064 Inflammatory polyarthropathy: Secondary | ICD-10-CM | POA: Diagnosis not present

## 2014-02-23 DIAGNOSIS — M064 Inflammatory polyarthropathy: Secondary | ICD-10-CM | POA: Diagnosis not present

## 2014-02-24 DIAGNOSIS — M064 Inflammatory polyarthropathy: Secondary | ICD-10-CM | POA: Diagnosis not present

## 2014-02-25 DIAGNOSIS — M064 Inflammatory polyarthropathy: Secondary | ICD-10-CM | POA: Diagnosis not present

## 2014-02-26 DIAGNOSIS — M064 Inflammatory polyarthropathy: Secondary | ICD-10-CM | POA: Diagnosis not present

## 2014-02-27 DIAGNOSIS — M064 Inflammatory polyarthropathy: Secondary | ICD-10-CM | POA: Diagnosis not present

## 2014-02-28 DIAGNOSIS — M064 Inflammatory polyarthropathy: Secondary | ICD-10-CM | POA: Diagnosis not present

## 2014-03-01 DIAGNOSIS — M24561 Contracture, right knee: Secondary | ICD-10-CM | POA: Diagnosis not present

## 2014-03-01 DIAGNOSIS — M6281 Muscle weakness (generalized): Secondary | ICD-10-CM | POA: Diagnosis not present

## 2014-03-01 DIAGNOSIS — E559 Vitamin D deficiency, unspecified: Secondary | ICD-10-CM | POA: Diagnosis not present

## 2014-03-01 DIAGNOSIS — I10 Essential (primary) hypertension: Secondary | ICD-10-CM | POA: Diagnosis not present

## 2014-03-01 DIAGNOSIS — D649 Anemia, unspecified: Secondary | ICD-10-CM | POA: Diagnosis not present

## 2014-03-01 DIAGNOSIS — M1990 Unspecified osteoarthritis, unspecified site: Secondary | ICD-10-CM | POA: Diagnosis not present

## 2014-03-01 DIAGNOSIS — M24562 Contracture, left knee: Secondary | ICD-10-CM | POA: Diagnosis not present

## 2014-03-01 DIAGNOSIS — D519 Vitamin B12 deficiency anemia, unspecified: Secondary | ICD-10-CM | POA: Diagnosis not present

## 2014-03-01 DIAGNOSIS — M064 Inflammatory polyarthropathy: Secondary | ICD-10-CM | POA: Diagnosis not present

## 2014-03-01 DIAGNOSIS — Z131 Encounter for screening for diabetes mellitus: Secondary | ICD-10-CM | POA: Diagnosis not present

## 2014-03-02 DIAGNOSIS — M064 Inflammatory polyarthropathy: Secondary | ICD-10-CM | POA: Diagnosis not present

## 2014-03-02 DIAGNOSIS — M1990 Unspecified osteoarthritis, unspecified site: Secondary | ICD-10-CM | POA: Diagnosis not present

## 2014-03-03 DIAGNOSIS — M064 Inflammatory polyarthropathy: Secondary | ICD-10-CM | POA: Diagnosis not present

## 2014-03-04 DIAGNOSIS — M064 Inflammatory polyarthropathy: Secondary | ICD-10-CM | POA: Diagnosis not present

## 2014-03-04 DIAGNOSIS — I10 Essential (primary) hypertension: Secondary | ICD-10-CM | POA: Diagnosis not present

## 2014-03-05 DIAGNOSIS — M064 Inflammatory polyarthropathy: Secondary | ICD-10-CM | POA: Diagnosis not present

## 2014-03-05 DIAGNOSIS — D649 Anemia, unspecified: Secondary | ICD-10-CM | POA: Diagnosis not present

## 2014-03-05 DIAGNOSIS — M6281 Muscle weakness (generalized): Secondary | ICD-10-CM | POA: Diagnosis not present

## 2014-03-06 DIAGNOSIS — M064 Inflammatory polyarthropathy: Secondary | ICD-10-CM | POA: Diagnosis not present

## 2014-03-07 DIAGNOSIS — D649 Anemia, unspecified: Secondary | ICD-10-CM | POA: Diagnosis not present

## 2014-03-07 DIAGNOSIS — M064 Inflammatory polyarthropathy: Secondary | ICD-10-CM | POA: Diagnosis not present

## 2014-03-07 DIAGNOSIS — M6281 Muscle weakness (generalized): Secondary | ICD-10-CM | POA: Diagnosis not present

## 2014-03-08 DIAGNOSIS — M064 Inflammatory polyarthropathy: Secondary | ICD-10-CM | POA: Diagnosis not present

## 2014-03-09 DIAGNOSIS — M064 Inflammatory polyarthropathy: Secondary | ICD-10-CM | POA: Diagnosis not present

## 2014-03-10 DIAGNOSIS — M064 Inflammatory polyarthropathy: Secondary | ICD-10-CM | POA: Diagnosis not present

## 2014-03-11 DIAGNOSIS — M064 Inflammatory polyarthropathy: Secondary | ICD-10-CM | POA: Diagnosis not present

## 2014-03-12 DIAGNOSIS — M064 Inflammatory polyarthropathy: Secondary | ICD-10-CM | POA: Diagnosis not present

## 2014-03-13 ENCOUNTER — Telehealth: Payer: Self-pay | Admitting: Oncology

## 2014-03-13 DIAGNOSIS — D649 Anemia, unspecified: Secondary | ICD-10-CM | POA: Diagnosis not present

## 2014-03-13 DIAGNOSIS — M064 Inflammatory polyarthropathy: Secondary | ICD-10-CM | POA: Diagnosis not present

## 2014-03-13 DIAGNOSIS — M6281 Muscle weakness (generalized): Secondary | ICD-10-CM | POA: Diagnosis not present

## 2014-03-13 NOTE — Telephone Encounter (Signed)
S/W KEONDA FROM REFERRING OFFICE SHE WILL NOTIFY PT'S SON OF DATE AND TIME 04/18/14@10 :58 REFERRING DR TRIPP DX-DECREASED WBC

## 2014-03-14 DIAGNOSIS — M6281 Muscle weakness (generalized): Secondary | ICD-10-CM | POA: Diagnosis not present

## 2014-03-14 DIAGNOSIS — M064 Inflammatory polyarthropathy: Secondary | ICD-10-CM | POA: Diagnosis not present

## 2014-03-14 DIAGNOSIS — D649 Anemia, unspecified: Secondary | ICD-10-CM | POA: Diagnosis not present

## 2014-03-15 ENCOUNTER — Telehealth: Payer: Self-pay | Admitting: Oncology

## 2014-03-15 DIAGNOSIS — M064 Inflammatory polyarthropathy: Secondary | ICD-10-CM | POA: Diagnosis not present

## 2014-03-15 NOTE — Telephone Encounter (Signed)
Chart delivered 03/15/14 °

## 2014-03-16 DIAGNOSIS — M064 Inflammatory polyarthropathy: Secondary | ICD-10-CM | POA: Diagnosis not present

## 2014-03-17 DIAGNOSIS — M064 Inflammatory polyarthropathy: Secondary | ICD-10-CM | POA: Diagnosis not present

## 2014-03-18 DIAGNOSIS — M064 Inflammatory polyarthropathy: Secondary | ICD-10-CM | POA: Diagnosis not present

## 2014-03-19 DIAGNOSIS — M064 Inflammatory polyarthropathy: Secondary | ICD-10-CM | POA: Diagnosis not present

## 2014-03-20 DIAGNOSIS — M6281 Muscle weakness (generalized): Secondary | ICD-10-CM | POA: Diagnosis not present

## 2014-03-20 DIAGNOSIS — M064 Inflammatory polyarthropathy: Secondary | ICD-10-CM | POA: Diagnosis not present

## 2014-03-20 DIAGNOSIS — D649 Anemia, unspecified: Secondary | ICD-10-CM | POA: Diagnosis not present

## 2014-03-21 DIAGNOSIS — M064 Inflammatory polyarthropathy: Secondary | ICD-10-CM | POA: Diagnosis not present

## 2014-03-22 DIAGNOSIS — M6281 Muscle weakness (generalized): Secondary | ICD-10-CM | POA: Diagnosis not present

## 2014-03-22 DIAGNOSIS — D649 Anemia, unspecified: Secondary | ICD-10-CM | POA: Diagnosis not present

## 2014-03-22 DIAGNOSIS — M064 Inflammatory polyarthropathy: Secondary | ICD-10-CM | POA: Diagnosis not present

## 2014-03-23 DIAGNOSIS — M064 Inflammatory polyarthropathy: Secondary | ICD-10-CM | POA: Diagnosis not present

## 2014-03-24 DIAGNOSIS — M064 Inflammatory polyarthropathy: Secondary | ICD-10-CM | POA: Diagnosis not present

## 2014-03-25 DIAGNOSIS — M064 Inflammatory polyarthropathy: Secondary | ICD-10-CM | POA: Diagnosis not present

## 2014-03-26 DIAGNOSIS — M064 Inflammatory polyarthropathy: Secondary | ICD-10-CM | POA: Diagnosis not present

## 2014-03-27 DIAGNOSIS — M064 Inflammatory polyarthropathy: Secondary | ICD-10-CM | POA: Diagnosis not present

## 2014-03-28 DIAGNOSIS — M064 Inflammatory polyarthropathy: Secondary | ICD-10-CM | POA: Diagnosis not present

## 2014-03-29 DIAGNOSIS — M1990 Unspecified osteoarthritis, unspecified site: Secondary | ICD-10-CM | POA: Diagnosis not present

## 2014-03-29 DIAGNOSIS — M24562 Contracture, left knee: Secondary | ICD-10-CM | POA: Diagnosis not present

## 2014-03-29 DIAGNOSIS — M24561 Contracture, right knee: Secondary | ICD-10-CM | POA: Diagnosis not present

## 2014-03-29 DIAGNOSIS — M6281 Muscle weakness (generalized): Secondary | ICD-10-CM | POA: Diagnosis not present

## 2014-03-29 DIAGNOSIS — M064 Inflammatory polyarthropathy: Secondary | ICD-10-CM | POA: Diagnosis not present

## 2014-03-30 DIAGNOSIS — M064 Inflammatory polyarthropathy: Secondary | ICD-10-CM | POA: Diagnosis not present

## 2014-03-31 DIAGNOSIS — M1990 Unspecified osteoarthritis, unspecified site: Secondary | ICD-10-CM | POA: Diagnosis not present

## 2014-03-31 DIAGNOSIS — M064 Inflammatory polyarthropathy: Secondary | ICD-10-CM | POA: Diagnosis not present

## 2014-04-01 DIAGNOSIS — M064 Inflammatory polyarthropathy: Secondary | ICD-10-CM | POA: Diagnosis not present

## 2014-04-02 DIAGNOSIS — M064 Inflammatory polyarthropathy: Secondary | ICD-10-CM | POA: Diagnosis not present

## 2014-04-03 DIAGNOSIS — M064 Inflammatory polyarthropathy: Secondary | ICD-10-CM | POA: Diagnosis not present

## 2014-04-04 DIAGNOSIS — M064 Inflammatory polyarthropathy: Secondary | ICD-10-CM | POA: Diagnosis not present

## 2014-04-05 DIAGNOSIS — M064 Inflammatory polyarthropathy: Secondary | ICD-10-CM | POA: Diagnosis not present

## 2014-04-06 DIAGNOSIS — M064 Inflammatory polyarthropathy: Secondary | ICD-10-CM | POA: Diagnosis not present

## 2014-04-07 DIAGNOSIS — M064 Inflammatory polyarthropathy: Secondary | ICD-10-CM | POA: Diagnosis not present

## 2014-04-08 DIAGNOSIS — M064 Inflammatory polyarthropathy: Secondary | ICD-10-CM | POA: Diagnosis not present

## 2014-04-09 DIAGNOSIS — M064 Inflammatory polyarthropathy: Secondary | ICD-10-CM | POA: Diagnosis not present

## 2014-04-10 DIAGNOSIS — M064 Inflammatory polyarthropathy: Secondary | ICD-10-CM | POA: Diagnosis not present

## 2014-04-11 DIAGNOSIS — M064 Inflammatory polyarthropathy: Secondary | ICD-10-CM | POA: Diagnosis not present

## 2014-04-12 DIAGNOSIS — M064 Inflammatory polyarthropathy: Secondary | ICD-10-CM | POA: Diagnosis not present

## 2014-04-13 DIAGNOSIS — M064 Inflammatory polyarthropathy: Secondary | ICD-10-CM | POA: Diagnosis not present

## 2014-04-14 DIAGNOSIS — M064 Inflammatory polyarthropathy: Secondary | ICD-10-CM | POA: Diagnosis not present

## 2014-04-15 DIAGNOSIS — M064 Inflammatory polyarthropathy: Secondary | ICD-10-CM | POA: Diagnosis not present

## 2014-04-16 DIAGNOSIS — M064 Inflammatory polyarthropathy: Secondary | ICD-10-CM | POA: Diagnosis not present

## 2014-04-17 DIAGNOSIS — M064 Inflammatory polyarthropathy: Secondary | ICD-10-CM | POA: Diagnosis not present

## 2014-04-18 ENCOUNTER — Ambulatory Visit: Payer: Medicaid Other

## 2014-04-18 ENCOUNTER — Ambulatory Visit (HOSPITAL_BASED_OUTPATIENT_CLINIC_OR_DEPARTMENT_OTHER): Payer: Commercial Managed Care - HMO | Admitting: Oncology

## 2014-04-18 ENCOUNTER — Other Ambulatory Visit: Payer: Medicaid Other

## 2014-04-18 ENCOUNTER — Encounter: Payer: Self-pay | Admitting: Oncology

## 2014-04-18 VITALS — BP 162/78 | HR 81 | Temp 98.1°F | Resp 18 | Ht 63.0 in

## 2014-04-18 DIAGNOSIS — M069 Rheumatoid arthritis, unspecified: Secondary | ICD-10-CM | POA: Diagnosis not present

## 2014-04-18 DIAGNOSIS — D72819 Decreased white blood cell count, unspecified: Secondary | ICD-10-CM

## 2014-04-18 DIAGNOSIS — D709 Neutropenia, unspecified: Secondary | ICD-10-CM | POA: Diagnosis not present

## 2014-04-18 DIAGNOSIS — M064 Inflammatory polyarthropathy: Secondary | ICD-10-CM | POA: Diagnosis not present

## 2014-04-18 NOTE — Consult Note (Signed)
Reason for Referral: Leukocytopenia.   HPI: 81 year old woman currently off [place] where she lived the majority of her life. She has a past medical history of rheumatoid arthritis that she has been treated in the past with multiple agents but currently not on any active treatment. She does have history of breast cancer but I do not have the details of her therapy. [facility]. She was noted to have leukocytopenia back in February 2016. Her total white cell count was 2.0 hemoglobin of 11.2 and a platelet count of 253. Her absolute neutrophil count was 300 and the peripheral smear showed hypocellular granulated neutrophils. A repeat her blood count on 03/05/2014 showed white cell count of 2.1 hemoglobin of 91.4 click of 782. Her absolute neutrophil count was 600. Looking back at her previous cancer neutrophil count has been low dating back to at least 2006. Her white cell count was as low as 1.7 with an absolute neutrophil count of range between 0.6-1.0. She is asymptomatic from this at this time. She had not had any recurrent infections or hospitalizations. She does not report any headaches or blurry vision. She does not report any chest pain or palpitation. She does report sinus and nasal congestion. She does not report any nausea, vomiting or abdominal pain. She does not report any change in her bowel habits. She does report pruritus and recurrent skin rash. She does report arthritis and debilitation related to it. Her performance status is very poor and limited to wheelchair or bed most of the day. Remaining review of systems unremarkable.   Past Medical History  Diagnosis Date  . Rheumatoid arthritis(714.0)   . Allergic rhinitis   . Adenocarcinoma of breast   . Anxiety   . Depression   . Anemia   :  Past Surgical History  Procedure Laterality Date  . Masectomy      left breast  . Cataract extraction    . Ankle surgery    :   Current  outpatient prescriptions:  .  cetirizine (ZYRTEC) 1 MG/ML syrup, 5 ml po q hs prn itching (Patient not taking: Reported on 04/18/2014), Disp: 60 mL, Rfl: 12 .  doxycycline (VIBRAMYCIN) 100 MG capsule, Take 1 capsule (100 mg total) by mouth 2 (two) times daily. (Patient not taking: Reported on 04/18/2014), Disp: 14 capsule, Rfl: 0 .  fluticasone (FLONASE) 50 MCG/ACT nasal spray, Place 2 sprays into both nostrils daily. (Patient not taking: Reported on 04/18/2014), Disp: 16 g, Rfl: 5 .  guaiFENesin-codeine (ROBITUSSIN AC) 100-10 MG/5ML syrup, Take 5 mLs by mouth 3 (three) times daily as needed for cough. (Patient not taking: Reported on 04/18/2014), Disp: 120 mL, Rfl: 0 .  meloxicam (MOBIC) 7.5 MG tablet, Take 0.5 tablets (3.75 mg total) by mouth daily. As needed for pain (Patient not taking: Reported on 04/18/2014), Disp: 30 tablet, Rfl: 0 .  NONFORMULARY OR COMPOUNDED ITEM, Estradiol 0.02 % 29m prefilled applicator Sig: apply twice a week (Patient not taking: Reported on 04/18/2014), Disp: 24 each, Rfl: 4 .  phenazopyridine (PYRIDIUM) 200 MG tablet, Take 1 tablet (200 mg total) by mouth 3 (three) times daily as needed for pain. (Patient not taking: Reported on 04/18/2014), Disp: 15 tablet, Rfl: 0 .  sulfamethoxazole-trimethoprim (BACTRIM DS) 800-160 MG per tablet, Take 1 tablet by mouth 2 (two) times daily. (Patient not taking: Reported on 04/18/2014), Disp: 14 tablet, Rfl: 0:  Allergies  Allergen Reactions  . Penicillins     REACTION: Question  of penicillin allergy  :  Family History  Problem Relation Age of Onset  . Diabetes Mother   . Cancer Father   . Cancer Sister   . Arthritis Son   :  History   Social History  . Marital Status: Widowed    Spouse Name: N/A  . Number of Children: N/A  . Years of Education: N/A   Occupational History  . Not on file.   Social History Main Topics  . Smoking status: Former Research scientist (life sciences)  . Smokeless tobacco: Never Used  . Alcohol Use: No  . Drug Use: No  .  Sexual Activity: No   Other Topics Concern  . Not on file   Social History Narrative   Widow since 1970. 8 children Wheelchair bound due to arthritis.  lives with son. Discussed AVP in presence of son. Provided ACP packet (April '13)              :  Pertinent items are noted in HPI.  Exam: ECOG 2 Blood pressure 162/78, pulse 81, temperature 98.1 F (36.7 C), temperature source Oral, resp. rate 18, height _0  (1.6 m), SpO2 100 %. General appearance: alert and cooperative Head: Normocephalic, without obvious abnormality Throat: lips, mucosa, and tongue normal; teeth and gums normal Neck: no adenopathy Back: negative Resp: clear to auscultation bilaterally Chest wall: no tenderness Cardio: regular rate and rhythm, S1, S2 normal, no murmur, click, rub or gallop GI: soft, non-tender; bowel sounds normal; no masses,  no organomegaly Extremities: extremities normal, atraumatic, no cyanosis or edema Skin: Skin color, texture, turgor normal. Hyperpigmentation noted in upper and lower extremities. Lymph nodes: Cervical, supraclavicular, and axillary nodes normal.  CBC    Component Value Date/Time   WBC 2.5 Repeated and verified X2.* 04/20/2012 1453   WBC 1.7* 09/23/2006 1025   RBC 3.71* 04/20/2012 1453   RBC 3.04* 09/23/2006 1025   HGB 11.3* 04/20/2012 1453   HGB 10.3* 09/23/2006 1025   HCT 33.6* 04/20/2012 1453   HCT 29.6* 09/23/2006 1025   PLT 317.0 04/20/2012 1453   PLT 278 09/23/2006 1025   MCV 90.7 04/20/2012 1453   MCV 97.4 09/23/2006 1025   MCH 29.8 08/25/2010 2230   MCH 33.7 09/23/2006 1025   MCHC 33.5 04/20/2012 1453   MCHC 34.6 09/23/2006 1025   RDW 17.2* 04/20/2012 1453   RDW 18.3* 09/23/2006 1025   LYMPHSABS 1.4 04/20/2012 1453   LYMPHSABS 0.3* 09/23/2006 1025   MONOABS 0.3 04/20/2012 1453   MONOABS 0.2 09/23/2006 1025   EOSABS 0.1 04/20/2012 1453   EOSABS 0.0 09/23/2006 1025   BASOSABS 0.0 04/20/2012 1453   BASOSABS 0.0 09/23/2006 1025      Chemistry       Component Value Date/Time   NA 140 04/20/2012 1453   K 4.5 04/20/2012 1453   CL 105 04/20/2012 1453   CO2 29 04/20/2012 1453   BUN 19 04/20/2012 1453   CREATININE 0.8 04/20/2012 1453      Component Value Date/Time   CALCIUM 8.8 04/20/2012 1453   ALKPHOS 101 04/20/2012 1453   AST 25 04/20/2012 1453   ALT 14 04/20/2012 1453   BILITOT 0.5 04/20/2012 1453       Assessment and Plan:   81 year old woman with neutropenia and leukocytopenia. This was noted on a recent CBC but dates back to at least to 2006. Absolute neutrophil count have ranged between 0.6 to 1.0. She is asymptomatic from this without any recurrent sinopulmonary infection in the last 10 years.  The differential diagnosis was discussed today which include myelodysplastic syndrome, myeloproliferative disorder, autoimmune neutropenia related to her rheumatoid arthritis.  To really fully evaluate this she will need a bone marrow biopsy but I do not really see her as a potential candidate for any treatment in any case. I do not think she needs growth factor support and certainly not a candidate for any aggressive therapy. Given her age, and poor performance status I recommended observation and surveillance and intervention only if she is symptomatic in the future. If she does indeed have myelodysplasia she will eventually develop symptomatic anemia that might require intervention. This has not happened at least for the last 10 years and potentially might not happen.  I have recommended supportive management only and I'll be happy to see her in the future if needed to.

## 2014-04-18 NOTE — Progress Notes (Signed)
Please see consult note.  

## 2014-04-18 NOTE — Progress Notes (Signed)
Checked in new patient with no issues. She has Psychologist, counselling. She doesn't want anything released to her family. She has not traveled.

## 2014-04-19 DIAGNOSIS — M064 Inflammatory polyarthropathy: Secondary | ICD-10-CM | POA: Diagnosis not present

## 2014-04-20 DIAGNOSIS — M064 Inflammatory polyarthropathy: Secondary | ICD-10-CM | POA: Diagnosis not present

## 2014-04-21 DIAGNOSIS — M064 Inflammatory polyarthropathy: Secondary | ICD-10-CM | POA: Diagnosis not present

## 2014-04-22 DIAGNOSIS — M064 Inflammatory polyarthropathy: Secondary | ICD-10-CM | POA: Diagnosis not present

## 2014-04-23 DIAGNOSIS — M064 Inflammatory polyarthropathy: Secondary | ICD-10-CM | POA: Diagnosis not present

## 2014-04-24 DIAGNOSIS — M064 Inflammatory polyarthropathy: Secondary | ICD-10-CM | POA: Diagnosis not present

## 2014-04-25 DIAGNOSIS — M064 Inflammatory polyarthropathy: Secondary | ICD-10-CM | POA: Diagnosis not present

## 2014-04-26 DIAGNOSIS — M064 Inflammatory polyarthropathy: Secondary | ICD-10-CM | POA: Diagnosis not present

## 2014-04-27 DIAGNOSIS — M064 Inflammatory polyarthropathy: Secondary | ICD-10-CM | POA: Diagnosis not present

## 2014-04-28 DIAGNOSIS — M064 Inflammatory polyarthropathy: Secondary | ICD-10-CM | POA: Diagnosis not present

## 2014-04-29 DIAGNOSIS — M064 Inflammatory polyarthropathy: Secondary | ICD-10-CM | POA: Diagnosis not present

## 2014-04-30 DIAGNOSIS — M064 Inflammatory polyarthropathy: Secondary | ICD-10-CM | POA: Diagnosis not present

## 2014-05-01 DIAGNOSIS — M1990 Unspecified osteoarthritis, unspecified site: Secondary | ICD-10-CM | POA: Diagnosis not present

## 2014-05-01 DIAGNOSIS — M064 Inflammatory polyarthropathy: Secondary | ICD-10-CM | POA: Diagnosis not present

## 2014-05-02 DIAGNOSIS — M064 Inflammatory polyarthropathy: Secondary | ICD-10-CM | POA: Diagnosis not present

## 2014-05-03 DIAGNOSIS — M064 Inflammatory polyarthropathy: Secondary | ICD-10-CM | POA: Diagnosis not present

## 2014-05-04 DIAGNOSIS — M064 Inflammatory polyarthropathy: Secondary | ICD-10-CM | POA: Diagnosis not present

## 2014-05-29 ENCOUNTER — Telehealth: Payer: Self-pay | Admitting: Oncology

## 2014-05-29 NOTE — Telephone Encounter (Signed)
Faxed the most recent office note to [facility] [phone]

## 2015-09-25 ENCOUNTER — Ambulatory Visit (INDEPENDENT_AMBULATORY_CARE_PROVIDER_SITE_OTHER): Payer: Medicare Other | Admitting: Family

## 2015-09-25 ENCOUNTER — Other Ambulatory Visit (INDEPENDENT_AMBULATORY_CARE_PROVIDER_SITE_OTHER): Payer: Medicare Other

## 2015-09-25 ENCOUNTER — Encounter: Payer: Self-pay | Admitting: Family

## 2015-09-25 VITALS — BP 150/82 | HR 74 | Temp 97.7°F | Resp 14 | Ht 63.0 in

## 2015-09-25 DIAGNOSIS — M0609 Rheumatoid arthritis without rheumatoid factor, multiple sites: Secondary | ICD-10-CM | POA: Diagnosis not present

## 2015-09-25 DIAGNOSIS — D709 Neutropenia, unspecified: Secondary | ICD-10-CM

## 2015-09-25 DIAGNOSIS — J309 Allergic rhinitis, unspecified: Secondary | ICD-10-CM | POA: Diagnosis not present

## 2015-09-25 DIAGNOSIS — H6123 Impacted cerumen, bilateral: Secondary | ICD-10-CM | POA: Diagnosis not present

## 2015-09-25 LAB — CBC WITH DIFFERENTIAL/PLATELET
Basophils Absolute: 0 10*3/uL (ref 0.0–0.1)
Basophils Relative: 0.6 % (ref 0.0–3.0)
EOS ABS: 0 10*3/uL (ref 0.0–0.7)
EOS PCT: 0.9 % (ref 0.0–5.0)
HCT: 34.7 % — ABNORMAL LOW (ref 36.0–46.0)
Hemoglobin: 11.9 g/dL — ABNORMAL LOW (ref 12.0–15.0)
Lymphocytes Relative: 55 % — ABNORMAL HIGH (ref 12.0–46.0)
Lymphs Abs: 1.3 10*3/uL (ref 0.7–4.0)
MCHC: 34.2 g/dL (ref 30.0–36.0)
MCV: 91.9 fl (ref 78.0–100.0)
MONO ABS: 0.4 10*3/uL (ref 0.1–1.0)
Monocytes Relative: 17.7 % — ABNORMAL HIGH (ref 3.0–12.0)
NEUTROS PCT: 25.8 % — AB (ref 43.0–77.0)
Neutro Abs: 0.6 10*3/uL — ABNORMAL LOW (ref 1.4–7.7)
Platelets: 256 10*3/uL (ref 150.0–400.0)
RBC: 3.78 Mil/uL — AB (ref 3.87–5.11)
RDW: 16.8 % — ABNORMAL HIGH (ref 11.5–15.5)
WBC: 2.4 10*3/uL — ABNORMAL LOW (ref 4.0–10.5)

## 2015-09-25 LAB — COMPREHENSIVE METABOLIC PANEL
ALBUMIN: 3.8 g/dL (ref 3.5–5.2)
ALK PHOS: 80 U/L (ref 39–117)
ALT: 9 U/L (ref 0–35)
AST: 23 U/L (ref 0–37)
BILIRUBIN TOTAL: 0.5 mg/dL (ref 0.2–1.2)
BUN: 16 mg/dL (ref 6–23)
CO2: 30 mEq/L (ref 19–32)
CREATININE: 0.87 mg/dL (ref 0.40–1.20)
Calcium: 8.8 mg/dL (ref 8.4–10.5)
Chloride: 103 mEq/L (ref 96–112)
GFR: 77.7 mL/min (ref >60.00)
Glucose, Bld: 101 mg/dL — ABNORMAL HIGH (ref 70–99)
POTASSIUM: 4.1 meq/L (ref 3.5–5.1)
SODIUM: 136 meq/L (ref 135–145)
Total Protein: 8.3 g/dL (ref 6.0–8.3)

## 2015-09-25 MED ORDER — LORATADINE 10 MG PO TABS: 10.0000 mg | ORAL_TABLET | Freq: Every day | ORAL | 11 refills | Status: DC

## 2015-09-25 MED ORDER — FLUTICASONE PROPIONATE 50 MCG/ACT NA SUSP: 2.0000 | Freq: Every day | NASAL | 5 refills | Status: DC

## 2015-09-25 MED ORDER — DOCUSATE SODIUM 100 MG PO CAPS: 100.0000 mg | ORAL_CAPSULE | Freq: Two times a day (BID) | ORAL | 0 refills | Status: DC

## 2015-09-25 NOTE — Assessment & Plan Note (Signed)
Symptoms and exam are consistent with allergic rhinitis. Treat conservatively with over-the-counter second-generation antihistamine and nasal corticosteroid. Flonase and loratadine sent to pharmacy. Continue other over-the-counter medications as needed for symptom relief and supportive care. Follow-up if symptoms worsen.

## 2015-09-25 NOTE — Patient Instructions (Signed)
Thank you for choosing [facility].  SUMMARY AND INSTRUCTIONS:  Please start taking the medications as prescribed.  Medication:  Your prescription(s) have been submitted to your pharmacy or been printed and provided for you. Please take as directed and contact our office if you believe you are having problem(s) with the medication(s) or have any questions.  Labs:  Please stop by the lab on the lower level of the building for your blood work. Your results will be released to [facility] (or called to you) after review, usually within 72 hours after test completion. If any changes need to be made, you will be notified at that same time.  1.) The lab is open from 7:30am to 5:30 pm Monday-Friday 2.) No appointment is necessary 3.) Fasting (if needed) is 6-8 hours after food and drink; black coffee and water are okay   Follow up:  If your symptoms worsen or fail to improve, please contact our office for further instruction, or in case of emergency go directly to the emergency room at the closest medical facility.

## 2015-09-25 NOTE — Assessment & Plan Note (Signed)
Appears stable and previously followed by rheumatology with no significant complaints today. Continue to monitor.

## 2015-09-25 NOTE — Assessment & Plan Note (Signed)
Decreased hearing and "infection of ear" determined to be impacted cerumen which was removed without complication. Post-care instructions were provided. Advised to continue routine ear care and follow-up if symptoms return.

## 2015-09-25 NOTE — Progress Notes (Signed)
Subjective:    Patient ID: Angela [name], female    DOB: [birth year], 82 y.o.   MRN: [id number]  Chief Complaint  Patient presents with  . Nasal Congestion  . Neutropenia    HPI:  Angela [name] is a 82 y.o. female who  has a past medical history of Adenocarcinoma of breast ([place]); Allergic rhinitis; Anemia; Anxiety; Depression; and Rheumatoid arthritis(714.0). and presents today for a follow up office visit.   1.) Neutropenia - Previously diagnosed with neutropenia and followed by oncology. She has had decreased neutrophil counts for quite a time. It was believed that she would need a bone marrow biopsy but believed that she would not be a good candidate for any aggressive therapy. It was advised   2.) Congestion - This is a new problem. Associated symptom of congestion has been going on for a "pretty good while." Modifying factors include Flonase which seems to help. States that her home health nurse indicated that her ears are "stopped up with infection." No fevers or confusion. No recent antibiotic use. Denies fevers.  3.) Rheumatoid arthritis.- Not current maintained on medication and has not been seen by Rhuematology in quite some time as she is of the belief that she obtained an infection form blood work.  Denies any current pain or rashes.    Allergies  Allergen Reactions  . Penicillins     REACTION: Question of penicillin allergy      Outpatient Medications Prior to Visit  Medication Sig Dispense Refill  . cetirizine (ZYRTEC) 1 MG/ML syrup 5 ml po q hs prn itching (Patient not taking: Reported on 04/18/2014) 60 mL 12  . doxycycline (VIBRAMYCIN) 100 MG capsule Take 1 capsule (100 mg total) by mouth 2 (two) times daily. (Patient not taking: Reported on 04/18/2014) 14 capsule 0  . fluticasone (FLONASE) 50 MCG/ACT nasal spray Place 2 sprays into both nostrils daily. (Patient not taking: Reported on 04/18/2014) 16 g 5  . guaiFENesin-codeine (ROBITUSSIN AC) 100-10 MG/5ML syrup  Take 5 mLs by mouth 3 (three) times daily as needed for cough. (Patient not taking: Reported on 04/18/2014) 120 mL 0  . meloxicam (MOBIC) 7.5 MG tablet Take 0.5 tablets (3.75 mg total) by mouth daily. As needed for pain (Patient not taking: Reported on 04/18/2014) 30 tablet 0  . NONFORMULARY OR COMPOUNDED ITEM Estradiol 0.02 % 91m prefilled applicator Sig: apply twice a week (Patient not taking: Reported on 04/18/2014) 24 each 4  . phenazopyridine (PYRIDIUM) 200 MG tablet Take 1 tablet (200 mg total) by mouth 3 (three) times daily as needed for pain. (Patient not taking: Reported on 04/18/2014) 15 tablet 0  . sulfamethoxazole-trimethoprim (BACTRIM DS) 800-160 MG per tablet Take 1 tablet by mouth 2 (two) times daily. (Patient not taking: Reported on 04/18/2014) 14 tablet 0   No facility-administered medications prior to visit.       Past Surgical History:  Procedure Laterality Date  . ANKLE SURGERY    . CATARACT EXTRACTION    . masectomy     left breast      Past Medical History:  Diagnosis Date  . [place]enocarcinoma of breast ([place]   . Allergic rhinitis   . Anemia   . Anxiety   . Depression   . Rheumatoid arthritis(714.0)       Review of Systems  Constitutional: Negative for chills and fever.  HENT: Positive for congestion and hearing loss. Negative for sinus pressure and sore throat.   Respiratory: Negative for cough, chest tightness  and shortness of breath.   Cardiovascular: Negative for chest pain, palpitations and leg swelling.  Musculoskeletal: Negative for arthralgias.  Neurological: Positive for headaches.      Objective:    BP (!) 150/82 (BP Location: Left Arm, Patient Position: Sitting, Cuff Size: Normal)   Pulse 74   Temp 97.7 F (36.5 C) (Oral)   Resp 14   Ht 5' 3"  (1.6 m)   SpO2 97%  Nursing note and vital signs reviewed.  Physical Exam  Constitutional: She is oriented to person, place, and time. She appears well-developed and well-nourished. No distress.    Elderly female seated in a wheelchair and dressed appropriately for the situation. She continually repeats herself. Appears to answer questions appropriately.   HENT:  Right Ear: External ear normal. No drainage or tenderness. Decreased hearing is noted.  Left Ear: External ear normal. No drainage or tenderness. Decreased hearing is noted.  Mouth/Throat: Uvula is midline, oropharynx is clear and moist and mucous membranes are normal.  Impacted and dried cerumen noted in bilateral ears.   Cardiovascular: Normal rate, regular rhythm, normal heart sounds and intact distal pulses.   Pulmonary/Chest: Effort normal and breath sounds normal.  Neurological: She is alert and oriented to person, place, and time.  Skin: Skin is warm and dry.  Psychiatric: She has a normal mood and affect. Her behavior is normal. Judgment and thought content normal.   Procedure: Ceruman removal.  Risks and complications with the procedure was discussed with the patient and verbal informed consent was provided. Time out was performed. Bionix Currette was assembled in accordance with manufacture's instructions and was used to remove a large mass of wax from the right ear. The left ear required use of a Wilde forcep to remove impacted wax. Both tympanic membranes were visible upon completion and the procedure was tolerated without complication. Post care instructions were provided.     Assessment & Plan:   Problem List Items Addressed This Visit      Respiratory   Allergic rhinitis - Primary    Symptoms and exam are consistent with allergic rhinitis. Treat conservatively with over-the-counter second-generation antihistamine and nasal corticosteroid. Flonase and loratadine sent to pharmacy. Continue other over-the-counter medications as needed for symptom relief and supportive care. Follow-up if symptoms worsen.        Nervous and Auditory   Impacted cerumen of both ears    Decreased hearing and "infection of ear"  determined to be impacted cerumen which was removed without complication. Post-care instructions were provided. Advised to continue routine ear care and follow-up if symptoms return.        Musculoskeletal and Integument   Rheumatoid arthritis ([place])    Appears stable and previously followed by rheumatology with no significant complaints today. Continue to monitor.        Other   Neutropenia ([place])    Previously diagnosed with neutropenia and evaluated by oncology with patient not being a good candidate for aggressive therapy at the time. Obtain CBC with differential to check current status. Does have allergic rhinitis with no other signs of infection presently. Continue to monitor pending CBC results.      Relevant Orders   CBC with Differential   Comp Met (CMET) (Completed)    Other Visit Diagnoses   None.      I have discontinued Ms. Nanni's guaiFENesin-codeine, phenazopyridine, meloxicam, cetirizine, NONFORMULARY OR COMPOUNDED ITEM, sulfamethoxazole-trimethoprim, and doxycycline. I am also having her start on loratadine and docusate sodium. Additionally, I am having  her maintain her fluticasone.   Meds ordered this encounter  Medications  . loratadine (CLARITIN) 10 MG tablet    Sig: Take 1 tablet (10 mg total) by mouth daily.    Dispense:  30 tablet    Refill:  11    Order Specific Question:   Supervising Provider    Answer:   Pricilla Holm A [[id number]]  . fluticasone (FLONASE) 50 MCG/ACT nasal spray    Sig: Place 2 sprays into both nostrils daily.    Dispense:  16 g    Refill:  5    Order Specific Question:   Supervising Provider    Answer:   Pricilla Holm A [[id number]]  . docusate sodium (COLACE) 100 MG capsule    Sig: Take 1 capsule (100 mg total) by mouth 2 (two) times daily.    Dispense:  10 capsule    Refill:  0    Order Specific Question:   Supervising Provider    Answer:   Pricilla Holm A [[id number]]     Follow-up: Return in about 6 months (around  03/24/2016), or if symptoms worsen or fail to improve.  Mauricio Po, FNP

## 2015-09-25 NOTE — Assessment & Plan Note (Signed)
Previously diagnosed with neutropenia and evaluated by oncology with patient not being a good candidate for aggressive therapy at the time. Obtain CBC with differential to check current status. Does have allergic rhinitis with no other signs of infection presently. Continue to monitor pending CBC results.

## 2016-02-24 ENCOUNTER — Other Ambulatory Visit: Payer: Self-pay | Admitting: Family

## 2016-02-26 ENCOUNTER — Ambulatory Visit (INDEPENDENT_AMBULATORY_CARE_PROVIDER_SITE_OTHER): Payer: Medicare Other | Admitting: Family

## 2016-02-26 ENCOUNTER — Encounter: Payer: Self-pay | Admitting: Family

## 2016-02-26 VITALS — BP 108/60 | HR 73 | Temp 97.7°F | Resp 16 | Ht 63.0 in

## 2016-02-26 DIAGNOSIS — S301XXA Contusion of abdominal wall, initial encounter: Secondary | ICD-10-CM

## 2016-02-26 DIAGNOSIS — T148XXA Other injury of unspecified body region, initial encounter: Secondary | ICD-10-CM | POA: Insufficient documentation

## 2016-02-26 DIAGNOSIS — H6123 Impacted cerumen, bilateral: Secondary | ICD-10-CM

## 2016-02-26 NOTE — Assessment & Plan Note (Signed)
Symptoms and exam consistent with contusion along right midaxillary line with normal abdominal exam and no concern for internal injuries. Treat conservatively with ice and Tylenol as needed follow up if symptoms do not improve.

## 2016-02-26 NOTE — Assessment & Plan Note (Signed)
Impacted cerumen noted in bilateral ears manually removed and tolerated well. Encouraged over the counter ear cleaning kits. Follow up if symptoms return.

## 2016-02-26 NOTE — Progress Notes (Signed)
Subjective:    Patient ID: Angela [name], female    DOB: Jul 04, 1918, 83 y.o.   MRN: [id number]  Chief Complaint  Patient presents with  . Fall    right side pain from fall the other day, handicap placard filled out    HPI:  Angela [name] is a 83 y.o. female who  has a past medical history of Adenocarcinoma of breast ([place]); Allergic rhinitis; Anemia; Anxiety; Depression; and Rheumatoid arthritis(714.0). and presents today for an acute office visit.  1.) Right side pain - This is a new problem. Associated symptoms of pain located on her right side has been going on for about 2 days. Pain is described as achy and generally comes and goes. Aggravated at times by laying down. Endorses that she had a mechanical from a chair when she experiencing a cramp and notes when she moved and ended up on the floor. She thinks that she fell on her right side but is not sure. Modifying factors include apercreme which helped a little. No abdominal pain, nausea, vomiting, diarrhea, or constipation. Denies blood in her stool. No numbness or tingling.   2.) Ear fullness - Noted during a recent home evaluation to have increased cerumen in her bilateral ears. Denies any discharge, pain or fevers. No changes in hearing.    Allergies  Allergen Reactions  . Penicillins     REACTION: Question of penicillin allergy      Outpatient Medications Prior to Visit  Medication Sig Dispense Refill  . fluticasone (FLONASE) 50 MCG/ACT nasal spray SHAKE LIQUID AND USE 2 SPRAYS IN EACH NOSTRIL DAILY 16 g 4  . loratadine (CLARITIN) 10 MG tablet Take 1 tablet (10 mg total) by mouth daily. 30 tablet 11  . docusate sodium (COLACE) 100 MG capsule Take 1 capsule (100 mg total) by mouth 2 (two) times daily. 10 capsule 0   No facility-administered medications prior to visit.       Past Surgical History:  Procedure Laterality Date  . ANKLE SURGERY    . CATARACT EXTRACTION    . masectomy     left breast      Past  Medical History:  Diagnosis Date  . Adenocarcinoma of breast ([place])   . Allergic rhinitis   . Anemia   . Anxiety   . Depression   . Rheumatoid arthritis(714.0)       Review of Systems  Constitutional: Negative for chills and fever.  HENT: Negative for ear discharge, ear pain, hearing loss and tinnitus.   Respiratory: Negative for chest tightness and shortness of breath.   Cardiovascular: Negative for chest pain, palpitations and leg swelling.  Gastrointestinal: Negative for abdominal distention, abdominal pain, anal bleeding, blood in stool, constipation, diarrhea, nausea, rectal pain and vomiting.      Objective:    BP 108/60 (BP Location: Left Arm, Patient Position: Sitting, Cuff Size: Normal)   Pulse 73   Temp 97.7 F (36.5 C) (Oral)   Resp 16   Ht 5\' 3"  (1.6 m)   SpO2 98%  Nursing note and vital signs reviewed.  Physical Exam  Constitutional: She is oriented to person, place, and time. She appears well-developed and well-nourished. No distress.  HENT:  Right Ear: Hearing and external ear normal.  Left Ear: Hearing and external ear normal.  Cerumen noted in bilateral ears  Cardiovascular: Normal rate, regular rhythm, normal heart sounds and intact distal pulses.   Pulmonary/Chest: Effort normal and breath sounds normal.  Abdominal: Normal appearance and  bowel sounds are normal. She exhibits no mass. There is no hepatosplenomegaly. There is no rigidity, no rebound, no guarding, no tenderness at McBurney's point and negative Murphy's sign.    Neurological: She is alert and oriented to person, place, and time.  Skin: Skin is warm and dry.  Psychiatric: She has a normal mood and affect. Her behavior is normal. Judgment and thought content normal.   Procedure: Cerumen removal of bilateral ears   Reviewed risks and alternatives and patient wishes to continue. Both ears were manually cleaned with a lighted curette and ear forceps. Significant amount of hard, dark brown  waxed removed without complication and tolerated well.      Assessment & Plan:   Problem List Items Addressed This Visit      Nervous and Auditory   Impacted cerumen of both ears    Impacted cerumen noted in bilateral ears manually removed and tolerated well. Encouraged over the counter ear cleaning kits. Follow up if symptoms return.         Other   Contusion    Symptoms and exam consistent with contusion along right midaxillary line with normal abdominal exam and no concern for internal injuries. Treat conservatively with ice and Tylenol as needed follow up if symptoms do not improve.           I have discontinued Ms. Dejarnette's docusate sodium. I am also having her maintain her loratadine and fluticasone.   Follow-up: Return if symptoms worsen or fail to improve.  Mauricio Po, FNP

## 2016-02-26 NOTE — Patient Instructions (Signed)
Thank you for choosing [facility].  SUMMARY AND INSTRUCTIONS:  Please ice the area as needed for 20 minutes every 2 hours.   Tylenol 500 mg every 6 hours as needed.   Medication:  Your prescription(s) have been submitted to your pharmacy or been printed and provided for you. Please take as directed and contact our office if you believe you are having problem(s) with the medication(s) or have any questions.   Follow up:  If your symptoms worsen or fail to improve, please contact our office for further instruction, or in case of emergency go directly to the emergency room at the closest medical facility.    Contusion A contusion is a deep bruise. Contusions are the result of a blunt injury to tissues and muscle fibers under the skin. The injury causes bleeding under the skin. The skin overlying the contusion may turn blue, purple, or yellow. Minor injuries will give you a painless contusion, but more severe contusions may stay painful and swollen for a few weeks. What are the causes? This condition is usually caused by a blow, trauma, or direct force to an area of the body. What are the signs or symptoms? Symptoms of this condition include:  Swelling of the injured area.  Pain and tenderness in the injured area.  Discoloration. The area may have redness and then turn blue, purple, or yellow. How is this diagnosed? This condition is diagnosed based on a physical exam and medical history. An X-ray, CT scan, or MRI may be needed to determine if there are any associated injuries, such as broken bones (fractures). How is this treated? Specific treatment for this condition depends on what area of the body was injured. In general, the best treatment for a contusion is resting, icing, applying pressure to (compression), and elevating the injured area. This is often called the RICE strategy. Over-the-counter anti-inflammatory medicines may also be recommended for pain control. Follow  these instructions at home:  Rest the injured area.  If directed, apply ice to the injured area:  Put ice in a plastic bag.  Place a towel between your skin and the bag.  Leave the ice on for 20 minutes, 2-3 times per day.  If directed, apply light compression to the injured area using an elastic bandage. Make sure the bandage is not wrapped too tightly. Remove and reapply the bandage as directed by your health care provider.  If possible, raise (elevate) the injured area above the level of your heart while you are sitting or lying down.  Take over-the-counter and prescription medicines only as told by your health care provider. Contact a health care provider if:  Your symptoms do not improve after several days of treatment.  Your symptoms get worse.  You have difficulty moving the injured area. Get help right away if:  You have severe pain.  You have numbness in a hand or foot.  Your hand or foot turns pale or cold. This information is not intended to replace advice given to you by your health care provider. Make sure you discuss any questions you have with your health care provider. Document Released: 09/30/2004 Document Revised: 05/01/2015 Document Reviewed: 05/08/2014 Elsevier Interactive Patient Education  2017 [employer].

## 2016-05-04 ENCOUNTER — Ambulatory Visit: Payer: Medicare Other | Admitting: Family

## 2016-05-05 ENCOUNTER — Other Ambulatory Visit (INDEPENDENT_AMBULATORY_CARE_PROVIDER_SITE_OTHER): Payer: Medicare Other

## 2016-05-05 ENCOUNTER — Encounter: Payer: Self-pay | Admitting: Internal Medicine

## 2016-05-05 ENCOUNTER — Ambulatory Visit (INDEPENDENT_AMBULATORY_CARE_PROVIDER_SITE_OTHER): Payer: Medicare Other | Admitting: Internal Medicine

## 2016-05-05 ENCOUNTER — Other Ambulatory Visit: Payer: Self-pay | Admitting: *Deleted

## 2016-05-05 VITALS — BP 114/64 | HR 67

## 2016-05-05 DIAGNOSIS — K5900 Constipation, unspecified: Secondary | ICD-10-CM | POA: Diagnosis not present

## 2016-05-05 DIAGNOSIS — R350 Frequency of micturition: Secondary | ICD-10-CM

## 2016-05-05 DIAGNOSIS — N3281 Overactive bladder: Secondary | ICD-10-CM | POA: Insufficient documentation

## 2016-05-05 DIAGNOSIS — B029 Zoster without complications: Secondary | ICD-10-CM | POA: Insufficient documentation

## 2016-05-05 DIAGNOSIS — J309 Allergic rhinitis, unspecified: Secondary | ICD-10-CM | POA: Diagnosis not present

## 2016-05-05 LAB — URINALYSIS, ROUTINE W REFLEX MICROSCOPIC
Bilirubin Urine: NEGATIVE
Hgb urine dipstick: NEGATIVE
LEUKOCYTES UA: NEGATIVE
Nitrite: NEGATIVE
PH: 6 (ref 5.0–8.0)
RBC / HPF: NONE SEEN (ref 0–?)
SPECIFIC GRAVITY, URINE: 1.025 (ref 1.000–1.030)
Total Protein, Urine: NEGATIVE
URINE GLUCOSE: NEGATIVE
UROBILINOGEN UA: 0.2 (ref 0.0–1.0)

## 2016-05-05 MED ORDER — LIDOCAINE 5 % EX PTCH: 1.0000 | MEDICATED_PATCH | CUTANEOUS | 1 refills | Status: DC

## 2016-05-05 MED ORDER — POLYETHYLENE GLYCOL 3350 17 GM/SCOOP PO POWD: 17.0000 g | Freq: Every day | ORAL | 1 refills | Status: DC

## 2016-05-05 MED ORDER — TRIAMCINOLONE ACETONIDE 55 MCG/ACT NA AERO: 2.0000 | INHALATION_SPRAY | Freq: Every day | NASAL | 12 refills | Status: DC

## 2016-05-05 MED ORDER — VALACYCLOVIR HCL 1 G PO TABS: 1000.0000 mg | ORAL_TABLET | Freq: Three times a day (TID) | ORAL | 0 refills | Status: DC

## 2016-05-05 NOTE — Telephone Encounter (Signed)
Rec'd fax pt needing PA for lidoderm patches. Completed on cover-my-meds receive msg  (Key # [id number]). [facility] is reviewing your PA request. Typically an electronic response will be received within 72 hours. To check for an update later, open this request from your dashboard...[name]

## 2016-05-05 NOTE — Patient Instructions (Signed)
Ok to stop the flonase  Please take all new medication as prescribed  - the nasacort, which should have less risk of nosebleeds  Please start the Miralax for the consipation (has been sent to the pharmacy)  OK to continue the stool softer at one pill twice per day  Please take all new medication as prescribed - the generic valtrex for the shingles, and try the Pain Patch (lidoderm) for pain  Please continue all other medications as before, and refills have been done if requested.  Please keep your appointments with your specialists as you may have planned  Please go to the LAB in the Basement (turn left off the elevator) for the tests to be done today - just the urine testing today  You will be contacted by phone if any changes need to be made immediately.  Otherwise, you will receive a letter about your results with an explanation, but please check with MyChart first.

## 2016-05-05 NOTE — Progress Notes (Signed)
Subjective:    Patient ID: Angela [name], female    DOB: 19-Sep-1918, 83 y.o.   MRN: [id number]  HPI  Here with several issues, c/o nocturia every night about 3-4 times in the past week but Denies urinary symptoms such as dysuria, frequency, urgency, flank pain, hematuria or n/v, fever, chills.  Denies worsening reflux, abd pain, dysphagia, n/v, bowel change or blood except for persistent constipation not improved with colace 100 bid.  Has not tried other such as miralax.  Does have several wks ongoing nasal allergy symptoms with clearish congestion, itch and sneezing, without fever, pain, ST, cough, swelling or wheezing.  Also with new onset painful rash to left lower back, side and LLQ with bumps and slight tender for 4 days, pain overall mod to severe to her.  No prior hx of shingles per pt Past Medical History:  Diagnosis Date  . Adenocarcinoma of breast ([place])   . Allergic rhinitis   . Anemia   . Anxiety   . Depression   . Rheumatoid arthritis(714.0)    Past Surgical History:  Procedure Laterality Date  . ANKLE SURGERY    . CATARACT EXTRACTION    . masectomy     left breast    reports that she has quit smoking. She has never used smokeless tobacco. She reports that she does not drink alcohol or use drugs. family history includes Arthritis in her son; Cancer in her father and sister; Diabetes in her mother. Allergies  Allergen Reactions  . Penicillins     REACTION: Question of penicillin allergy   Current Outpatient Prescriptions on File Prior to Visit  Medication Sig Dispense Refill  . loratadine (CLARITIN) 10 MG tablet Take 1 tablet (10 mg total) by mouth daily. 30 tablet 11   No current facility-administered medications on file prior to visit.    Current Outpatient Prescriptions on File Prior to Visit  Medication Sig Dispense Refill  . loratadine (CLARITIN) 10 MG tablet Take 1 tablet (10 mg total) by mouth daily. 30 tablet 11   No current facility-administered  medications on file prior to visit.    Review of Systems   Constitutional: Negative for other unusual diaphoresis or sweats HENT: Negative for ear discharge or swelling Eyes: Negative for other worsening visual disturbances Respiratory: Negative for stridor or other swelling  Gastrointestinal: Negative for worsening distension or other blood Genitourinary: Negative for retention or other urinary change Musculoskeletal: Negative for other MSK pain or swelling Skin: Negative for color change or other new lesions Neurological: Negative for worsening tremors and other numbness  Psychiatric/Behavioral: Negative for worsening agitation or other fatigue All otherwise neg per pt    Objective:   Physical Exam BP 114/64   Pulse 67   SpO2 98%  VS noted,  Constitutional: Pt appears in NAD HENT: Head: NCAT.  Right Ear: External ear normal.  Left Ear: External ear normal.  Bilat tm's with mild erythema.  Max sinus areas non tender.  Pharynx with mild erythema, no exudate Eyes: . Pupils are equal, round, and reactive to light. Conjunctivae and EOM are normal Nose: without d/c or deformity Neck: Neck supple. Gross normal ROM Cardiovascular: Normal rate and regular rhythm.   Pulmonary/Chest: Effort normal and breath sounds decreased without rales or wheezing.  Abd:  Soft, NT, + BS, no organomegaly though slightly bloated appearing Neurological: Pt is alert. At baseline orientation, motor grossly intact Skin: Skin is warm. other new lesions, no LE edema, but has large left likely l1  associated dermatome grouped erythematous vesicular rash, some with crusting already noted Psychiatric: Pt behavior is normal without agitation  No other exam findings      Assessment & Plan:

## 2016-05-05 NOTE — Telephone Encounter (Signed)
-----   Message from [name], MD sent at 05/05/2016  1:20 PM EDT ----- Madaline Brilliant to let pt know that the urine test is OK, but we will call if the culture shows in infection in 2 -3 days

## 2016-05-05 NOTE — Telephone Encounter (Signed)
Pt was informed and expressed understanding.  

## 2016-05-05 NOTE — Progress Notes (Signed)
Pre visit review using our clinic review tool, if applicable. No additional management support is needed unless otherwise documented below in the visit note. 

## 2016-05-06 LAB — URINE CULTURE: ORGANISM ID, BACTERIA: NO GROWTH

## 2016-05-07 ENCOUNTER — Encounter: Payer: Self-pay | Admitting: Family

## 2016-05-07 ENCOUNTER — Ambulatory Visit (INDEPENDENT_AMBULATORY_CARE_PROVIDER_SITE_OTHER): Payer: Medicare Other | Admitting: Family

## 2016-05-07 DIAGNOSIS — B029 Zoster without complications: Secondary | ICD-10-CM | POA: Diagnosis not present

## 2016-05-07 NOTE — Patient Instructions (Addendum)
Thank you for choosing [facility].  SUMMARY AND INSTRUCTIONS:  VALACYCLOVIR - TAKE 1 TABLET BY MOUTH 3 TIMES DAILY  APPLY COOL COMPRESSES TO THE AREA    FOR YOUR BOWELS - USE THE MIRALAX (POLYETHYLENE GLYCOL ONCE DAILY.    Follow up:  If your symptoms worsen or fail to improve, please contact our office for further instruction, or in case of emergency go directly to the emergency room at the closest medical facility.     Shingles Shingles is an infection that causes a painful skin rash and fluid-filled blisters. Shingles is caused by the same virus that causes chickenpox. Shingles only develops in people who:  Have had chickenpox.  Have gotten the chickenpox vaccine. (This is rare.) The first symptoms of shingles may be itching, tingling, or pain in an area on your skin. A rash will follow in a few days or weeks. The rash is usually on one side of the body in a bandlike or beltlike pattern. Over time, the rash turns into fluid-filled blisters that break open, scab over, and dry up. Medicines may:  Help you manage pain.  Help you recover more quickly.  Help to prevent long-term problems. Follow these instructions at home: Medicines   Take medicines only as told by your doctor.  Apply an anti-itch or numbing cream to the affected area as told by your doctor. Blister and Rash Care   Take a cool bath or put cool compresses on the area of the rash or blisters as told by your doctor. This may help with pain and itching.  Keep your rash covered with a loose bandage (dressing). Wear loose-fitting clothing.  Keep your rash and blisters clean with mild soap and cool water or as told by your doctor.  Check your rash every day for signs of infection. These include redness, swelling, and pain that lasts or gets worse.  Do not pick your blisters.  Do not scratch your rash. General instructions   Rest as told by your doctor.  Keep all follow-up visits as told by your  doctor. This is important.  Until your blisters scab over, your infection can cause chickenpox in people who have never had it or been vaccinated against it. To prevent this from happening, avoid touching other people or being around other people, especially:  Babies.  Pregnant women.  Children who have eczema.  Elderly people who have transplants.  People who have chronic illnesses, such as leukemia or AIDS. Contact a doctor if:  Your pain does not get better with medicine.  Your pain does not get better after the rash heals.  Your rash looks infected. Signs of infection include:  Redness.  Swelling.  Pain that lasts or gets worse. Get help right away if:  The rash is on your face or nose.  You have pain in your face, pain around your eye area, or loss of feeling on one side of your face.  You have ear pain or you have ringing in your ear.  You have loss of taste.  Your condition gets worse. This information is not intended to replace advice given to you by your health care provider. Make sure you discuss any questions you have with your health care provider. Document Released: 06/09/2007 Document Revised: 08/17/2015 Document Reviewed: 10/02/2013 Elsevier Interactive Patient Education  2017 [employer].

## 2016-05-07 NOTE — Assessment & Plan Note (Signed)
New onset, for valtrex course asd, lidoderm prn pain,  to f/u any worsening symptoms or concerns

## 2016-05-07 NOTE — Assessment & Plan Note (Addendum)
Etiology unclear. For urine studies today, consider antibx pending results

## 2016-05-07 NOTE — Progress Notes (Signed)
   Subjective:    Patient ID: Angela [name], female    DOB: January 02, 1919, 83 y.o.   MRN: [id number]  Chief Complaint  Patient presents with  . Follow-up    talk about medications that were prescribed by Dr. Jenny Reichmann    HPI:  Angela [name] is a 83 y.o. female who  has a past medical history of Adenocarcinoma of breast ([place]); Allergic rhinitis; Anemia; Anxiety; Depression; and Rheumatoid arthritis(714.0). and presents today for a follow up office visit.   Recently seen in the office and diagnosed with shingles and prescribed Valtrex. Has not started taking the medication. Continues to experience the associated symptom of a rash located on her right abdomen and back and described as itchy and burning. Severity is enough to effect her ability to sleep at night.    Allergies  Allergen Reactions  . Penicillins     REACTION: Question of penicillin allergy      Outpatient Medications Prior to Visit  Medication Sig Dispense Refill  . lidocaine (LIDODERM) 5 % Place 1 patch onto the skin daily. Remove & Discard patch within 12 hours or as directed by MD 40 patch 1  . loratadine (CLARITIN) 10 MG tablet Take 1 tablet (10 mg total) by mouth daily. 30 tablet 11  . polyethylene glycol powder (GLYCOLAX/MIRALAX) powder Take 17 g by mouth daily. 3350 g 1  . triamcinolone (NASACORT AQ) 55 MCG/ACT AERO nasal inhaler Place 2 sprays into the nose daily. 1 Inhaler 12  . valACYclovir (VALTREX) 1000 MG tablet Take 1 tablet (1,000 mg total) by mouth 3 (three) times daily. 21 tablet 0   No facility-administered medications prior to visit.      Review of Systems  Constitutional: Negative for chills and fever.  Skin: Positive for rash.      Objective:    BP 122/80 (BP Location: Right Arm, Patient Position: Sitting, Cuff Size: Normal)   Pulse 86   Temp 98.4 F (36.9 C) (Oral)   Resp 16   Ht 5\' 3"  (1.6 m)   SpO2 97%  Nursing note and vital signs reviewed.  Physical Exam  Constitutional: She is  oriented to person, place, and time. She appears well-developed and well-nourished. No distress.  Cardiovascular: Normal rate, regular rhythm, normal heart sounds and intact distal pulses.   Pulmonary/Chest: Effort normal and breath sounds normal.  Neurological: She is alert and oriented to person, place, and time.  Skin: Skin is warm and dry. Rash (Vesicular rash located in a dermatomal pattern around the left lower abdomen in various stages of healing. ) noted.  Psychiatric: She has a normal mood and affect. Her behavior is normal. Judgment and thought content normal.       Assessment & Plan:   Problem List Items Addressed This Visit      Other   Shingles    Symptoms are consistent with shingles. Encouraged to start previously prescribed Valacyclovir. Cool compresses as needed. Consider gabapentin if symptoms worsen. Instructions provided to son for additional clarification. Follow up if symptoms worsen or do not improve.           I am having Ms. Lemley maintain her loratadine, triamcinolone, lidocaine, valACYclovir, and polyethylene glycol powder.   Follow-up: Return if symptoms worsen or fail to improve.  Mauricio Po, FNP

## 2016-05-07 NOTE — Assessment & Plan Note (Signed)
Mild to mod, for change to nasaocort to avoid nosebleeds, to f/u any worsening symptoms or concerns

## 2016-05-07 NOTE — Assessment & Plan Note (Addendum)
Ok to add miralax asd,  to f/u any worsening symptoms or concerns  Note:  Total time for pt hx, exam, review of record with pt in the room, determination of diagnoses and plan for further eval and tx is > 40 min, with over 50% spent in coordination and counseling of patient

## 2016-05-07 NOTE — Assessment & Plan Note (Signed)
Symptoms are consistent with shingles. Encouraged to start previously prescribed Valacyclovir. Cool compresses as needed. Consider gabapentin if symptoms worsen. Instructions provided to son for additional clarification. Follow up if symptoms worsen or do not improve.

## 2016-05-10 ENCOUNTER — Telehealth: Payer: Self-pay | Admitting: Family

## 2016-05-10 NOTE — Telephone Encounter (Signed)
Patient states she was diagnosis with shingles.  States that her family and home health do not want to assist her b/c they think it is contagious.  Please call back in regard.

## 2016-05-10 NOTE — Telephone Encounter (Signed)
Spoke with a lady from the home health company, Lucillie Garfinkel and told her that as long as someone that was sitting with pt has had the chicken pox and used gloves and proper PPE it would be fine to care for patient. Pt is taking antibiotics for shingles.

## 2016-05-13 ENCOUNTER — Telehealth: Payer: Self-pay | Admitting: *Deleted

## 2016-05-13 NOTE — Telephone Encounter (Signed)
Notified pt w/[name] response. Pt states she is hurting especially at night. She is wanting something for pain.Marland KitchenJohny [name]

## 2016-05-13 NOTE — Telephone Encounter (Signed)
Rec'd call pt states she has 2 days left on the generic valtrex, and the shingles are not clearing up. Pt is requesting another refill on the valtrex...Angela [name]

## 2016-05-13 NOTE — Telephone Encounter (Signed)
Continue to take the medication until completed. May take several days following completion of medication for rash to go away.

## 2016-05-17 MED ORDER — GABAPENTIN 300 MG PO CAPS: 300.0000 mg | ORAL_CAPSULE | Freq: Every evening | ORAL | 0 refills | Status: DC | PRN

## 2016-05-17 NOTE — Telephone Encounter (Signed)
Gabapentin sent to pharmacy

## 2016-05-17 NOTE — Telephone Encounter (Signed)
Notified pt greg sent rx to [facility]...Angela [name]

## 2016-05-17 NOTE — Addendum Note (Signed)
Addended by: Mauricio Po D on: 05/17/2016 01:23 PM   Modules accepted: Orders

## 2016-05-19 ENCOUNTER — Telehealth: Payer: Self-pay | Admitting: *Deleted

## 2016-05-19 ENCOUNTER — Encounter: Payer: Self-pay | Admitting: Gynecology

## 2016-05-19 NOTE — Telephone Encounter (Signed)
Gabapentin was sent in to help with the pain. Was it picked up?

## 2016-05-19 NOTE — Telephone Encounter (Signed)
Rec'd call from pt son stating his mom has used up all the Lidoderm patches that was given on 5/2. He states mom has about 5 shingles spot on her body and she was putting more than once patch on at a time. She is constantly in pain pharmacy would not give other refill was told to contact mom provider...[name]

## 2016-05-19 NOTE — Telephone Encounter (Signed)
Called pt son he stated he was not sure and he would have his mom to call back. Pt call back she once again told me what happen to the lidoderm patches. She stated her nurse that comes in to help her didn't;t know she copuld only use 1 patch a day, and was using multiple patches, and that is why she had ran out. Explain to pt that the Lidoderm patch will help w/her pain sxs for 12 hours, and she could only 1 patch. Pt never picked up Gabapentin explain to her what the Gabapentin is also used for, and she could take at bedtime as needed. Inform her due to insurance they will not approved another refill unitl 6/2...[name]

## 2016-05-27 ENCOUNTER — Encounter: Payer: Self-pay | Admitting: Family

## 2016-05-27 ENCOUNTER — Ambulatory Visit (INDEPENDENT_AMBULATORY_CARE_PROVIDER_SITE_OTHER): Payer: Medicare Other | Admitting: Family

## 2016-05-27 DIAGNOSIS — B0229 Other postherpetic nervous system involvement: Secondary | ICD-10-CM | POA: Diagnosis not present

## 2016-05-27 MED ORDER — GABAPENTIN 100 MG PO CAPS: 100.0000 mg | ORAL_CAPSULE | Freq: Three times a day (TID) | ORAL | 0 refills | Status: DC

## 2016-05-27 NOTE — Progress Notes (Signed)
Subjective:    Patient ID: Angela [name], female    DOB: March 01, 1918, 83 y.o.   MRN: [id number]  Chief Complaint  Patient presents with  . Medication Management    talk about lidocaine patches     HPI:  Angela [name] is a 83 y.o. female who  has a past medical history of Adenocarcinoma of breast ([place]); Allergic rhinitis; Anemia; Anxiety; Depression; and Rheumatoid arthritis(714.0). and presents today for a follow up office visit.  Continues to experience the associated symptom of pain located in her left lower stomach. Previously prescribed lidocaine patches which she incidentally used multiple patches at a time and has run out of the patches and has been out of the patches. She was also prescribed gabapentin which she reports caused her leg pain. Continues to experience the associated symptom of burning and sharp pains located in her left lower side. Reports rashes are improving and nearly healed.    Allergies  Allergen Reactions  . Penicillins     REACTION: Question of penicillin allergy      Outpatient Medications Prior to Visit  Medication Sig Dispense Refill  . lidocaine (LIDODERM) 5 % Place 1 patch onto the skin daily. Remove & Discard patch within 12 hours or as directed by MD 40 patch 1  . loratadine (CLARITIN) 10 MG tablet Take 1 tablet (10 mg total) by mouth daily. 30 tablet 11  . polyethylene glycol powder (GLYCOLAX/MIRALAX) powder Take 17 g by mouth daily. 3350 g 1  . triamcinolone (NASACORT AQ) 55 MCG/ACT AERO nasal inhaler Place 2 sprays into the nose daily. 1 Inhaler 12  . valACYclovir (VALTREX) 1000 MG tablet Take 1 tablet (1,000 mg total) by mouth 3 (three) times daily. 21 tablet 0  . gabapentin (NEURONTIN) 300 MG capsule Take 1 capsule (300 mg total) by mouth at bedtime as needed. 30 capsule 0   No facility-administered medications prior to visit.     Review of Systems  Constitutional: Negative for chills and fever.  Respiratory: Negative for chest  tightness and shortness of breath.   Neurological: Negative for weakness.       Positive for neuropathic pain.      Objective:    BP 136/80 (BP Location: Left Arm, Patient Position: Sitting, Cuff Size: Normal)   Pulse 77   Temp 98.2 F (36.8 C) (Oral)   Resp 14   Ht 5\' 3"  (1.6 m)   SpO2 98%  Nursing note and vital signs reviewed.  Physical Exam  Constitutional: She appears well-developed and well-nourished. No distress.  Cardiovascular: Normal rate, regular rhythm, normal heart sounds and intact distal pulses.   Pulmonary/Chest: Effort normal and breath sounds normal.  Neurological: She is alert.  Skin: Skin is warm and dry.  Rash appears resolved. Exam is otherwise normal.  Psychiatric: She has a normal mood and affect. Her behavior is normal. Judgment and thought content normal.       Assessment & Plan:   Problem List Items Addressed This Visit      Nervous and Auditory   Neuralgia, postherpetic    Symptoms and exam are consistent with postherpetic neuralgia. Continue lidocaine patches. Discussed importance of using lidocaine patches as prescribed. Decrease gabapentin to 100 mg 3 times daily as needed for neuropathic pain. No rashes noted. Continue to monitor and follow-up if symptoms worsen or do not improve.      Relevant Medications   gabapentin (NEURONTIN) 100 MG capsule       I have discontinued  Angela [name]'s gabapentin. I am also having her start on gabapentin. Additionally, I am having her maintain her loratadine, triamcinolone, lidocaine, valACYclovir, and polyethylene glycol powder.   Meds ordered this encounter  Medications  . gabapentin (NEURONTIN) 100 MG capsule    Sig: Take 1 capsule (100 mg total) by mouth 3 (three) times daily.    Dispense:  30 capsule    Refill:  0    Order Specific Question:   Supervising Provider    Answer:   Pricilla Holm A [[id number]]     Follow-up: Return if symptoms worsen or fail to improve.  Angela Po, FNP

## 2016-05-27 NOTE — Patient Instructions (Addendum)
Thank you for choosing [facility].  SUMMARY AND INSTRUCTIONS:  DECREASE GABAPENTIN TO 100 MG 3X PER DAY WITH BREAKFAST, LUNCH AND DINNER. (9:30 AM, 1:30 PM, 6:30 PM)   CONTINUE WITH THE LIDOCAINE PATCHES AS NEEDED.  CONSIDER SOLANPAS IF YOU NEED.   IF LOWER DOSAGE WORKS, PLEASE LET us KNOW AND WE WILL CALL IN ADDITIONAL MEDICATION.   Follow up:  If your symptoms worsen or fail to improve, please contact our office for further instruction, or in case of emergency go directly to the emergency room at the closest medical facility.

## 2016-05-27 NOTE — Assessment & Plan Note (Signed)
Symptoms and exam are consistent with postherpetic neuralgia. Continue lidocaine patches. Discussed importance of using lidocaine patches as prescribed. Decrease gabapentin to 100 mg 3 times daily as needed for neuropathic pain. No rashes noted. Continue to monitor and follow-up if symptoms worsen or do not improve.

## 2016-06-15 ENCOUNTER — Other Ambulatory Visit: Payer: Self-pay | Admitting: Family

## 2016-07-08 ENCOUNTER — Telehealth: Payer: Self-pay | Admitting: Family

## 2016-07-08 NOTE — Telephone Encounter (Signed)
Pt called stating that the lidocaine (LIDODERM) 5 % Patches are causing irration on her skin. She wanted to know if there was anything else that she could use.

## 2016-07-09 NOTE — Telephone Encounter (Signed)
If she is continuing to have issues please have her follow up with an office visit.

## 2016-07-13 NOTE — Telephone Encounter (Signed)
Wanted to know more about an alternative for the lidocaine patch. Is there an alternative med that can be used.

## 2016-07-13 NOTE — Telephone Encounter (Signed)
Called per Marya Amsler to figure out what she was using the patches for. Pt states she was using them bc she was told to but does not have current pain anywhere. I advised if patches are causing skin irritation then to stop using them. If she is to have pain after stopping patches and the she was advised to call and let us know for a possible alternative.

## 2016-07-15 NOTE — Telephone Encounter (Signed)
Pt has stopped taking the patches and she is having a little bit of pain    Monica from [facility] would like a call back in regard  [phone]

## 2016-07-16 NOTE — Telephone Encounter (Signed)
Spoke with pt and advised that she come in for an OV with Marya Amsler. She states she was having itching all over body and pins and needles feeling everywhere. Pt is to call back and schedule and appt next week.

## 2016-11-16 ENCOUNTER — Telehealth: Payer: Self-pay

## 2016-11-19 ENCOUNTER — Telehealth: Payer: Self-pay

## 2016-11-19 NOTE — Telephone Encounter (Signed)
Pt called and wanted to know who was taking [name]'s place. Informed pt that it was Caryl Pina and that she would need to make a transfer appt. Pt stated that she would call back.

## 2016-11-23 ENCOUNTER — Ambulatory Visit: Payer: Medicare Other | Admitting: Nurse Practitioner

## 2016-12-02 NOTE — Telephone Encounter (Signed)
Opened in error

## 2017-01-19 ENCOUNTER — Ambulatory Visit (INDEPENDENT_AMBULATORY_CARE_PROVIDER_SITE_OTHER): Payer: Medicare Other | Admitting: Internal Medicine

## 2017-01-19 ENCOUNTER — Encounter: Payer: Self-pay | Admitting: Internal Medicine

## 2017-01-19 ENCOUNTER — Other Ambulatory Visit (INDEPENDENT_AMBULATORY_CARE_PROVIDER_SITE_OTHER): Payer: Medicare Other

## 2017-01-19 VITALS — BP 114/72 | HR 71 | Temp 98.4°F | Ht 63.0 in

## 2017-01-19 DIAGNOSIS — R350 Frequency of micturition: Secondary | ICD-10-CM

## 2017-01-19 DIAGNOSIS — J309 Allergic rhinitis, unspecified: Secondary | ICD-10-CM

## 2017-01-19 DIAGNOSIS — B0229 Other postherpetic nervous system involvement: Secondary | ICD-10-CM

## 2017-01-19 DIAGNOSIS — E785 Hyperlipidemia, unspecified: Secondary | ICD-10-CM

## 2017-01-19 DIAGNOSIS — Z0001 Encounter for general adult medical examination with abnormal findings: Secondary | ICD-10-CM | POA: Diagnosis not present

## 2017-01-19 LAB — URINALYSIS, ROUTINE W REFLEX MICROSCOPIC
Bilirubin Urine: NEGATIVE
Hgb urine dipstick: NEGATIVE
Ketones, ur: NEGATIVE
LEUKOCYTES UA: NEGATIVE
Nitrite: NEGATIVE
RBC / HPF: NONE SEEN (ref 0–?)
SPECIFIC GRAVITY, URINE: 1.02 (ref 1.000–1.030)
TOTAL PROTEIN, URINE-UPE24: NEGATIVE
URINE GLUCOSE: NEGATIVE
UROBILINOGEN UA: 0.2 (ref 0.0–1.0)
pH: 7.5 (ref 5.0–8.0)

## 2017-01-19 LAB — LIPID PANEL
CHOL/HDL RATIO: 3
Cholesterol: 140 mg/dL (ref 0–200)
HDL: 52.4 mg/dL (ref >39.00)
LDL CALC: 74 mg/dL (ref 0–99)
NONHDL: 87.58
Triglycerides: 70 mg/dL (ref 0.0–149.0)
VLDL: 14 mg/dL (ref 0.0–40.0)

## 2017-01-19 LAB — BASIC METABOLIC PANEL
BUN: 22 mg/dL (ref 6–23)
CHLORIDE: 107 meq/L (ref 96–112)
CO2: 30 mEq/L (ref 19–32)
CREATININE: 0.72 mg/dL (ref 0.40–1.20)
Calcium: 9.2 mg/dL (ref 8.4–10.5)
GFR: 96.39 mL/min (ref >60.00)
GLUCOSE: 104 mg/dL — AB (ref 70–99)
POTASSIUM: 4 meq/L (ref 3.5–5.1)
Sodium: 143 mEq/L (ref 135–145)

## 2017-01-19 LAB — CBC WITH DIFFERENTIAL/PLATELET
BASOS ABS: 0 10*3/uL (ref 0.0–0.1)
Basophils Relative: 0.8 % (ref 0.0–3.0)
Eosinophils Absolute: 0 10*3/uL (ref 0.0–0.7)
Eosinophils Relative: 1 % (ref 0.0–5.0)
HEMATOCRIT: 34.2 % — AB (ref 36.0–46.0)
HEMOGLOBIN: 11.5 g/dL — AB (ref 12.0–15.0)
Lymphocytes Relative: 56.4 % — ABNORMAL HIGH (ref 12.0–46.0)
Lymphs Abs: 1.3 10*3/uL (ref 0.7–4.0)
MCHC: 33.5 g/dL (ref 30.0–36.0)
MCV: 96.1 fl (ref 78.0–100.0)
MONO ABS: 0.5 10*3/uL (ref 0.1–1.0)
MONOS PCT: 22 % — AB (ref 3.0–12.0)
Neutro Abs: 0.5 10*3/uL — ABNORMAL LOW (ref 1.4–7.7)
Neutrophils Relative %: 19.8 % — ABNORMAL LOW (ref 43.0–77.0)
RBC: 3.56 Mil/uL — ABNORMAL LOW (ref 3.87–5.11)
RDW: 17.8 % — ABNORMAL HIGH (ref 11.5–15.5)
WBC: 2.3 10*3/uL — ABNORMAL LOW (ref 4.0–10.5)

## 2017-01-19 LAB — HEPATIC FUNCTION PANEL
ALK PHOS: 96 U/L (ref 39–117)
ALT: 13 U/L (ref 0–35)
AST: 25 U/L (ref 0–37)
Albumin: 4 g/dL (ref 3.5–5.2)
BILIRUBIN DIRECT: 0.1 mg/dL (ref 0.0–0.3)
BILIRUBIN TOTAL: 0.5 mg/dL (ref 0.2–1.2)
TOTAL PROTEIN: 8.5 g/dL — AB (ref 6.0–8.3)

## 2017-01-19 LAB — TSH: TSH: 5.81 u[IU]/mL — AB (ref 0.35–4.50)

## 2017-01-19 MED ORDER — LORATADINE 10 MG PO TABS: 10.0000 mg | ORAL_TABLET | Freq: Every day | ORAL | 3 refills | Status: DC

## 2017-01-19 MED ORDER — TRIAMCINOLONE ACETONIDE 55 MCG/ACT NA AERO: 2.0000 | INHALATION_SPRAY | Freq: Every day | NASAL | 12 refills | Status: DC

## 2017-01-19 MED ORDER — NITROFURANTOIN MACROCRYSTAL 50 MG PO CAPS: 50.0000 mg | ORAL_CAPSULE | Freq: Two times a day (BID) | ORAL | 0 refills | Status: DC
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
## 2017-01-19 NOTE — Assessment & Plan Note (Signed)
Mild to mod, for claritin 10 qd prn restart,  to f/u any worsening symptoms or concerns

## 2017-01-19 NOTE — Assessment & Plan Note (Signed)

## 2017-01-19 NOTE — Assessment & Plan Note (Signed)
Probable UTI it seems, for UA and cx, ok for empiric antibx pending results

## 2017-01-19 NOTE — Patient Instructions (Addendum)
Please take the Loratidine 10 mg every day for allergies  We can try to send your Specimen to the lab for urine studies.  Please take all new medication as prescribed - the antibiotic  OK to stay off the gabapentin 300 mg, but continue the 100 mg gabapentin at three times per day  Please continue all other medications as before, and refills have been done if requested.  Please have the pharmacy call with any other refills you may need.  Please continue your efforts at being more active, low cholesterol diet, and weight control.  You are otherwise up to date with prevention measures today.  Please keep your appointments with your specialists as you may have planned  Please go to the LAB in the Basement (turn left off the elevator) for the tests to be done today, and try to do the urine specimen if you can, as we are not able to accept the container you brought.  You will be contacted by phone if any changes need to be made immediately.  Otherwise, you will receive a letter about your results with an explanation, but please check with MyChart first.  Please return in 6 months, or sooner if needed

## 2017-01-19 NOTE — Assessment & Plan Note (Signed)
Goal ldl < 100, for f/u lab today, cont lower chol diet

## 2017-01-19 NOTE — Assessment & Plan Note (Signed)
Overall good control with the 100 mg gabapentin dosing, to continue with refill

## 2017-01-20 LAB — URINE CULTURE
MICRO NUMBER:: 90066408
Result:: NO GROWTH
SPECIMEN QUALITY:: ADEQUATE

## 2017-04-28 ENCOUNTER — Ambulatory Visit (INDEPENDENT_AMBULATORY_CARE_PROVIDER_SITE_OTHER): Payer: Medicare Other | Admitting: Internal Medicine

## 2017-04-28 ENCOUNTER — Encounter: Payer: Self-pay | Admitting: Internal Medicine

## 2017-04-28 VITALS — BP 112/64 | HR 74 | Temp 98.1°F | Ht 63.0 in

## 2017-04-28 DIAGNOSIS — J309 Allergic rhinitis, unspecified: Secondary | ICD-10-CM | POA: Diagnosis not present

## 2017-04-28 DIAGNOSIS — H6123 Impacted cerumen, bilateral: Secondary | ICD-10-CM | POA: Diagnosis not present

## 2017-04-28 DIAGNOSIS — H9193 Unspecified hearing loss, bilateral: Secondary | ICD-10-CM

## 2017-04-28 DIAGNOSIS — R351 Nocturia: Secondary | ICD-10-CM | POA: Diagnosis not present

## 2017-04-28 DIAGNOSIS — L608 Other nail disorders: Secondary | ICD-10-CM | POA: Insufficient documentation

## 2017-04-28 MED ORDER — TRIAMCINOLONE ACETONIDE 55 MCG/ACT NA AERO: 2.0000 | INHALATION_SPRAY | Freq: Every day | NASAL | 12 refills | Status: DC

## 2017-04-28 MED ORDER — LORATADINE 10 MG PO TABS: 10.0000 mg | ORAL_TABLET | Freq: Every day | ORAL | 3 refills | Status: DC

## 2017-04-28 MED ORDER — OXYBUTYNIN CHLORIDE ER 5 MG PO TB24: 5.0000 mg | ORAL_TABLET | Freq: Every day | ORAL | 3 refills | Status: DC

## 2017-04-28 MED ORDER — METHYLPREDNISOLONE ACETATE 80 MG/ML IJ SUSP
80.0000 mg | Freq: Once | INTRAMUSCULAR | Status: AC
  Administered 2017-04-28: 80 mg via INTRAMUSCULAR

## 2017-04-28 NOTE — Assessment & Plan Note (Signed)
For podiatry referral 

## 2017-04-28 NOTE — Assessment & Plan Note (Addendum)
Mild to mod, for depomedrol IM 80, claritin and nasal steroid, to f/u any worsening symptoms or concerns, consider ENT for any recurrent nosebleed  Note:  Total time for pt hx, exam, review of record with pt in the room, determination of diagnoses and plan for further eval and tx is > 40 min, with over 50% spent in coordination and counseling of patient including the differential dx, tx, further evaluation and other management of allergic rhinitis, bilat ear hearing loss and wax impactions, toenail care, and urinary frequency with nocturia

## 2017-04-28 NOTE — Patient Instructions (Signed)
Please take all new medication as prescribed - the oxybutinin ER 5 mg to help with the bladder frequency and pain  Your ears were irrigated today and cleared of wax  You will be contacted regarding the referral for: Podiatry for the toenails  Please continue all other medications as before, and refills have been done for the claritin and nasal spray  Please have the pharmacy call with any other refills you may need.  Please keep your appointments with your specialists as you may have planned  Please return in 3 months, or sooner if needed

## 2017-04-28 NOTE — Assessment & Plan Note (Signed)
Resolved wax impactions and hearing some improved

## 2017-04-28 NOTE — Progress Notes (Signed)
   Subjective:     Patient ID: Angela [name], female    DOB: 1918-12-31, 84 y.o.   MRN: [id number]  HPI    Here as difficult historian today I think due mostly to hearing loss but also very mild cognitive slowing;  Has 1 wk of bilat hearing loss  - ? Wax again.  Has several painful toenails and asks for podiatry referral, can no longer do this herself.  Does have several wks ongoing nasal allergy symptoms with clearish congestion, itch and sneezing, without fever, pain, ST, cough, swelling or wheezing.  Had a right nosebleed earlier today but no worsening pain and declines ENT referral.  Does c/o ongoing fatigue, but denies signficant daytime hypersomnolence.  Denies urinary symptoms such as dysuria, flank pain, hematuria or n/v, fever, chills, but does have some frequency and urgency and nocturia 3 times per night. Past Medical History:  Diagnosis Date  . Adenocarcinoma of breast ([place])   . Allergic rhinitis   . Anemia   . Anxiety   . Depression   . Rheumatoid arthritis(714.0)    Past Surgical History:  Procedure Laterality Date  . ANKLE SURGERY    . CATARACT EXTRACTION    . masectomy     left breast    reports that she has quit smoking. She has never used smokeless tobacco. She reports that she does not drink alcohol or use drugs. family history includes Arthritis in her son; Cancer in her father and sister; Diabetes in her mother. Allergies  Allergen Reactions  . Penicillins     REACTION: Question of penicillin allergy   No current outpatient medications on file prior to visit.   No current facility-administered medications on file prior to visit.    Review of Systems  Constitutional: Negative for other unusual diaphoresis or sweats HENT: Negative for ear discharge or swelling Eyes: Negative for other worsening visual disturbances Respiratory: Negative for stridor or other swelling  Gastrointestinal: Negative for worsening distension or other blood Genitourinary: Negative  for retention or other urinary change Musculoskeletal: Negative for other MSK pain or swelling Skin: Negative for color change or other new lesions Neurological: Negative for worsening tremors and other numbness  Psychiatric/Behavioral: Negative for worsening agitation or other fatigue All other system neg per pt    Objective:   Physical Exam BP 112/64   Pulse 74   Temp 98.1 F (36.7 C) (Oral)   Ht 5\' 3"  (1.6 m)   SpO2 98%   BMI 21.26 kg/m  VS noted,  Constitutional: Pt appears in NAD HENT: Head: NCAT.  Right Ear: External ear normal.  Left Ear: External ear normal.  Eyes: . Pupils are equal, round, and reactive to light. Conjunctivae and EOM are normal Bilat ear wax impactions resolved with irrigation, hearing improved Nose: without d/c or deformity Bilat tm's with mild erythema.  Max sinus areas non tender.  Pharynx with mild erythema, no exudate, no current nosebleed Neck: Neck supple. Gross normal ROM Cardiovascular: Normal rate and regular rhythm.   Pulmonary/Chest: Effort normal and breath sounds without rales or wheezing.  Abd:  Soft, NT, ND, + BS Neurological: Pt is alert. At baseline orientation, motor grossly intact Skin: Skin is warm. No rashes, other new lesions, no LE edema Psychiatric: Pt behavior is normal without agitation  No other exam findings    Assessment & Plan:

## 2017-04-28 NOTE — Assessment & Plan Note (Signed)
Urine cx neg last visit, likely OAB related, for oxybutinin ER 5 qd

## 2017-06-29 ENCOUNTER — Other Ambulatory Visit: Payer: Self-pay | Admitting: *Deleted

## 2017-06-29 MED ORDER — FLUTICASONE PROPIONATE 50 MCG/ACT NA SUSP: NASAL | 3 refills | Status: DC

## 2017-07-12 ENCOUNTER — Telehealth: Payer: Self-pay

## 2017-07-12 DIAGNOSIS — R269 Unspecified abnormalities of gait and mobility: Secondary | ICD-10-CM

## 2017-07-12 NOTE — Telephone Encounter (Signed)
This requires a face to face meeting, and I will send pt to PT for scooter evaluation, all of which we are forced to do by insurance  Family will need to be in contact with a new scooter company and have forms ready at time of visit

## 2017-07-12 NOTE — Telephone Encounter (Signed)
Copied from [facility] [id number]. Topic: Inquiry >> Jul 12, 2017 10:33 AM Pricilla Handler wrote: Reason for CRM:  Dian Queen with [facility] called requesting if the patient could have a new scooter ordered by Dr. Jenny Reichmann. Everlena Cooper states that the patient is in need of a new scooter. Patient's battery is dead, and the company that provided the original scooter is no longer in business. Please call the patient at [phone]. Patient says when you call, please give her time to get to the phone. Don't hang up right away.        Thank You!!!

## 2017-07-12 NOTE — Addendum Note (Signed)
Addended by: [name] on: 07/12/2017 01:06 PM   Modules accepted: Orders

## 2017-07-13 NOTE — Telephone Encounter (Signed)
Scheduled patient.  Patient had a hard time understanding what to bring and no assistance or anyone I could call to explain.

## 2017-07-18 ENCOUNTER — Encounter: Payer: Self-pay | Admitting: Internal Medicine

## 2017-07-18 ENCOUNTER — Ambulatory Visit (INDEPENDENT_AMBULATORY_CARE_PROVIDER_SITE_OTHER): Payer: Medicare Other | Admitting: Internal Medicine

## 2017-07-18 VITALS — BP 112/68 | HR 73 | Temp 98.1°F | Ht 63.0 in

## 2017-07-18 DIAGNOSIS — J309 Allergic rhinitis, unspecified: Secondary | ICD-10-CM | POA: Diagnosis not present

## 2017-07-18 DIAGNOSIS — E785 Hyperlipidemia, unspecified: Secondary | ICD-10-CM

## 2017-07-18 DIAGNOSIS — R269 Unspecified abnormalities of gait and mobility: Secondary | ICD-10-CM

## 2017-07-18 DIAGNOSIS — M0609 Rheumatoid arthritis without rheumatoid factor, multiple sites: Secondary | ICD-10-CM

## 2017-07-18 DIAGNOSIS — N3281 Overactive bladder: Secondary | ICD-10-CM | POA: Diagnosis not present

## 2017-07-18 MED ORDER — OXYBUTYNIN CHLORIDE ER 5 MG PO TB24: 5.0000 mg | ORAL_TABLET | Freq: Every day | ORAL | 3 refills | Status: DC

## 2017-07-18 MED ORDER — FLUTICASONE PROPIONATE 50 MCG/ACT NA SUSP: NASAL | 3 refills | Status: DC

## 2017-07-18 NOTE — Addendum Note (Signed)
Addended by: [name] on: 07/18/2017 03:15 PM   Modules accepted: Orders

## 2017-07-18 NOTE — Assessment & Plan Note (Signed)
Ok to start the oxybutinin, not clear she is actually taking

## 2017-07-18 NOTE — Assessment & Plan Note (Signed)
stable overall by history and exam, recent data reviewed with pt, and pt to continue medical treatment as before,  to f/u any worsening symptoms or concerns Lab Results  Component Value Date   LDLCALC 74 01/19/2017

## 2017-07-18 NOTE — Patient Instructions (Addendum)
Please continue all other medications as before, and refills have been done if requested.  Please have the pharmacy call with any other refills you may need.  Please keep your appointments with your specialists as you may have planned  Please get in touch with [facility] about the forms for the scooter evaluation  You will be contacted regarding the referral for: Physical Therapy for scooter evaluation  Please return in 6 months, or sooner if needed

## 2017-07-18 NOTE — Progress Notes (Addendum)
Subjective:    Patient ID: Angela [name], female    DOB: January 06, 1918, 84 y.o.   MRN: [id number]  HPI  Here to f/u; overall doing ok,  Pt denies chest pain, increasing sob or doe, wheezing, orthopnea, PND, increased LE swelling, palpitations, dizziness or syncope.  Pt denies new neurological symptoms such as new headache, or facial or extremity weakness or numbness.  Pt denies polydipsia, polyuria, or low sugar episode.  Pt states overall good compliance with meds, mostly trying to follow appropriate diet, with wt overall stable,  but little exercise however.  Does have several wks ongoing nasal allergy symptoms with clearish congestion, itch and sneezing, without fever, pain, ST, cough, swelling or wheezing. Has bilat ear popping and crackling as well but no hearing loss.  RA doing ok without effusions or worsening pain.  Pt denies fever, wt loss, night sweats, loss of appetite, or other constitutional symptoms  She is mostly concerned about darkening of her skin overall for unclear reasons.  Living back in her own home b/c she just didn't like his home, and he sees here quite a bit and actually stays with her over night.  Was supposed to have scooter eval today but has no forms.   Past Medical History:  Diagnosis Date  . Adenocarcinoma of breast ([place])   . Allergic rhinitis   . Anemia   . Anxiety   . Depression   . Rheumatoid arthritis(714.0)    Past Surgical History:  Procedure Laterality Date  . ANKLE SURGERY    . CATARACT EXTRACTION    . masectomy     left breast    reports that she has quit smoking. She has never used smokeless tobacco. She reports that she does not drink alcohol or use drugs. family history includes Arthritis in her son; Cancer in her father and sister; Diabetes in her mother. Allergies  Allergen Reactions  . Penicillins     REACTION: Question of penicillin allergy   Current Outpatient Medications on File Prior to Visit  Medication Sig Dispense Refill  .  fluticasone (FLONASE) 50 MCG/ACT nasal spray SHAKE LIQUID AND USE 2 SPRAYS IN EACH NOSTRIL DAILY 16 g 3  . loratadine (CLARITIN) 10 MG tablet Take 1 tablet (10 mg total) by mouth daily. 90 tablet 3  . oxybutynin (DITROPAN-XL) 5 MG 24 hr tablet Take 1 tablet (5 mg total) by mouth at bedtime. 90 tablet 3   No current facility-administered medications on file prior to visit.    Review of Systems VS noted,  Constitutional: Pt appears in NAD HENT: Head: NCAT.  Right Ear: External ear normal.  Left Ear: External ear normal.  Eyes: . Pupils are equal, round, and reactive to light. Conjunctivae and EOM are normal Nose: without d/c or deformity Neck: Neck supple. Gross normal ROM Cardiovascular: Normal rate and regular rhythm.   Pulmonary/Chest: Effort normal and breath sounds without rales or wheezing.  Abd:  Soft, NT, ND, + BS, no organomegaly Neurological: Pt is alert. At baseline orientation, motor grossly intact Skin: Skin is warm. No rashes, other new lesions, no LE edema Psychiatric: Pt behavior is normal without agitation      Objective:   Physical Exam BP 112/68   Pulse 73   Temp 98.1 F (36.7 C) (Oral)   Ht 5\' 3"  (1.6 m)   SpO2 96%   BMI 21.26 kg/m  VS noted,  Constitutional: Pt appears in NAD HENT: Head: NCAT.  Right Ear: External ear normal.  Left  Ear: External ear normal.  Eyes: . Pupils are equal, round, and reactive to light. Conjunctivae and EOM are normal Nose: without d/c or deformity Neck: Neck supple. Gross normal ROM Cardiovascular: Normal rate and regular rhythm.   Pulmonary/Chest: Effort normal and breath sounds without rales or wheezing.  Abd:  Soft, NT, ND, + BS, no organomegaly Neurological: Pt is alert. At baseline orientation, motor grossly intact Skin: Skin is warm. No rashes, other new lesions, no LE edema Psychiatric: Pt behavior is normal without agitation  No other exam findings Lab Results  Component Value Date   WBC (L) 01/19/2017    2.3 A  Manual Differential was performed and is consistent with the Automated Differential.   HGB 11.5 (L) 01/19/2017   HCT 34.2 (L) 01/19/2017   PLT 366.0 Giant Platelets seen on smear. 01/19/2017   GLUCOSE 104 (H) 01/19/2017   CHOL 140 01/19/2017   TRIG 70.0 01/19/2017   HDL 52.40 01/19/2017   LDLCALC 74 01/19/2017   ALT 13 01/19/2017   AST 25 01/19/2017   NA 143 01/19/2017   K 4.0 01/19/2017   CL 107 01/19/2017   CREATININE 0.72 01/19/2017   BUN 22 01/19/2017   CO2 30 01/19/2017   TSH 5.81 (H) 01/19/2017          Assessment & Plan:

## 2017-07-18 NOTE — Assessment & Plan Note (Signed)
Ok for PT eval for scooter

## 2017-07-18 NOTE — Assessment & Plan Note (Signed)
stable overall by history and exam, and pt to continue medical treatment as before,  to f/u any worsening symptoms or concerns 

## 2017-07-28 ENCOUNTER — Ambulatory Visit: Payer: Medicare Other | Admitting: Internal Medicine

## 2017-07-28 ENCOUNTER — Ambulatory Visit: Payer: Medicare Other

## 2017-09-22 ENCOUNTER — Telehealth: Payer: Self-pay

## 2017-09-22 DIAGNOSIS — M069 Rheumatoid arthritis, unspecified: Secondary | ICD-10-CM

## 2017-09-22 DIAGNOSIS — C50919 Malignant neoplasm of unspecified site of unspecified female breast: Secondary | ICD-10-CM

## 2017-09-22 NOTE — Addendum Note (Signed)
Addended by: [name] on: 09/22/2017 03:04 PM   Modules accepted: Orders

## 2017-09-22 NOTE — Telephone Encounter (Signed)
Hardcopy order - to sh[facility]ron

## 2017-09-22 NOTE — Telephone Encounter (Signed)
Copied from [place] [id number]. Topic: Inquiry >> Sep 22, 2017 10:32 AM Scherrie Gerlach wrote:  Reason for CRM: pt called [facility] yesterday because her wheelchair is falling apart.  Screws are falling out, and it is hard to pull and does not roll easly. They advised pt is is eligible for a new chair, and have the dr send an order. Pt states this is concerning her manual chair, not the scooter. (although [facility] did tell her they do not do that type of work)  Pt wants to know what does she need to do to get a new wheelchair. Will talk about scooter later.  This is more important. Pt states this is an emergency. She is concerned chair is going to fall out on her. Please advise (did advise pt she may need appt, but she will will have to get with her son)

## 2017-09-22 NOTE — Telephone Encounter (Signed)
Faxed

## 2017-10-03 DIAGNOSIS — M1990 Unspecified osteoarthritis, unspecified site: Secondary | ICD-10-CM | POA: Diagnosis not present

## 2017-10-03 DIAGNOSIS — R5381 Other malaise: Secondary | ICD-10-CM | POA: Diagnosis not present

## 2017-10-03 DIAGNOSIS — M0549 Rheumatoid myopathy with rheumatoid arthritis of multiple sites: Secondary | ICD-10-CM | POA: Diagnosis not present

## 2017-10-03 DIAGNOSIS — M0609 Rheumatoid arthritis without rheumatoid factor, multiple sites: Secondary | ICD-10-CM | POA: Diagnosis not present

## 2017-10-03 DIAGNOSIS — R269 Unspecified abnormalities of gait and mobility: Secondary | ICD-10-CM | POA: Diagnosis not present

## 2017-11-02 DIAGNOSIS — R5381 Other malaise: Secondary | ICD-10-CM | POA: Diagnosis not present

## 2017-11-02 DIAGNOSIS — R269 Unspecified abnormalities of gait and mobility: Secondary | ICD-10-CM | POA: Diagnosis not present

## 2017-12-03 DIAGNOSIS — R269 Unspecified abnormalities of gait and mobility: Secondary | ICD-10-CM | POA: Diagnosis not present

## 2017-12-03 DIAGNOSIS — R5381 Other malaise: Secondary | ICD-10-CM | POA: Diagnosis not present

## 2018-01-02 DIAGNOSIS — R269 Unspecified abnormalities of gait and mobility: Secondary | ICD-10-CM | POA: Diagnosis not present

## 2018-01-02 DIAGNOSIS — R5381 Other malaise: Secondary | ICD-10-CM | POA: Diagnosis not present

## 2018-01-16 ENCOUNTER — Ambulatory Visit: Payer: Medicare Other | Admitting: Internal Medicine

## 2018-02-02 DIAGNOSIS — R269 Unspecified abnormalities of gait and mobility: Secondary | ICD-10-CM | POA: Diagnosis not present

## 2018-02-02 DIAGNOSIS — R5381 Other malaise: Secondary | ICD-10-CM | POA: Diagnosis not present

## 2018-03-04 DIAGNOSIS — R5381 Other malaise: Secondary | ICD-10-CM | POA: Diagnosis not present

## 2018-03-04 DIAGNOSIS — R269 Unspecified abnormalities of gait and mobility: Secondary | ICD-10-CM | POA: Diagnosis not present

## 2018-04-03 DIAGNOSIS — R269 Unspecified abnormalities of gait and mobility: Secondary | ICD-10-CM | POA: Diagnosis not present

## 2018-04-03 DIAGNOSIS — M1990 Unspecified osteoarthritis, unspecified site: Secondary | ICD-10-CM | POA: Diagnosis not present

## 2018-04-03 DIAGNOSIS — M069 Rheumatoid arthritis, unspecified: Secondary | ICD-10-CM | POA: Diagnosis not present

## 2018-04-03 DIAGNOSIS — M0549 Rheumatoid myopathy with rheumatoid arthritis of multiple sites: Secondary | ICD-10-CM | POA: Diagnosis not present

## 2018-04-19 ENCOUNTER — Telehealth: Payer: Self-pay

## 2018-04-19 NOTE — Telephone Encounter (Signed)
Noted  Copied from [place] [id number]. Topic: General - Other >> Apr 19, 2018  9:45 AM Valla Leaver wrote: Reason for CRM: Patient wants office to know she will not be doing any appointments until pandemic is over. >> Apr 19, 2018  9:51 AM Para Skeans A wrote: Juluis Rainier.

## 2018-05-04 DIAGNOSIS — M0549 Rheumatoid myopathy with rheumatoid arthritis of multiple sites: Secondary | ICD-10-CM | POA: Diagnosis not present

## 2018-05-04 DIAGNOSIS — R269 Unspecified abnormalities of gait and mobility: Secondary | ICD-10-CM | POA: Diagnosis not present

## 2018-05-04 DIAGNOSIS — M1990 Unspecified osteoarthritis, unspecified site: Secondary | ICD-10-CM | POA: Diagnosis not present

## 2018-05-04 DIAGNOSIS — M069 Rheumatoid arthritis, unspecified: Secondary | ICD-10-CM | POA: Diagnosis not present

## 2018-06-03 DIAGNOSIS — M1990 Unspecified osteoarthritis, unspecified site: Secondary | ICD-10-CM | POA: Diagnosis not present

## 2018-06-03 DIAGNOSIS — M0549 Rheumatoid myopathy with rheumatoid arthritis of multiple sites: Secondary | ICD-10-CM | POA: Diagnosis not present

## 2018-06-03 DIAGNOSIS — R269 Unspecified abnormalities of gait and mobility: Secondary | ICD-10-CM | POA: Diagnosis not present

## 2018-06-03 DIAGNOSIS — M069 Rheumatoid arthritis, unspecified: Secondary | ICD-10-CM | POA: Diagnosis not present

## 2018-07-04 DIAGNOSIS — M1990 Unspecified osteoarthritis, unspecified site: Secondary | ICD-10-CM | POA: Diagnosis not present

## 2018-07-04 DIAGNOSIS — M0549 Rheumatoid myopathy with rheumatoid arthritis of multiple sites: Secondary | ICD-10-CM | POA: Diagnosis not present

## 2018-07-04 DIAGNOSIS — R269 Unspecified abnormalities of gait and mobility: Secondary | ICD-10-CM | POA: Diagnosis not present

## 2018-07-04 DIAGNOSIS — M069 Rheumatoid arthritis, unspecified: Secondary | ICD-10-CM | POA: Diagnosis not present

## 2018-08-03 DIAGNOSIS — M1990 Unspecified osteoarthritis, unspecified site: Secondary | ICD-10-CM | POA: Diagnosis not present

## 2018-08-03 DIAGNOSIS — M069 Rheumatoid arthritis, unspecified: Secondary | ICD-10-CM | POA: Diagnosis not present

## 2018-08-03 DIAGNOSIS — M0549 Rheumatoid myopathy with rheumatoid arthritis of multiple sites: Secondary | ICD-10-CM | POA: Diagnosis not present

## 2018-08-03 DIAGNOSIS — R269 Unspecified abnormalities of gait and mobility: Secondary | ICD-10-CM | POA: Diagnosis not present

## 2018-08-11 ENCOUNTER — Other Ambulatory Visit: Payer: Self-pay | Admitting: Internal Medicine

## 2018-08-11 ENCOUNTER — Encounter: Payer: Self-pay | Admitting: Internal Medicine

## 2018-08-11 ENCOUNTER — Ambulatory Visit (INDEPENDENT_AMBULATORY_CARE_PROVIDER_SITE_OTHER): Payer: Medicare Other | Admitting: Internal Medicine

## 2018-08-11 ENCOUNTER — Other Ambulatory Visit (INDEPENDENT_AMBULATORY_CARE_PROVIDER_SITE_OTHER): Payer: Medicare Other

## 2018-08-11 ENCOUNTER — Other Ambulatory Visit: Payer: Self-pay

## 2018-08-11 VITALS — BP 124/82 | HR 74 | Temp 98.4°F | Ht 63.0 in

## 2018-08-11 DIAGNOSIS — D649 Anemia, unspecified: Secondary | ICD-10-CM

## 2018-08-11 DIAGNOSIS — Z0001 Encounter for general adult medical examination with abnormal findings: Secondary | ICD-10-CM

## 2018-08-11 DIAGNOSIS — J309 Allergic rhinitis, unspecified: Secondary | ICD-10-CM

## 2018-08-11 DIAGNOSIS — E538 Deficiency of other specified B group vitamins: Secondary | ICD-10-CM

## 2018-08-11 DIAGNOSIS — Z0000 Encounter for general adult medical examination without abnormal findings: Secondary | ICD-10-CM

## 2018-08-11 DIAGNOSIS — E611 Iron deficiency: Secondary | ICD-10-CM

## 2018-08-11 DIAGNOSIS — F32A Depression, unspecified: Secondary | ICD-10-CM

## 2018-08-11 DIAGNOSIS — E559 Vitamin D deficiency, unspecified: Secondary | ICD-10-CM | POA: Diagnosis not present

## 2018-08-11 DIAGNOSIS — F329 Major depressive disorder, single episode, unspecified: Secondary | ICD-10-CM

## 2018-08-11 LAB — CBC WITH DIFFERENTIAL/PLATELET
Basophils Absolute: 0.1 10*3/uL (ref 0.0–0.1)
Basophils Relative: 1.4 % (ref 0.0–3.0)
Eosinophils Absolute: 0.1 10*3/uL (ref 0.0–0.7)
Eosinophils Relative: 1.7 % (ref 0.0–5.0)
HCT: 32.8 % — ABNORMAL LOW (ref 36.0–46.0)
Hemoglobin: 10.8 g/dL — ABNORMAL LOW (ref 12.0–15.0)
Lymphocytes Relative: 25.4 % (ref 12.0–46.0)
Lymphs Abs: 1.1 10*3/uL (ref 0.7–4.0)
MCHC: 33 g/dL (ref 30.0–36.0)
MCV: 83.7 fl (ref 78.0–100.0)
Monocytes Absolute: 0.9 10*3/uL (ref 0.1–1.0)
Monocytes Relative: 20.5 % — ABNORMAL HIGH (ref 3.0–12.0)
Neutro Abs: 2.3 10*3/uL (ref 1.4–7.7)
Neutrophils Relative %: 51 % (ref 43.0–77.0)
Platelets: 945 10*3/uL — ABNORMAL HIGH (ref 150.0–400.0)
RBC: 3.91 Mil/uL (ref 3.87–5.11)
RDW: 21 % — ABNORMAL HIGH (ref 11.5–15.5)
WBC: 4.5 10*3/uL (ref 4.0–10.5)

## 2018-08-11 LAB — IBC PANEL
Iron: 9 ug/dL — ABNORMAL LOW (ref 42–145)
Saturation Ratios: 4.2 % — ABNORMAL LOW (ref 20.0–50.0)
Transferrin: 152 mg/dL — ABNORMAL LOW (ref 212.0–360.0)

## 2018-08-11 LAB — BASIC METABOLIC PANEL
BUN: 13 mg/dL (ref 6–23)
CO2: 25 mEq/L (ref 19–32)
Calcium: 8.9 mg/dL (ref 8.4–10.5)
Chloride: 106 mEq/L (ref 96–112)
Creatinine, Ser: 0.77 mg/dL (ref 0.40–1.20)
GFR: 83.66 mL/min (ref >60.00)
Glucose, Bld: 90 mg/dL (ref 70–99)
Potassium: 3.3 mEq/L — ABNORMAL LOW (ref 3.5–5.1)
Sodium: 140 mEq/L (ref 135–145)

## 2018-08-11 LAB — LIPID PANEL
Cholesterol: 98 mg/dL (ref 0–200)
HDL: 26.3 mg/dL — ABNORMAL LOW (ref >39.00)
LDL Cholesterol: 52 mg/dL (ref 0–99)
NonHDL: 72.17
Total CHOL/HDL Ratio: 4
Triglycerides: 102 mg/dL (ref 0.0–149.0)
VLDL: 20.4 mg/dL (ref 0.0–40.0)

## 2018-08-11 LAB — HEPATIC FUNCTION PANEL
ALT: 7 U/L (ref 0–35)
AST: 22 U/L (ref 0–37)
Albumin: 3.5 g/dL (ref 3.5–5.2)
Alkaline Phosphatase: 95 U/L (ref 39–117)
Bilirubin, Direct: 0.2 mg/dL (ref 0.0–0.3)
Total Bilirubin: 0.7 mg/dL (ref 0.2–1.2)
Total Protein: 7.5 g/dL (ref 6.0–8.3)

## 2018-08-11 LAB — VITAMIN B12: Vitamin B-12: 914 pg/mL — ABNORMAL HIGH (ref 211–911)

## 2018-08-11 LAB — TSH: TSH: 6.92 u[IU]/mL — ABNORMAL HIGH (ref 0.35–4.50)

## 2018-08-11 LAB — VITAMIN D 25 HYDROXY (VIT D DEFICIENCY, FRACTURES): VITD: 15.79 ng/mL — ABNORMAL LOW (ref 30.00–100.00)

## 2018-08-11 MED ORDER — TRIAMCINOLONE ACETONIDE 55 MCG/ACT NA AERO: 2.0000 | INHALATION_SPRAY | Freq: Every day | NASAL | 12 refills | Status: DC

## 2018-08-11 MED ORDER — CITALOPRAM HYDROBROMIDE 10 MG PO TABS: 10.0000 mg | ORAL_TABLET | Freq: Every day | ORAL | 3 refills | Status: DC

## 2018-08-11 MED ORDER — POTASSIUM CHLORIDE ER 10 MEQ PO TBCR: 10.0000 meq | EXTENDED_RELEASE_TABLET | Freq: Every day | ORAL | 0 refills | Status: DC

## 2018-08-11 MED ORDER — LEVOTHYROXINE SODIUM 25 MCG PO TABS: 25.0000 ug | ORAL_TABLET | Freq: Every day | ORAL | 3 refills | Status: DC

## 2018-08-11 MED ORDER — CETIRIZINE HCL 10 MG PO TABS: 10.0000 mg | ORAL_TABLET | Freq: Every day | ORAL | 11 refills | Status: DC

## 2018-08-11 MED ORDER — VITAMIN D (ERGOCALCIFEROL) 1.25 MG (50000 UNIT) PO CAPS: 50000.0000 [IU] | ORAL_CAPSULE | ORAL | 0 refills | Status: AC

## 2018-08-11 NOTE — Progress Notes (Signed)
Subjective:    Patient ID: Angela [name], female    DOB: [birth year], 85 y.o.   MRN: [id number]  HPI  Here for wellness and f/u;  Overall doing ok;  Pt denies Chest pain, worsening SOB, DOE, wheezing, orthopnea, PND, worsening LE edema, palpitations, dizziness or syncope.  Pt denies neurological change such as new headache, facial or extremity weakness.  Pt denies polydipsia, polyuria, or low sugar symptoms. Pt states overall good compliance with treatment and medications, good tolerability, and has been trying to follow appropriate diet.  No fever, night sweats, wt loss, loss of appetite, or other constitutional symptoms.  Pt states good ability with ADL's, has low fall risk, home safety reviewed and adequate, no other significant changes in hearing or vision, and only occasionally active with exercise. Has had mild worsening depressive symptoms for several months, but no suicidal ideation, or panic; has ongoing anxiety, now interfering with the sleep as well.   Also, Does have several wks ongoing nasal allergy symptoms with clearish congestion, itch and sneezing, without fever, pain, ST, cough, swelling or wheezing. Past Medical History:  Diagnosis Date  . Adenocarcinoma of breast ([place])   . Allergic rhinitis   . Anemia   . Anxiety   . Depression   . Rheumatoid arthritis(714.0)    Past Surgical History:  Procedure Laterality Date  . ANKLE SURGERY    . CATARACT EXTRACTION    . masectomy     left breast    reports that she has quit smoking. She has never used smokeless tobacco. She reports that she does not drink alcohol or use drugs. family history includes Arthritis in her son; Cancer in her father and sister; Diabetes in her mother. Allergies  Allergen Reactions  . Penicillins     REACTION: Question of penicillin allergy   Current Outpatient Medications on File Prior to Visit  Medication Sig Dispense Refill  . fluticasone (FLONASE) 50 MCG/ACT nasal spray SHAKE LIQUID AND USE 2  SPRAYS IN EACH NOSTRIL DAILY 16 g 3  . loratadine (CLARITIN) 10 MG tablet Take 1 tablet (10 mg total) by mouth daily. 90 tablet 3  . oxybutynin (DITROPAN-XL) 5 MG 24 hr tablet Take 1 tablet (5 mg total) by mouth at bedtime. 90 tablet 3   No current facility-administered medications on file prior to visit.    Review of Systems  Constitutional: Negative for other unusual diaphoresis or sweats HENT: Negative for ear discharge or swelling Eyes: Negative for other worsening visual disturbances Respiratory: Negative for stridor or other swelling  Gastrointestinal: Negative for worsening distension or other blood Genitourinary: Negative for retention or other urinary change Musculoskeletal: Negative for other MSK pain or swelling Skin: Negative for color change or other new lesions Neurological: Negative for worsening tremors and other numbness  Psychiatric/Behavioral: Negative for worsening agitation or other fatigue All other system neg per pt    Objective:   Physical Exam BP 124/82   Pulse 74   Temp 98.4 F (36.9 C) (Oral)   Ht 5\' 3"  (1.6 m)   SpO2 96%   BMI 21.26 kg/m  VS noted,  Constitutional: Pt appears in NAD HENT: Head: NCAT.  Right Ear: External ear normal.  Left Ear: External ear normal.  Bilat tm's with mild erythema.  Max sinus areas non tender.  Pharynx with mild erythema, no exudate Eyes: . Pupils are equal, round, and reactive to light. Conjunctivae and EOM are normal Nose: without d/c or deformity Neck: Neck supple. Gross normal ROM  Cardiovascular: Normal rate and regular rhythm.   Pulmonary/Chest: Effort normal and breath sounds without rales or wheezing.  Abd:  Soft, NT, ND, + BS, no organomegaly Neurological: Pt is alert. At baseline orientation, motor grossly intact Skin: Skin is warm. No rashes, other new lesions, no LE edema Psychiatric: Pt behavior is normal without agitation but with depressed affect and mood No other exam findings Lab Results   Component Value Date   WBC 4.5 08/11/2018   HGB 10.8 (L) 08/11/2018   HCT 32.8 (L) 08/11/2018   PLT 945.0 Repeated and verified X2. (H) 08/11/2018   GLUCOSE 90 08/11/2018   CHOL 98 08/11/2018   TRIG 102.0 08/11/2018   HDL 26.30 (L) 08/11/2018   LDLCALC 52 08/11/2018   ALT 7 08/11/2018   AST 22 08/11/2018   NA 140 08/11/2018   K 3.3 (L) 08/11/2018   CL 106 08/11/2018   CREATININE 0.77 08/11/2018   BUN 13 08/11/2018   CO2 25 08/11/2018   TSH 6.92 (H) 08/11/2018      Assessment & Plan:

## 2018-08-11 NOTE — Patient Instructions (Signed)
Please take all new medication as prescribed - the zyrtec and nasacort for allergies, and the citalopram 10 mg per day for depression  Please continue all other medications as before, and refills have been done if requested.  Please have the pharmacy call with any other refills you may need.  Please continue your efforts at being more active, low cholesterol diet, and weight control.  You are otherwise up to date with prevention measures today.  Please keep your appointments with your specialists as you may have planned  Please go to the LAB in the Basement (turn left off the elevator) for the tests to be done today  You will be contacted by phone if any changes need to be made immediately.  Otherwise, you will receive a letter about your results with an explanation, but please check with MyChart first.  Please remember to sign up for MyChart if you have not done so, as this will be important to you in the future with finding out test results, communicating by private email, and scheduling acute appointments online when needed.  Please return in 6 months, or sooner if needed

## 2018-08-12 ENCOUNTER — Encounter: Payer: Self-pay | Admitting: Internal Medicine

## 2018-08-12 NOTE — Assessment & Plan Note (Addendum)
Mild to mod, for celexa 10 qd,  to f/u any worsening symptoms or concerns, decline counseling referral  In addition to the time spent performing CPE, I spent an additional 25 minutes face to face,in which greater than 50% of this time was spent in counseling and coordination of care for patient's acute illness as documented, including the differential dx, treatment, further evaluation and other management of depression, anxiety, allergic rhinitis, anemia

## 2018-08-12 NOTE — Assessment & Plan Note (Signed)
stable overall by history and exam, recent data reviewed with pt, and pt to continue medical treatment as before,  to f/u any worsening symptoms or concerns  

## 2018-08-12 NOTE — Assessment & Plan Note (Signed)
Mild to mod, for ,  to f/u any worsening szyrtec, nasaocort asd,ymptoms or concerns

## 2018-08-12 NOTE — Assessment & Plan Note (Signed)

## 2019-01-25 ENCOUNTER — Other Ambulatory Visit: Payer: Self-pay

## 2019-01-25 ENCOUNTER — Encounter: Payer: Self-pay | Admitting: Internal Medicine

## 2019-01-25 ENCOUNTER — Ambulatory Visit (INDEPENDENT_AMBULATORY_CARE_PROVIDER_SITE_OTHER): Payer: Medicare Other | Admitting: Internal Medicine

## 2019-01-25 VITALS — BP 110/76 | HR 80 | Temp 98.2°F

## 2019-01-25 DIAGNOSIS — E876 Hypokalemia: Secondary | ICD-10-CM

## 2019-01-25 DIAGNOSIS — R300 Dysuria: Secondary | ICD-10-CM

## 2019-01-25 DIAGNOSIS — F411 Generalized anxiety disorder: Secondary | ICD-10-CM | POA: Diagnosis not present

## 2019-01-25 DIAGNOSIS — R7989 Other specified abnormal findings of blood chemistry: Secondary | ICD-10-CM

## 2019-01-25 DIAGNOSIS — J309 Allergic rhinitis, unspecified: Secondary | ICD-10-CM | POA: Diagnosis not present

## 2019-01-25 DIAGNOSIS — E559 Vitamin D deficiency, unspecified: Secondary | ICD-10-CM

## 2019-01-25 MED ORDER — FEXOFENADINE HCL 180 MG PO TABS: 180.0000 mg | ORAL_TABLET | Freq: Every day | ORAL | 3 refills | Status: DC

## 2019-01-25 MED ORDER — CIPROFLOXACIN HCL 500 MG PO TABS: 500.0000 mg | ORAL_TABLET | Freq: Two times a day (BID) | ORAL | 0 refills | Status: AC

## 2019-01-25 MED ORDER — CITALOPRAM HYDROBROMIDE 10 MG PO TABS: 10.0000 mg | ORAL_TABLET | Freq: Every day | ORAL | 3 refills | Status: AC

## 2019-01-25 NOTE — Patient Instructions (Signed)
Please take all new medication as prescribed - the antibiotic  Ok to change the zyrtec to allegra for allergies  Please continue all other medications as before, including the celexa 10 mg for nerves.  Please have the pharmacy call with any other refills you may need.  Please keep your appointments with your specialists as you may have planned

## 2019-01-25 NOTE — Progress Notes (Addendum)
Subjective:    Patient ID: Angela [name], female    DOB: 10/17/1918, 86 y.o.   MRN: [id number]  HPI  Here with family with c/o 2 days onset dyrusia, but unable to give specimen today and we cannot do I&O cath in office; Denies urinary symptoms such as frequency, urgency, flank pain, hematuria or n/v, fever, chills.  Does have several wks ongoing nasal allergy symptoms with clearish congestion, itch and sneezing, without fever, pain, ST, cough, swelling or wheezing.  Denies hyper or hypo thyroid symptoms such as voice, skin or hair change.  Denies worsening depressive symptoms, suicidal ideation, or panic; has ongoing anxiety, worsening recently.  Needs hosp bed -  Patient requires frequent repositioning of the body in ways that cannot be achieved in an ordinary bed to relieve pain or pressure.   Past Medical History:  Diagnosis Date  . Adenocarcinoma of breast ([place])   . Allergic rhinitis   . Anemia   . Anxiety   . Depression   . Rheumatoid arthritis(714.0)    Past Surgical History:  Procedure Laterality Date  . ANKLE SURGERY    . CATARACT EXTRACTION    . masectomy     left breast    reports that she has quit smoking. She has never used smokeless tobacco. She reports that she does not drink alcohol or use drugs. family history includes Arthritis in her son; Cancer in her father and sister; Diabetes in her mother. Allergies  Allergen Reactions  . Penicillins     REACTION: Question of penicillin allergy   Current Outpatient Medications on File Prior to Visit  Medication Sig Dispense Refill  . cetirizine (ZYRTEC) 10 MG tablet Take 1 tablet (10 mg total) by mouth daily. 30 tablet 11  . fluticasone (FLONASE) 50 MCG/ACT nasal spray SHAKE LIQUID AND USE 2 SPRAYS IN EACH NOSTRIL DAILY 16 g 3  . levothyroxine (SYNTHROID) 25 MCG tablet Take 1 tablet (25 mcg total) by mouth daily before breakfast. 90 tablet 3  . loratadine (CLARITIN) 10 MG tablet Take 1 tablet (10 mg total) by mouth daily.  90 tablet 3  . oxybutynin (DITROPAN-XL) 5 MG 24 hr tablet Take 1 tablet (5 mg total) by mouth at bedtime. 90 tablet 3  . potassium chloride (KLOR-CON 10) 10 MEQ tablet Take 1 tablet (10 mEq total) by mouth daily for 3 days. 3 tablet 0  . triamcinolone (NASACORT) 55 MCG/ACT AERO nasal inhaler Place 2 sprays into the nose daily. 1 Inhaler 12  . Vitamin D, Ergocalciferol, (DRISDOL) 1.25 MG (50000 UT) CAPS capsule Take 1 capsule (50,000 Units total) by mouth every 7 (seven) days. 12 capsule 0   No current facility-administered medications on file prior to visit.   Review of Systems All otherwise neg per pt     Objective:   Physical Exam BP 110/76   Pulse 80   Temp 98.2 F (36.8 C)   SpO2 93%  VS noted,  Constitutional: Pt appears in NAD HENT: Head: NCAT.  Right Ear: External ear normal.  Left Ear: External ear normal.  Eyes: . Pupils are equal, round, and reactive to light. Conjunctivae and EOM are normal Nose: without d/c or deformity Neck: Neck supple. Gross normal ROM Cardiovascular: Normal rate and regular rhythm.   Pulmonary/Chest: Effort normal and breath sounds without rales or wheezing.  Abd:  Soft, NT, ND, + BS, no organomegaly Neurological: Pt is alert. At baseline orientation, motor grossly intact Skin: Skin is warm. No rashes, other new lesions,  no LE edema Psychiatric: Pt behavior is normal without agitation  All otherwise neg per pt Lab Results  Component Value Date   WBC 4.5 08/11/2018   HGB 10.8 (L) 08/11/2018   HCT 32.8 (L) 08/11/2018   PLT 945.0 Repeated and verified X2. (H) 08/11/2018   GLUCOSE 90 08/11/2018   CHOL 98 08/11/2018   TRIG 102.0 08/11/2018   HDL 26.30 (L) 08/11/2018   LDLCALC 52 08/11/2018   ALT 7 08/11/2018   AST 22 08/11/2018   NA 140 08/11/2018   K 3.3 (L) 08/11/2018   CL 106 08/11/2018   CREATININE 0.77 08/11/2018   BUN 13 08/11/2018   CO2 25 08/11/2018   TSH 6.92 (H) 08/11/2018        Assessment & Plan:

## 2019-01-28 ENCOUNTER — Encounter: Payer: Self-pay | Admitting: Internal Medicine

## 2019-01-28 DIAGNOSIS — E559 Vitamin D deficiency, unspecified: Secondary | ICD-10-CM | POA: Insufficient documentation

## 2019-01-28 DIAGNOSIS — E876 Hypokalemia: Secondary | ICD-10-CM | POA: Insufficient documentation

## 2019-01-28 DIAGNOSIS — R7989 Other specified abnormal findings of blood chemistry: Secondary | ICD-10-CM | POA: Insufficient documentation

## 2019-01-28 NOTE — Assessment & Plan Note (Signed)
Declines lab f/u today

## 2019-01-28 NOTE — Assessment & Plan Note (Signed)
declnies lab f/u

## 2019-01-28 NOTE — Assessment & Plan Note (Signed)
Declines lab f/u 

## 2019-01-28 NOTE — Assessment & Plan Note (Signed)
Mild to mod, for celexa 10 qd,  to f/u any worsening symptoms or concerns 

## 2019-01-28 NOTE — Assessment & Plan Note (Addendum)
Mild to mod, suspect prob uti, unable for specimen, for antibx course,  to f/u any worsening symptoms or concerns  I spent 40 minutes preparing to see the patient by review of recent labs, imaging and procedures, obtaining and reviewing separately obtained history, communicating with the patient and family or caregiver, ordering medications, tests or procedures, and documenting clinical information in the EHR including the differential Dx, treatment, and any further evaluation and other management of dysuria, anxiety, alelrgy, abnormal tsh, low K, and low Vit D

## 2019-01-28 NOTE — Assessment & Plan Note (Signed)
For allegra daily prn , to f/u any worsening symptoms or concerns

## 2019-02-05 ENCOUNTER — Telehealth: Payer: Self-pay

## 2019-02-05 DIAGNOSIS — C50919 Malignant neoplasm of unspecified site of unspecified female breast: Secondary | ICD-10-CM

## 2019-02-05 DIAGNOSIS — M069 Rheumatoid arthritis, unspecified: Secondary | ICD-10-CM

## 2019-02-05 NOTE — Telephone Encounter (Signed)
Please advise...see below message.

## 2019-02-05 NOTE — Telephone Encounter (Signed)
Patient's daughter, Shammara Granderson, calling and states that she would like for her mother to get a hospital bed and the over the bed tray. States that the patient has a very low bed at this time and it is difficult for her to get in and out of. Please advise.  CB#: [phone]

## 2019-02-06 ENCOUNTER — Telehealth: Payer: Self-pay

## 2019-02-06 NOTE — Telephone Encounter (Signed)
Called and spoke with pt's daughter to inform her that Dr.[name] has given her mother rx for hospital bed.She said that she need to confirm with her brother on where the rx needs to be sent too, if could call her back in few minutes. I called pt's daughter back she didn't answer left her voicemail. Also informed that she didn't have dme company in place that we can send the rx to  [facility] for her.

## 2019-02-06 NOTE — Telephone Encounter (Signed)
   Please return call to daughter to discuss hospital bed, wants to use [phone]

## 2019-02-06 NOTE — Telephone Encounter (Signed)
Done hardcopy 

## 2019-02-06 NOTE — Telephone Encounter (Signed)
Called back and spoke with pt's daughter to inform her that we send rx for hospital bed and tray to [facility].

## 2019-02-08 NOTE — Telephone Encounter (Signed)
Dr. Jenny Reichmann is it ok to send order to [facility] bed?..Angela [name]

## 2019-02-09 NOTE — Telephone Encounter (Signed)
Completed and sent.

## 2019-02-09 NOTE — Telephone Encounter (Signed)
Order done hardcopy to Ms Angela [name] yesterday

## 2019-02-09 NOTE — Telephone Encounter (Signed)
Community message sent today to Erin Hearing and Anderson Malta with [facility].

## 2019-02-13 ENCOUNTER — Telehealth: Payer: Self-pay | Admitting: Internal Medicine

## 2019-02-13 NOTE — Telephone Encounter (Signed)
Patient was triaged with [facility].  Patient has swelling in her left foot and short of breath with exertion.  Daughter was advised to take patient to the ED.  Daughter has refused.   Please follow up with patient in regard.

## 2019-03-08 ENCOUNTER — Encounter (HOSPITAL_COMMUNITY): Payer: Self-pay | Admitting: Emergency Medicine

## 2019-03-08 ENCOUNTER — Other Ambulatory Visit: Payer: Self-pay

## 2019-03-08 ENCOUNTER — Emergency Department (HOSPITAL_COMMUNITY): Payer: Medicare Other

## 2019-03-08 ENCOUNTER — Inpatient Hospital Stay (HOSPITAL_COMMUNITY): Payer: Medicare Other

## 2019-03-08 ENCOUNTER — Inpatient Hospital Stay (HOSPITAL_COMMUNITY)
Admission: EM | Admit: 2019-03-08 | Discharge: 2019-03-14 | DRG: 193 | Disposition: A | Discharge status: Skilled Nursing Facility | Payer: Medicare Other | Attending: Internal Medicine | Admitting: Internal Medicine

## 2019-03-08 ENCOUNTER — Telehealth: Payer: Self-pay | Admitting: Internal Medicine

## 2019-03-08 DIAGNOSIS — Z880 Allergy status to penicillin: Secondary | ICD-10-CM | POA: Diagnosis not present

## 2019-03-08 DIAGNOSIS — Z853 Personal history of malignant neoplasm of breast: Secondary | ICD-10-CM

## 2019-03-08 DIAGNOSIS — I130 Hypertensive heart and chronic kidney disease with heart failure and stage 1 through stage 4 chronic kidney disease, or unspecified chronic kidney disease: Secondary | ICD-10-CM | POA: Diagnosis present

## 2019-03-08 DIAGNOSIS — R531 Weakness: Secondary | ICD-10-CM | POA: Diagnosis not present

## 2019-03-08 DIAGNOSIS — J189 Pneumonia, unspecified organism: Secondary | ICD-10-CM | POA: Diagnosis not present

## 2019-03-08 DIAGNOSIS — Z681 Body mass index (BMI) 19 or less, adult: Secondary | ICD-10-CM | POA: Diagnosis not present

## 2019-03-08 DIAGNOSIS — Z79890 Hormone replacement therapy (postmenopausal): Secondary | ICD-10-CM

## 2019-03-08 DIAGNOSIS — I509 Heart failure, unspecified: Secondary | ICD-10-CM

## 2019-03-08 DIAGNOSIS — M069 Rheumatoid arthritis, unspecified: Secondary | ICD-10-CM | POA: Diagnosis not present

## 2019-03-08 DIAGNOSIS — R0602 Shortness of breath: Secondary | ICD-10-CM | POA: Diagnosis not present

## 2019-03-08 DIAGNOSIS — M6281 Muscle weakness (generalized): Secondary | ICD-10-CM | POA: Diagnosis not present

## 2019-03-08 DIAGNOSIS — M542 Cervicalgia: Secondary | ICD-10-CM | POA: Diagnosis not present

## 2019-03-08 DIAGNOSIS — R64 Cachexia: Secondary | ICD-10-CM | POA: Diagnosis present

## 2019-03-08 DIAGNOSIS — Z79899 Other long term (current) drug therapy: Secondary | ICD-10-CM | POA: Diagnosis not present

## 2019-03-08 DIAGNOSIS — I361 Nonrheumatic tricuspid (valve) insufficiency: Secondary | ICD-10-CM | POA: Diagnosis not present

## 2019-03-08 DIAGNOSIS — I5031 Acute diastolic (congestive) heart failure: Secondary | ICD-10-CM | POA: Diagnosis not present

## 2019-03-08 DIAGNOSIS — N390 Urinary tract infection, site not specified: Secondary | ICD-10-CM | POA: Diagnosis not present

## 2019-03-08 DIAGNOSIS — E43 Unspecified severe protein-calorie malnutrition: Secondary | ICD-10-CM | POA: Diagnosis present

## 2019-03-08 DIAGNOSIS — M6289 Other specified disorders of muscle: Secondary | ICD-10-CM | POA: Diagnosis not present

## 2019-03-08 DIAGNOSIS — Z833 Family history of diabetes mellitus: Secondary | ICD-10-CM

## 2019-03-08 DIAGNOSIS — Z66 Do not resuscitate: Secondary | ICD-10-CM | POA: Diagnosis present

## 2019-03-08 DIAGNOSIS — L8990 Pressure ulcer of unspecified site, unspecified stage: Secondary | ICD-10-CM | POA: Insufficient documentation

## 2019-03-08 DIAGNOSIS — E870 Hyperosmolality and hypernatremia: Secondary | ICD-10-CM | POA: Diagnosis present

## 2019-03-08 DIAGNOSIS — E162 Hypoglycemia, unspecified: Secondary | ICD-10-CM | POA: Diagnosis not present

## 2019-03-08 DIAGNOSIS — Z809 Family history of malignant neoplasm, unspecified: Secondary | ICD-10-CM | POA: Diagnosis not present

## 2019-03-08 DIAGNOSIS — I471 Supraventricular tachycardia: Secondary | ICD-10-CM | POA: Diagnosis not present

## 2019-03-08 DIAGNOSIS — J9601 Acute respiratory failure with hypoxia: Secondary | ICD-10-CM | POA: Diagnosis not present

## 2019-03-08 DIAGNOSIS — M25519 Pain in unspecified shoulder: Secondary | ICD-10-CM

## 2019-03-08 DIAGNOSIS — E785 Hyperlipidemia, unspecified: Secondary | ICD-10-CM | POA: Diagnosis not present

## 2019-03-08 DIAGNOSIS — H9190 Unspecified hearing loss, unspecified ear: Secondary | ICD-10-CM | POA: Diagnosis not present

## 2019-03-08 DIAGNOSIS — Z7401 Bed confinement status: Secondary | ICD-10-CM | POA: Diagnosis not present

## 2019-03-08 DIAGNOSIS — N1832 Chronic kidney disease, stage 3b: Secondary | ICD-10-CM | POA: Diagnosis not present

## 2019-03-08 DIAGNOSIS — R296 Repeated falls: Secondary | ICD-10-CM | POA: Diagnosis not present

## 2019-03-08 DIAGNOSIS — R7989 Other specified abnormal findings of blood chemistry: Secondary | ICD-10-CM | POA: Diagnosis not present

## 2019-03-08 DIAGNOSIS — E039 Hypothyroidism, unspecified: Secondary | ICD-10-CM | POA: Diagnosis not present

## 2019-03-08 DIAGNOSIS — N179 Acute kidney failure, unspecified: Secondary | ICD-10-CM | POA: Diagnosis not present

## 2019-03-08 DIAGNOSIS — R0902 Hypoxemia: Secondary | ICD-10-CM | POA: Diagnosis not present

## 2019-03-08 DIAGNOSIS — Z20822 Contact with and (suspected) exposure to covid-19: Secondary | ICD-10-CM | POA: Diagnosis present

## 2019-03-08 DIAGNOSIS — D473 Essential (hemorrhagic) thrombocythemia: Secondary | ICD-10-CM | POA: Diagnosis not present

## 2019-03-08 DIAGNOSIS — M25511 Pain in right shoulder: Secondary | ICD-10-CM | POA: Diagnosis not present

## 2019-03-08 DIAGNOSIS — E876 Hypokalemia: Secondary | ICD-10-CM | POA: Diagnosis not present

## 2019-03-08 DIAGNOSIS — W19XXXA Unspecified fall, initial encounter: Secondary | ICD-10-CM | POA: Diagnosis not present

## 2019-03-08 DIAGNOSIS — D75839 Thrombocytosis, unspecified: Secondary | ICD-10-CM

## 2019-03-08 DIAGNOSIS — I350 Nonrheumatic aortic (valve) stenosis: Secondary | ICD-10-CM | POA: Diagnosis not present

## 2019-03-08 DIAGNOSIS — F419 Anxiety disorder, unspecified: Secondary | ICD-10-CM | POA: Diagnosis present

## 2019-03-08 DIAGNOSIS — R509 Fever, unspecified: Secondary | ICD-10-CM | POA: Diagnosis not present

## 2019-03-08 DIAGNOSIS — M0609 Rheumatoid arthritis without rheumatoid factor, multiple sites: Secondary | ICD-10-CM | POA: Diagnosis not present

## 2019-03-08 DIAGNOSIS — Z87891 Personal history of nicotine dependence: Secondary | ICD-10-CM | POA: Diagnosis not present

## 2019-03-08 DIAGNOSIS — R627 Adult failure to thrive: Secondary | ICD-10-CM | POA: Diagnosis not present

## 2019-03-08 DIAGNOSIS — M2550 Pain in unspecified joint: Secondary | ICD-10-CM | POA: Diagnosis not present

## 2019-03-08 DIAGNOSIS — J9690 Respiratory failure, unspecified, unspecified whether with hypoxia or hypercapnia: Secondary | ICD-10-CM | POA: Diagnosis not present

## 2019-03-08 DIAGNOSIS — R410 Disorientation, unspecified: Secondary | ICD-10-CM | POA: Diagnosis not present

## 2019-03-08 DIAGNOSIS — N3090 Cystitis, unspecified without hematuria: Secondary | ICD-10-CM

## 2019-03-08 DIAGNOSIS — I10 Essential (primary) hypertension: Secondary | ICD-10-CM | POA: Diagnosis not present

## 2019-03-08 DIAGNOSIS — F329 Major depressive disorder, single episode, unspecified: Secondary | ICD-10-CM | POA: Diagnosis present

## 2019-03-08 DIAGNOSIS — Z9010 Acquired absence of unspecified breast and nipple: Secondary | ICD-10-CM

## 2019-03-08 LAB — PROCALCITONIN: Procalcitonin: 2.2 ng/mL

## 2019-03-08 LAB — CBC WITH DIFFERENTIAL/PLATELET
Abs Immature Granulocytes: 0.08 10*3/uL — ABNORMAL HIGH (ref 0.00–0.07)
Basophils Absolute: 0 10*3/uL (ref 0.0–0.1)
Basophils Relative: 1 %
Eosinophils Absolute: 0 10*3/uL (ref 0.0–0.5)
Eosinophils Relative: 0 %
HCT: 46.5 % — ABNORMAL HIGH (ref 36.0–46.0)
Hemoglobin: 13.8 g/dL (ref 12.0–15.0)
Immature Granulocytes: 1 %
Lymphocytes Relative: 6 %
Lymphs Abs: 0.4 10*3/uL — ABNORMAL LOW (ref 0.7–4.0)
MCH: 26.7 pg (ref 26.0–34.0)
MCHC: 29.7 g/dL — ABNORMAL LOW (ref 30.0–36.0)
MCV: 89.9 fL (ref 80.0–100.0)
Monocytes Absolute: 1.1 10*3/uL — ABNORMAL HIGH (ref 0.1–1.0)
Monocytes Relative: 20 %
Neutro Abs: 4 10*3/uL (ref 1.7–7.7)
Neutrophils Relative %: 72 %
Platelets: 899 10*3/uL — ABNORMAL HIGH (ref 150–400)
RBC: 5.17 MIL/uL — ABNORMAL HIGH (ref 3.87–5.11)
RDW: 21.5 % — ABNORMAL HIGH (ref 11.5–15.5)
WBC: 5.6 10*3/uL (ref 4.0–10.5)
nRBC: 0.4 % — ABNORMAL HIGH (ref 0.0–0.2)

## 2019-03-08 LAB — URINALYSIS, ROUTINE W REFLEX MICROSCOPIC
Bilirubin Urine: NEGATIVE
Glucose, UA: NEGATIVE mg/dL
Ketones, ur: NEGATIVE mg/dL
Leukocytes,Ua: NEGATIVE
Nitrite: NEGATIVE
Protein, ur: >=300 mg/dL — AB
Specific Gravity, Urine: 1.016 (ref 1.005–1.030)
pH: 6 (ref 5.0–8.0)

## 2019-03-08 LAB — LACTIC ACID, PLASMA: Lactic Acid, Venous: 2.1 mmol/L (ref 0.5–1.9)

## 2019-03-08 LAB — APTT: aPTT: 47 seconds — ABNORMAL HIGH (ref 24–36)

## 2019-03-08 LAB — RESPIRATORY PANEL BY RT PCR (FLU A&B, COVID)
Influenza A by PCR: NEGATIVE
Influenza B by PCR: NEGATIVE
SARS Coronavirus 2 by RT PCR: NEGATIVE

## 2019-03-08 LAB — COMPREHENSIVE METABOLIC PANEL
ALT: 17 U/L (ref 0–44)
AST: 42 U/L — ABNORMAL HIGH (ref 15–41)
Albumin: 2.7 g/dL — ABNORMAL LOW (ref 3.5–5.0)
Alkaline Phosphatase: 78 U/L (ref 38–126)
Anion gap: 10 (ref 5–15)
BUN: 15 mg/dL (ref 8–23)
CO2: 24 mmol/L (ref 22–32)
Calcium: 8.3 mg/dL — ABNORMAL LOW (ref 8.9–10.3)
Chloride: 110 mmol/L (ref 98–111)
Creatinine, Ser: 0.98 mg/dL (ref 0.44–1.00)
GFR calc Af Amer: 55 mL/min — ABNORMAL LOW (ref >60)
GFR calc non Af Amer: 48 mL/min — ABNORMAL LOW (ref >60)
Glucose, Bld: 95 mg/dL (ref 70–99)
Potassium: 3.7 mmol/L (ref 3.5–5.1)
Sodium: 144 mmol/L (ref 135–145)
Total Bilirubin: 1.3 mg/dL — ABNORMAL HIGH (ref 0.3–1.2)
Total Protein: 6.8 g/dL (ref 6.5–8.1)

## 2019-03-08 LAB — C-REACTIVE PROTEIN: CRP: 15.4 mg/dL — ABNORMAL HIGH (ref <1.0)

## 2019-03-08 LAB — BRAIN NATRIURETIC PEPTIDE: B Natriuretic Peptide: 1203.1 pg/mL — ABNORMAL HIGH (ref 0.0–100.0)

## 2019-03-08 LAB — PROTIME-INR
INR: 1.4 — ABNORMAL HIGH (ref 0.8–1.2)
Prothrombin Time: 17.2 seconds — ABNORMAL HIGH (ref 11.4–15.2)

## 2019-03-08 LAB — D-DIMER, QUANTITATIVE: D-Dimer, Quant: 2.91 ug/mL-FEU — ABNORMAL HIGH (ref 0.00–0.50)

## 2019-03-08 LAB — POC SARS CORONAVIRUS 2 AG -  ED: SARS Coronavirus 2 Ag: NEGATIVE

## 2019-03-08 LAB — ABO/RH: ABO/RH(D): O POS

## 2019-03-08 LAB — TSH: TSH: 4.063 u[IU]/mL (ref 0.350–4.500)

## 2019-03-08 MED ORDER — SODIUM CHLORIDE 0.9 % IV SOLN
500.0000 mg | Freq: Once | INTRAVENOUS | Status: AC
  Administered 2019-03-08: 500 mg via INTRAVENOUS
  Filled 2019-03-08: qty 500

## 2019-03-08 MED ORDER — SODIUM CHLORIDE 0.9 % IV SOLN
1.0000 g | INTRAVENOUS | Status: DC
  Administered 2019-03-08 – 2019-03-14 (×7): 1 g via INTRAVENOUS
  Filled 2019-03-08 (×2): qty 1
  Filled 2019-03-08: qty 10
  Filled 2019-03-08 (×2): qty 1
  Filled 2019-03-08: qty 10
  Filled 2019-03-08 (×2): qty 1

## 2019-03-08 MED ORDER — LORATADINE 10 MG PO TABS
10.0000 mg | ORAL_TABLET | Freq: Every day | ORAL | Status: DC
  Administered 2019-03-09 – 2019-03-14 (×6): 10 mg via ORAL
  Filled 2019-03-08 (×6): qty 1

## 2019-03-08 MED ORDER — ENOXAPARIN SODIUM 40 MG/0.4ML ~~LOC~~ SOLN
40.0000 mg | SUBCUTANEOUS | Status: DC
  Administered 2019-03-08: 40 mg via SUBCUTANEOUS
  Filled 2019-03-08: qty 0.4

## 2019-03-08 MED ORDER — LEVOTHYROXINE SODIUM 25 MCG PO TABS
25.0000 ug | ORAL_TABLET | Freq: Every day | ORAL | Status: DC
  Administered 2019-03-09 – 2019-03-14 (×6): 25 ug via ORAL
  Filled 2019-03-08 (×6): qty 1

## 2019-03-08 MED ORDER — OXYBUTYNIN CHLORIDE ER 5 MG PO TB24
5.0000 mg | ORAL_TABLET | Freq: Every day | ORAL | Status: DC
  Administered 2019-03-09 – 2019-03-13 (×5): 5 mg via ORAL
  Filled 2019-03-08 (×5): qty 1

## 2019-03-08 MED ORDER — CITALOPRAM HYDROBROMIDE 10 MG PO TABS
10.0000 mg | ORAL_TABLET | Freq: Every day | ORAL | Status: DC
  Administered 2019-03-09 – 2019-03-14 (×6): 10 mg via ORAL
  Filled 2019-03-08 (×6): qty 1

## 2019-03-08 MED ORDER — ONDANSETRON HCL 4 MG PO TABS: 4.0000 mg | ORAL_TABLET | Freq: Four times a day (QID) | ORAL | Status: DC | PRN

## 2019-03-08 MED ORDER — ACETAMINOPHEN 325 MG PO TABS
650.0000 mg | ORAL_TABLET | Freq: Four times a day (QID) | ORAL | Status: DC | PRN
  Administered 2019-03-09 – 2019-03-11 (×3): 650 mg via ORAL
  Filled 2019-03-08 (×3): qty 2

## 2019-03-08 MED ORDER — ONDANSETRON HCL 4 MG/2ML IJ SOLN: 4.0000 mg | Freq: Four times a day (QID) | INTRAMUSCULAR | Status: DC | PRN

## 2019-03-08 NOTE — ED Notes (Signed)
Pt noted to have c-collar still in place. Spoke with Dr Alcario Drought and Dr Kathrynn Humble, who will be putting in orders r/t ct.

## 2019-03-08 NOTE — Telephone Encounter (Signed)
New message:   Pt's son is calling and states that he feels his mother needs to be sent to a long term healthcare facility. He states he is unable to get the patient up out of the bed and is unsure on what to do. Please advise.

## 2019-03-08 NOTE — Telephone Encounter (Signed)
Unable to reach son.  Left message for him to return back to clinic. Left me direct number for him to call me back.

## 2019-03-08 NOTE — ED Notes (Signed)
Date and time results received: 03/08/19 7:01 PM  (use smartphrase ".now" to insert current time)  Test: Lactic Critical Value: 2.1  Name of Provider Notified: Kathrynn Humble  Orders Received? Or Actions Taken?: Orders Received - See Orders for details

## 2019-03-08 NOTE — Progress Notes (Signed)
Through secure chat, bedside RN [name] stated that she spoke with Angela [name] about repeat lactic acid, and [name] said no need to redraw LA.

## 2019-03-08 NOTE — Telephone Encounter (Signed)
Spoke with patient's son today and info given.

## 2019-03-08 NOTE — H&P (Signed)
History and Physical    Angela [name] [id number] DOB: 1918/08/21 DOA: 03/08/2019  PCP: [name], MD  Patient coming from: Home  I have personally briefly reviewed patient's old medical records in [place]  Chief Complaint: Generalized weakness  HPI: Angela [name] is a 86 y.o. female with medical history significant of RA, advanced age.  Pt presents to the ED with generalized weakness, inability to get out of bed or transfer to wheelchair for past 2 days.  Report of strong smelling urine.  Pt oriented to self and location, feeling generally weak.  C/o neck and R arm pain but also pain all over.  Denies CP, cough, SOB, abd pain.   ED Course: UA with 21-50 WBC, many bacteria, neg LE and nitrites though.  CXR with diffuse opacity: multifocal PNA vs CHF.  Tm 100.2.  WBC nl.  Started on empiric rocephin / azithro for possible CAP / UTI coverage.  COVID pending.   Review of Systems: As per HPI, otherwise all review of systems negative.  Past Medical History:  Diagnosis Date  . Adenocarcinoma of breast ([place])   . Allergic rhinitis   . Anemia   . Anxiety   . Depression   . Rheumatoid arthritis(714.0)     Past Surgical History:  Procedure Laterality Date  . ANKLE SURGERY    . CATARACT EXTRACTION    . masectomy     left breast     reports that she has quit smoking. She has never used smokeless tobacco. She reports that she does not drink alcohol or use drugs.  Allergies  Allergen Reactions  . Penicillins     REACTION: Question of penicillin allergy    Family History  Problem Relation Age of Onset  . Diabetes Mother   . Cancer Father   . Cancer Sister   . Arthritis Son      Prior to Admission medications   Medication Sig Start Date End Date Taking? Authorizing Provider  cetirizine (ZYRTEC) 10 MG tablet Take 1 tablet (10 mg total) by mouth daily. 08/11/18 08/11/19  [name], MD  citalopram (CELEXA) 10 MG tablet Take 1 tablet (10 mg  total) by mouth daily. 01/25/19 01/25/20  [name], MD  fexofenadine (ALLEGRA) 180 MG tablet Take 1 tablet (180 mg total) by mouth daily. 01/25/19 01/25/20  [name], MD  fluticasone [facility]) 50 MCG/ACT nasal spray SHAKE LIQUID AND USE 2 SPRAYS IN [facility] NOSTRIL DAILY 07/18/17   [name], MD  levothyroxine (SYNTHROID) 25 MCG tablet Take 1 tablet (25 mcg total) by mouth daily before breakfast. 08/11/18   [name], MD  oxybutynin (DITROPAN-XL) 5 MG 24 hr tablet Take 1 tablet (5 mg total) by mouth at bedtime. 07/18/17   [name], MD  potassium chloride (KLOR-CON 10) 10 MEQ tablet Take 1 tablet (10 mEq total) by mouth daily for 3 days. 08/11/18 08/14/18  [name], MD  triamcinolone (NASACORT) 55 MCG/ACT AERO nasal inhaler Place 2 sprays into the nose daily. 08/11/18   [name], MD  Vitamin D, Ergocalciferol, (DRISDOL) 1.25 MG (50000 UT) CAPS capsule Take 1 capsule (50,000 Units total) by mouth every 7 (seven) days. 08/11/18   [name], MD    Physical Exam: Vitals:   03/08/19 1831 03/08/19 1845 03/08/19 1900 03/08/19 1907  BP: (!) 158/77  140/85   Pulse: (!) 102 89  76  Resp: (!) 26 (!) 27 (!) 22 (!) 26  Temp:      TempSrc:      SpO2: 100% 92%  100%    Constitutional: NAD, calm, comfortable Eyes: PERRL, lids and conjunctivae normal ENMT: Hard of hearing. Neck: normal, supple, no masses, no thyromegaly Respiratory: clear to auscultation bilaterally, no wheezing, no crackles. Normal respiratory effort. No accessory muscle use.  Cardiovascular: Regular rate and rhythm, no murmurs / rubs / gallops. No extremity edema. 2+ pedal pulses. No carotid bruits.  Abdomen: no tenderness, no masses palpated. No hepatosplenomegaly. Bowel sounds positive.  Musculoskeletal: no clubbing / cyanosis. No joint deformity upper and lower extremities. Good ROM, no contractures. Normal muscle tone.  Skin: no rashes, lesions, ulcers. No induration Neurologic: CN 2-12 grossly intact. Sensation  intact, DTR normal. Strength 5/5 in all 4.  Psychiatric: Normal judgment and insight. Alert and oriented x 3. Normal mood.    Labs on Admission: I have personally reviewed following labs and imaging studies  CBC: Recent Labs  Lab 03/08/19 1750  WBC 5.6  NEUTROABS 4.0  HGB 13.8  HCT 46.5*  MCV 89.9  PLT [id number]*   Basic Metabolic Panel: Recent Labs  Lab 03/08/19 1750  NA 144  K 3.7  CL 110  CO2 24  GLUCOSE 95  BUN 15  CREATININE 0.98  CALCIUM 8.3*   GFR: CrCl cannot be calculated (Unknown ideal weight.). Liver Function Tests: Recent Labs  Lab 03/08/19 1750  AST 42*  ALT 17  ALKPHOS 78  BILITOT 1.3*  PROT 6.8  ALBUMIN 2.7*   No results for input(s): LIPASE, AMYLASE in the last 168 hours. No results for input(s): AMMONIA in the last 168 hours. Coagulation Profile: Recent Labs  Lab 03/08/19 1810  INR 1.4*   Cardiac Enzymes: No results for input(s): CKTOTAL, CKMB, CKMBINDEX, TROPONINI in the last 168 hours. BNP (last 3 results) No results for input(s): PROBNP in the last 8760 hours. HbA1C: No results for input(s): HGBA1C in the last 72 hours. CBG: No results for input(s): GLUCAP in the last 168 hours. Lipid Profile: No results for input(s): CHOL, HDL, LDLCALC, TRIG, CHOLHDL, LDLDIRECT in the last 72 hours. Thyroid Function Tests: No results for input(s): TSH, T4TOTAL, FREET4, T3FREE, THYROIDAB in the last 72 hours. Anemia Panel: No results for input(s): VITAMINB12, FOLATE, FERRITIN, TIBC, IRON, RETICCTPCT in the last 72 hours. Urine analysis:    Component Value Date/Time   COLORURINE YELLOW 03/08/2019 1724   APPEARANCEUR HAZY (A) 03/08/2019 1724   LABSPEC 1.016 03/08/2019 1724   PHURINE 6.0 03/08/2019 1724   GLUCOSEU NEGATIVE 03/08/2019 1724   GLUCOSEU NEGATIVE 01/19/2017 1346   HGBUR SMALL (A) 03/08/2019 1724   BILIRUBINUR NEGATIVE 03/08/2019 1724   BILIRUBINUR neg 03/09/2013 1447   KETONESUR NEGATIVE 03/08/2019 1724   PROTEINUR >=300 (A)  03/08/2019 1724   UROBILINOGEN 0.2 01/19/2017 1346   NITRITE NEGATIVE 03/08/2019 1724   LEUKOCYTESUR NEGATIVE 03/08/2019 1724    Radiological Exams on Admission: DG Chest Port 1 View  Result Date: 03/08/2019 CLINICAL DATA:  Fall, fever EXAM: PORTABLE CHEST 1 VIEW COMPARISON:  Radiograph August 30, 2013 FINDINGS: Diffuse hazy interstitial opacities throughout both lungs with more patchy opacity in the right lung base and a small right pleural effusion. No pneumothorax. Cardiac silhouette is borderline enlarged. The aorta is calcified and tortuous. The osseous structures appear diffusely demineralized which may limit detection of small or nondisplaced fractures. Degenerative changes are present in the imaged spine and shoulders. Stable dextrocurvature of the thoracic spine. Postsurgical changes in the left axilla. IMPRESSION: Diffuse  hazy interstitial opacities throughout both lungs with more patchy opacity in the right lung base and a small right pleural effusion. Findings may represent pulmonary edema and/or multifocal pneumonia. Electronically Signed   By: Lovena Le M.D.   On: 03/08/2019 18:19    EKG: Independently reviewed.  Assessment/Plan Principal Problem:   Multifocal pneumonia Active Problems:   Rheumatoid arthritis ([place])   Generalized weakness   Acute lower UTI    1. Multifocal PNA vs CHF - 1. Tm 100.2 concerning for infection 2. COVID pending 3. COVID pathway 4. Empiric rocephin / azithro for the moment 5. Daily labs 6. No O2 requirement currently 7. Start remdesivir if COVID is + 8. BNP pending 9. BCx pending 2. Pyuria, ? UTI - 1. Rocephin empirically as above 2. UCx pending 3. Neck and R arm pain - 1. CT head and neck pending 2. DG R shoulder pending 4. Hypothyroidism - 1. Cont synthroid 2. Check TSH  DVT prophylaxis: Lovenox Code Status: Full code for now per Son Family Communication: Spoke with Son on phone Disposition Plan: TBD Consults called:  None Admission status: Admit to inpatient  Severity of Illness: The appropriate patient status for this patient is INPATIENT. Inpatient status is judged to be reasonable and necessary in order to provide the required intensity of service to ensure the patient's safety. The patient's presenting symptoms, physical exam findings, and initial radiographic and laboratory data in the context of their chronic comorbidities is felt to place them at high risk for further clinical deterioration. Furthermore, it is not anticipated that the patient will be medically stable for discharge from the hospital within 2 midnights of admission. The following factors support the patient status of inpatient.   IP status for treatment of fever, generalized weakness, new inability to perform ADLs in a 86 year old.  Suspected PNA in a 86 year old.   * I certify that at the point of admission it is my clinical judgment that the patient will require inpatient hospital care spanning beyond 2 midnights from the point of admission due to high intensity of service, high risk for further deterioration and high frequency of surveillance required.*    Noemi Ishmael M. DO [facility]  How to contact the [facility] Attending or Consulting provider [place] or covering provider during after hours [place], for this patient?  1. Check the care team in [facility] and look for a) attending/consulting TR[facility] provider listed and b) the [facility] team listed 2. Log into [email or web address]  Amion Physician Scheduling and messaging for groups and whole hospitals  On call and physician scheduling software for group practices, residents, hospitalists and other medical providers for call, clinic, rotation and shift schedules. OnCall Enterprise is a hospital-wide system for scheduling doctors and paging doctors on call. EasyPlot is for scientific plotting and data analysis.  [email or web address]  and use [place]'s universal password to access. If you do not have the  password, please contact the hospital operator.  3. Locate the [facility] provider you are looking for under [facility] and page to a number that you can be directly reached. 4. If you still have difficulty reaching the provider, please page the [facility] (Director on Call) for the Hospitalists listed on amion for assistance.  03/08/2019, 8:53 PM

## 2019-03-08 NOTE — ED Provider Notes (Signed)
[facility] DEPT Provider Note   CSN: [id number] Arrival date & time: 03/08/19  1635     History Chief Complaint  Patient presents with  . Fall  . Code Sepsis    Angela [name] is a 86 y.o. female.  HPI    86 year old comes in a chief complaint of fall. Patient has history of rheumatoid arthritis.  She resides at her home.  It appears that family called EMS because patient is getting weak.  Patient is oriented to self and location.  She admits that she is feeling weak and states that she lives with her daughter.  She denies any chest pain, shortness of breath, cough, UTI-like symptoms or abdominal pain.  She states that she has pain all over her body but that is not new.  I have called patient's emergency contact phone and also home phone, nobody is picking up.  Past Medical History:  Diagnosis Date  . Adenocarcinoma of breast ([place])   . Allergic rhinitis   . Anemia   . Anxiety   . Depression   . Rheumatoid arthritis(714.0)     Patient Active Problem List   Diagnosis Date Noted  . Abnormal TSH 01/28/2019  . Hypokalemia 01/28/2019  . Vitamin D deficiency 01/28/2019  . Gait disorder 07/18/2017  . Bilateral hearing loss 04/28/2017  . Acquired deformity of toenail 04/28/2017  . Nocturia 04/28/2017  . Encounter for well adult exam with abnormal findings 01/19/2017  . Neuralgia, postherpetic 05/27/2016  . Constipation 05/05/2016  . Shingles 05/05/2016  . OAB (overactive bladder) 05/05/2016  . Contusion 02/26/2016  . Impacted cerumen of both ears 09/25/2015  . Neutropenia ([place]) 09/25/2015  . Bilateral knee contractures 10/26/2013  . Sinusitis, acute 10/26/2013  . Dysuria 04/20/2012  . Other malaise and fatigue 04/20/2012  . Decubitus ulcers 08/10/2011  . Vision loss of left eye 04/06/2011  . Atrophic vaginitis 09/12/2010  . CHOLELITHIASIS, SYMPTOMATIC 10/06/2007  . HLD (hyperlipidemia) 06/16/2007  . ANEMIA-NOS 06/16/2007  .  Anxiety state 06/16/2007  . Depression 06/16/2007  . SKIN LESIONS, MULTIPLE 06/16/2007  . ADENOCARCINOMA, BREAST 11/12/2006  . MASTECTOMY, LEFT, HX OF 11/12/2006  . Allergic rhinitis 10/05/2006  . Rheumatoid arthritis ([place]) 10/05/2006    Past Surgical History:  Procedure Laterality Date  . ANKLE SURGERY    . CATARACT EXTRACTION    . masectomy     left breast     OB History    Gravida  9   Para  8   Term      Preterm      AB  1   Living  8     SAB  1   TAB      Ectopic      Multiple      Live Births              Family History  Problem Relation Age of Onset  . Diabetes Mother   . Cancer Father   . Cancer Sister   . Arthritis Son     Social History   Tobacco Use  . Smoking status: Former Research scientist (life sciences)  . Smokeless tobacco: Never Used  Substance Use Topics  . Alcohol use: No  . Drug use: No    Home Medications Prior to Admission medications   Medication Sig Start Date End Date Taking? Authorizing Provider  cetirizine (ZYRTEC) 10 MG tablet Take 1 tablet (10 mg total) by mouth daily. 08/11/18 08/11/19  [name], MD  citalopram ([place])  10 MG tablet Take 1 tablet (10 mg total) by mouth daily. 01/25/19 01/25/20  [name], MD  fexofenadine (ALLEGRA) 180 MG tablet Take 1 tablet (180 mg total) by mouth daily. 01/25/19 01/25/20  [name], MD  fluticasone [facility]) 50 MCG/ACT nasal spray SHAKE LIQUID AND USE 2 SPRAYS IN [facility] NOSTRIL DAILY 07/18/17   [name], MD  levothyroxine (SYNTHROID) 25 MCG tablet Take 1 tablet (25 mcg total) by mouth daily before breakfast. 08/11/18   [name], MD  loratadine (CLARITIN) 10 MG tablet Take 1 tablet (10 mg total) by mouth daily. 04/28/17   [name], MD  oxybutynin (DITROPAN-XL) 5 MG 24 hr tablet Take 1 tablet (5 mg total) by mouth at bedtime. 07/18/17   [name], MD  potassium chloride (KLOR-CON 10) 10 MEQ tablet Take 1 tablet (10 mEq total) by mouth daily for 3 days. 08/11/18 08/14/18  [name], MD    triamcinolone (NASACORT) 55 MCG/ACT AERO nasal inhaler Place 2 sprays into the nose daily. 08/11/18   [name], MD  Vitamin D, Ergocalciferol, (DRISDOL) 1.25 MG (50000 UT) CAPS capsule Take 1 capsule (50,000 Units total) by mouth every 7 (seven) days. 08/11/18   [name], MD    Allergies    Penicillins  Review of Systems   Review of Systems  Constitutional: Positive for activity change.  Respiratory: Negative for cough and shortness of breath.   Cardiovascular: Negative for chest pain.  Gastrointestinal: Negative for nausea and vomiting.  Musculoskeletal: Positive for arthralgias and myalgias.  All other systems reviewed and are negative.   Physical Exam Updated Vital Signs BP 140/85   Pulse 76   Temp 100.2 F (37.9 C) (Rectal)   Resp (!) 26   SpO2 100%   Physical Exam Vitals and nursing note reviewed.  Constitutional:      Appearance: She is well-developed.     Comments: Cachectic  HENT:     Head: Atraumatic.  Eyes:     Extraocular Movements: Extraocular movements intact.  Cardiovascular:     Rate and Rhythm: Normal rate.  Pulmonary:     Effort: Pulmonary effort is normal.  Abdominal:     General: Bowel sounds are normal.  Musculoskeletal:     Cervical back: Normal range of motion and neck supple.  Skin:    General: Skin is warm and dry.  Neurological:     General: No focal deficit present.     Mental Status: She is alert.     ED Results / Procedures / Treatments   Labs (all labs ordered are listed, but only abnormal results are displayed) Labs Reviewed  LACTIC ACID, PLASMA - Abnormal; Notable for the following components:      Result Value   Lactic Acid, Venous 2.1 (*)    All other components within normal limits  COMPREHENSIVE METABOLIC PANEL - Abnormal; Notable for the following components:   Calcium 8.3 (*)    Albumin 2.7 (*)    AST 42 (*)    Total Bilirubin 1.3 (*)    GFR calc non Af Amer 48 (*)    GFR calc Af Amer 55 (*)    All other  components within normal limits  CBC WITH DIFFERENTIAL/PLATELET - Abnormal; Notable for the following components:   RBC 5.17 (*)    HCT 46.5 (*)    MCHC 29.7 (*)    RDW 21.5 (*)    Platelets 899 (*)    nRBC  0.4 (*)    Lymphs Abs 0.4 (*)    Monocytes Absolute 1.1 (*)    Abs Immature Granulocytes 0.08 (*)    All other components within normal limits  URINALYSIS, ROUTINE W REFLEX MICROSCOPIC - Abnormal; Notable for the following components:   APPearance HAZY (*)    Hgb urine dipstick SMALL (*)    Protein, ur >=300 (*)    Bacteria, UA MANY (*)    All other components within normal limits  PROTIME-INR - Abnormal; Notable for the following components:   Prothrombin Time 17.2 (*)    INR 1.4 (*)    All other components within normal limits  APTT - Abnormal; Notable for the following components:   aPTT 47 (*)    All other components within normal limits  CULTURE, BLOOD (ROUTINE X 2)  CULTURE, BLOOD (ROUTINE X 2)  URINE CULTURE  RESPIRATORY PANEL BY RT PCR (FLU A&B, COVID)  LACTIC ACID, PLASMA  POC SARS CORONAVIRUS 2 AG -  ED    EKG EKG Interpretation  Date/Time:  Thursday March 08 2019 17:45:23 EST Ventricular Rate:  83 PR Interval:    QRS Duration: 137 QT Interval:  459 QTC Calculation: 540 R Axis:   117 Text Interpretation: Sinus arrhythmia RBBB and LPFB No acute changes No significant change since last tracing Nonspecific ST and T wave abnormality Confirmed by Varney Biles [id number]) on 03/08/2019 5:49:53 PM   Radiology DG Chest Port 1 View  Result Date: 03/08/2019 CLINICAL DATA:  Fall, fever EXAM: PORTABLE CHEST 1 VIEW COMPARISON:  Radiograph August 30, 2013 FINDINGS: Diffuse hazy interstitial opacities throughout both lungs with more patchy opacity in the right lung base and a small right pleural effusion. No pneumothorax. Cardiac silhouette is borderline enlarged. The aorta is calcified and tortuous. The osseous structures appear diffusely demineralized which may limit  detection of small or nondisplaced fractures. Degenerative changes are present in the imaged spine and shoulders. Stable dextrocurvature of the thoracic spine. Postsurgical changes in the left axilla. IMPRESSION: Diffuse hazy interstitial opacities throughout both lungs with more patchy opacity in the right lung base and a small right pleural effusion. Findings may represent pulmonary edema and/or multifocal pneumonia. Electronically Signed   By: Lovena Le M.D.   On: 03/08/2019 18:19    Procedures .Critical Care Performed by: Varney Biles, MD Authorized by: Varney Biles, MD   Critical care provider statement:    Critical care time (minutes):  36   Critical care was necessary to treat or prevent imminent or life-threatening deterioration of the following conditions:  Respiratory failure   Critical care was time spent personally by me on the following activities:  Discussions with consultants, evaluation of patient's response to treatment, examination of patient, ordering and performing treatments and interventions, ordering and review of laboratory studies, ordering and review of radiographic studies, pulse oximetry, re-evaluation of patient's condition, obtaining history from patient or surrogate and review of old charts   (including critical care time)  Medications Ordered in ED Medications  cefTRIAXone (ROCEPHIN) 1 g in sodium chloride 0.9 % 100 mL IVPB (1 g Intravenous New Bag/Given 03/08/19 1819)  azithromycin (ZITHROMAX) 500 mg in sodium chloride 0.9 % 250 mL IVPB (has no administration in time range)    ED Course  I have reviewed the triage vital signs and the nursing notes.  Pertinent labs & imaging results that were available during my care of the patient were reviewed by me and considered in my medical decision making (see chart for  details).  Clinical Course as of Mar 08 1926  Thu Mar 08, 2019  1919 Lactate is only mildly elevated.   Lactic Acid, Venous(!!): 2.1 [AN]    1919 Chest x-ray showing multifocal pneumonia. Point-of-care Covid test is negative.  DG Chest Port 1 View [AN]  1920 UA has pyuria and bacteriuria.  Patient given ceftriaxone and azithromycin.  Bacteria, UA(!): MANY [AN]  1920 Rapid PCR test ordered for COVID-19.  Respiratory Panel by RT PCR (Flu A&B, Covid) - Nasopharyngeal Swab [AN]  1920 Likely reactive.   [AN]    Clinical Course User Index [AN] Varney Biles, MD   MDM Rules/Calculators/A&P                      Jamaia DANIEAL HAUENSTEIN was evaluated in Emergency Department on 03/08/2019 for the symptoms described in the history of present illness. She was evaluated in the context of the global COVID-19 pandemic, which necessitated consideration that the patient might be at risk for infection with the SARS-CoV-2 virus that causes COVID-19. Institutional protocols and algorithms that pertain to the evaluation of patients at risk for COVID-19 are in a state of rapid change based on information released by regulatory bodies including the CDC and federal and state organizations. These policies and algorithms were followed during the patient's care in the ED.   86 year old female comes in a chief complaint of weakness. She lives at her home with family members, who reported that patient was unable to get up, and sent her to the ER.  Patient has no complaints from her side.  She is oriented to self and there is no focal neuro deficits.  Exam is not revealing any specific pathology.  Differential diagnosis includes COVID-19, UTI, pneumonia, cellulitis.  Additionally electrolyte abnormalities, renal failure also considered in the differential.    Final Clinical Impression(s) / ED Diagnoses Final diagnoses:  Cystitis  Weakness  Community acquired pneumonia, unspecified laterality  Thrombocytosis ([place])  Suspected COVID-19 virus infection    Rx / DC Orders ED Discharge Orders    None       Varney Biles, MD 03/08/19 1929

## 2019-03-08 NOTE — ED Triage Notes (Signed)
Pt BIBA from home with children.   Per EMS- Pt had 2 falls; 1 yesterday (witnessed), 1 today (unwitnessed).  EMS reports pt with malodorous urine. Pt does not take blood thinners according to family. No obvious injury, pt c/o pain when touched anywhere.   Pt wheelchair bound at home.

## 2019-03-08 NOTE — Telephone Encounter (Signed)
Is there [place] or [facility] involved?  If not perhaps this can be arranged (but often takes about 1 wk)  If pt is unable to ambulate and this is an unusual problem of very recent onset, or has any other unusual symptoms she should go to ED by EMS now.    If the ambulation issue is more of a long term issue, but the family is not able to manage this, this is also a perfectly good reason to call EMS for ED evalatuion and pt likely to be hospd for safety and care,  The rules say if the patient requires more than 3 midnights in the hospital, they would then be eligible for placement in NH  Please be aware I am not able to assist with a direct admission to a long term nursing care facility as her outpatient PCP, thanks

## 2019-03-09 ENCOUNTER — Inpatient Hospital Stay (HOSPITAL_COMMUNITY): Payer: Medicare Other

## 2019-03-09 ENCOUNTER — Other Ambulatory Visit: Payer: Self-pay

## 2019-03-09 ENCOUNTER — Encounter (HOSPITAL_COMMUNITY): Payer: Self-pay | Admitting: Internal Medicine

## 2019-03-09 DIAGNOSIS — I350 Nonrheumatic aortic (valve) stenosis: Secondary | ICD-10-CM

## 2019-03-09 DIAGNOSIS — I361 Nonrheumatic tricuspid (valve) insufficiency: Secondary | ICD-10-CM

## 2019-03-09 DIAGNOSIS — M0609 Rheumatoid arthritis without rheumatoid factor, multiple sites: Secondary | ICD-10-CM

## 2019-03-09 LAB — ECHOCARDIOGRAM COMPLETE
Height: 63 in
Weight: 1439.16 oz

## 2019-03-09 LAB — COMPREHENSIVE METABOLIC PANEL
ALT: 16 U/L (ref 0–44)
AST: 31 U/L (ref 15–41)
Albumin: 2.3 g/dL — ABNORMAL LOW (ref 3.5–5.0)
Alkaline Phosphatase: 62 U/L (ref 38–126)
Anion gap: 9 (ref 5–15)
BUN: 16 mg/dL (ref 8–23)
CO2: 22 mmol/L (ref 22–32)
Calcium: 8 mg/dL — ABNORMAL LOW (ref 8.9–10.3)
Chloride: 114 mmol/L — ABNORMAL HIGH (ref 98–111)
Creatinine, Ser: 0.8 mg/dL (ref 0.44–1.00)
GFR calc Af Amer: >60 mL/min (ref >60)
GFR calc non Af Amer: >60 mL/min (ref >60)
Glucose, Bld: 71 mg/dL (ref 70–99)
Potassium: 3 mmol/L — ABNORMAL LOW (ref 3.5–5.1)
Sodium: 145 mmol/L (ref 135–145)
Total Bilirubin: 1 mg/dL (ref 0.3–1.2)
Total Protein: 5.6 g/dL — ABNORMAL LOW (ref 6.5–8.1)

## 2019-03-09 LAB — CBC WITH DIFFERENTIAL/PLATELET
Abs Immature Granulocytes: 0.09 10*3/uL — ABNORMAL HIGH (ref 0.00–0.07)
Basophils Absolute: 0 10*3/uL (ref 0.0–0.1)
Basophils Relative: 1 %
Eosinophils Absolute: 0 10*3/uL (ref 0.0–0.5)
Eosinophils Relative: 0 %
HCT: 40.2 % (ref 36.0–46.0)
Hemoglobin: 11.7 g/dL — ABNORMAL LOW (ref 12.0–15.0)
Immature Granulocytes: 2 %
Lymphocytes Relative: 10 %
Lymphs Abs: 0.6 10*3/uL — ABNORMAL LOW (ref 0.7–4.0)
MCH: 26.4 pg (ref 26.0–34.0)
MCHC: 29.1 g/dL — ABNORMAL LOW (ref 30.0–36.0)
MCV: 90.7 fL (ref 80.0–100.0)
Monocytes Absolute: 1.5 10*3/uL — ABNORMAL HIGH (ref 0.1–1.0)
Monocytes Relative: 26 %
Neutro Abs: 3.6 10*3/uL (ref 1.7–7.7)
Neutrophils Relative %: 61 %
Platelets: 727 10*3/uL — ABNORMAL HIGH (ref 150–400)
RBC: 4.43 MIL/uL (ref 3.87–5.11)
RDW: 21 % — ABNORMAL HIGH (ref 11.5–15.5)
WBC: 5.8 10*3/uL (ref 4.0–10.5)
nRBC: 0 % (ref 0.0–0.2)

## 2019-03-09 LAB — URINE CULTURE

## 2019-03-09 LAB — LACTIC ACID, PLASMA: Lactic Acid, Venous: 1.4 mmol/L (ref 0.5–1.9)

## 2019-03-09 LAB — D-DIMER, QUANTITATIVE: D-Dimer, Quant: 2.14 ug/mL-FEU — ABNORMAL HIGH (ref 0.00–0.50)

## 2019-03-09 LAB — C-REACTIVE PROTEIN: CRP: 19.9 mg/dL — ABNORMAL HIGH (ref <1.0)

## 2019-03-09 MED ORDER — ENOXAPARIN SODIUM 30 MG/0.3ML ~~LOC~~ SOLN
30.0000 mg | SUBCUTANEOUS | Status: DC
  Administered 2019-03-09: 30 mg via SUBCUTANEOUS
  Filled 2019-03-09: qty 0.3

## 2019-03-09 MED ORDER — FLUTICASONE PROPIONATE 50 MCG/ACT NA SUSP
2.0000 | Freq: Every day | NASAL | Status: DC | PRN
  Filled 2019-03-09: qty 16

## 2019-03-09 MED ORDER — POTASSIUM CHLORIDE CRYS ER 20 MEQ PO TBCR
40.0000 meq | EXTENDED_RELEASE_TABLET | Freq: Two times a day (BID) | ORAL | Status: AC
  Administered 2019-03-09 (×2): 40 meq via ORAL
  Filled 2019-03-09 (×2): qty 2

## 2019-03-09 MED ORDER — VITAMIN D (ERGOCALCIFEROL) 1.25 MG (50000 UNIT) PO CAPS
50000.0000 [IU] | ORAL_CAPSULE | ORAL | Status: DC
  Administered 2019-03-09: 15:00:00 50000 [IU] via ORAL
  Filled 2019-03-09: qty 1

## 2019-03-09 MED ORDER — SODIUM CHLORIDE 0.9 % IV SOLN
500.0000 mg | INTRAVENOUS | Status: DC
  Administered 2019-03-09: 500 mg via INTRAVENOUS
  Filled 2019-03-09 (×2): qty 500

## 2019-03-09 MED ORDER — HYDROCODONE-ACETAMINOPHEN 5-325 MG PO TABS
1.0000 | ORAL_TABLET | Freq: Four times a day (QID) | ORAL | Status: DC | PRN
  Administered 2019-03-12 – 2019-03-14 (×3): 1 via ORAL
  Filled 2019-03-09 (×3): qty 1

## 2019-03-09 NOTE — Progress Notes (Signed)
Echocardiogram 2D Echocardiogram has been performed.  Oneal Deputy Fahad Cisse 03/09/2019, 11:52 AM

## 2019-03-09 NOTE — Progress Notes (Addendum)
This note also relates to the following rows which could not be included: ECG Heart Rate - Cannot attach notes to unvalidated device data Resp - Cannot attach notes to unvalidated device data  PT HR showing 146 on monitor MD and RRT paged.      03/09/19 1024  MEWS Score  BP 102/62 (rechek)  SpO2 97 %  O2 Device Nasal Cannula  O2 Flow Rate (L/min) 2 L/min  MEWS Score  MEWS Temp 0  MEWS Systolic 0  MEWS Pulse 3  MEWS RR 0  MEWS LOC 0  MEWS Score 3  MEWS Score Color Yellow  Rapid Response Notification  Name of Rapid Response RN Notified Zoe RN  Date Rapid Response Notified 03/09/19  Time Rapid Response Notified [id number]  Provider Notification  Provider Name/Title [email or web address]  Date Provider Notified 03/09/19  Time Provider Notified 1020  Notification Type Page  Notification Reason Other (Comment) (BP)  Response See new orders

## 2019-03-09 NOTE — TOC Progression Note (Signed)
Transition of Care [facility]) - Progression Note    Patient Details  Name: Angela [name] MRN: [id number] Date of Birth: November 07, 1918  Transition of [facility]) CM/SW Contact  Purcell Mouton, RN Phone Number: 03/09/2019, 12:58 PM  Clinical Narrative:    Pt from home with adult child and plan at present time to return. TOC will follow for discharge needs.   Expected Discharge Plan: [place] Barriers to Discharge: No Barriers Identified  Expected Discharge Plan and Services Expected Discharge Plan: [place] arrangements for the past 2 months: Single Family Home                                       Social Determinants of Health (SDOH) Interventions    Readmission Risk Interventions No flowsheet data found.

## 2019-03-09 NOTE — Progress Notes (Signed)
TRH on call paged about pr new results. Awaiting new orders at this time. Pt resting in bed in NAD. VSS  Will continue to assess and monitor.

## 2019-03-09 NOTE — Significant Event (Signed)
Rapid Response Event Note  Overview: Time Called: 1030 Arrival Time: 1031 Event Type: Cardiac  Initial Focused Assessment:  RRT called due to new onset sinus tachycardia rate of 146. Patient lying in bed calmly complaining of neck pain and saying she felt uncomfortable and felt trapped. Patient in no respiratory or otherwise acute distress. Breath sounds clear and diminished. Pulses palpable. No obvious cyanosis. Mucus membranes and tongue slightly pale.  Afebrile both oral and axillary. WBC unremarkable. 12 lead EKG completed showing sinus tach. MD ordered lactic acid. Due to elevated BNP plan to hold off on fluid bolus until lactic acid results. Tachycardia possibly related to pain. Patient repositioned. MD ordered pain medication, echo cardiogram, and IV antibiotics. RRT available for any additional needs. [phone]   Plan of Care (if not transferred):   Remain on telemetry 12 lead EKG ECHO Medicate for Pain Lab work- D-dimer, Lactic Acid   Event Summary: Name of Physician Notified: Pokhrel, L. MD at 1030   Outcome: Stayed in room and stabalized  Event End Time: [place]

## 2019-03-09 NOTE — Progress Notes (Signed)
Echo attempted at 10:25, will come back when vitals are more stable per RN.  [place]

## 2019-03-09 NOTE — Consult Note (Signed)
WOC consulted for sacral decubitus.  There are no wounds on this patient's sacrum.  Has prophylactic dressing in place for prevention.    Re consult if needed, will not follow at this time. Thanks  Jaimes Eckert [employer], RN,CWOCN, CNS, [place] [phone])

## 2019-03-09 NOTE — Progress Notes (Addendum)
PROGRESS NOTE  Angela [name] [id number] DOB: 08/10/1918 DOA: 03/08/2019 PCP: [name], MD   LOS: 1 day   Brief narrative: As per HPI,  Angela [name] is a 86 y.o. female with medical history significant of RA, history of breast cancer, anxiety, depression, anemia, advanced age presented to the hospital with generalized weakness and difficulty getting out of the bed or transfer to wheelchair for 2 days.  She also complained of neck and arm pain and generalized body discomfort.  No mention of fever cough or shortness of breath.  In the ED patient was noted to have a abnormal urinalysis and chest x-ray showed diffuse obesity.  She was febrile but white blood cell counts were normal.  Patient was started on Rocephin and Zithromax for possible community-acquired pneumonia and UTI.  Influenza test was negative.   Assessment/Plan:  Principal Problem:   Multifocal pneumonia Active Problems:   Rheumatoid arthritis ([place])   Generalized weakness   Acute lower UTI  Generalized weakness, difficulty with ADLs/transfer to wheelchair.  Could be secondary to pneumonia and UTI.  Patient does have a history of rheumatoid arthritis too.  Continue on Rocephin and Zithromax for now.  Follow blood cultures.  COVID-19 and influenza was negative.  Continue to monitor closely.  Get physical therapy evaluation.  Temperature max of 100.2 F.  Lactate was mildly elevated at 2.1 on presentation.  No leukocytosis.  TSH normal at 4.06.  Community-acquired pneumonia with acute hypoxic respiratory failure.  On 4 L of oxygen by nasal cannula currently saturating 97%..  Chest x-ray shows hazy interstitial opacities throughout the both lungs, but patient does have history of rheumatoid arthritis as well.Marland Kitchen  Poor historian.  On Rocephin and Zithromax in view of fever..  Follow blood cultures.  Febrile on presentation CRP elevated at 19.9.Marland Kitchen  Procalcitonin elevated at 2.2.  BNP elevated at 1203.  Will check 2D  echocardiogram.  Patient does not appear to be volume overloaded.  UTI.  Follow urine cultures.  On Rocephin IV.  Will continue for now.  History of rheumatoid arthritis.  Continue supportive care  Thrombocythemia.  Will closely monitor.  Hypokalemia..  Potassium 3.0 today.  Will replenish.  Check BMP in a.m.  Hypothyroidism.  On Synthroid.  We will continue with that.  VTE Prophylaxis: Lovenox subcu  Code Status: Full code  Family Communication: I was unable to reach the patient's daughter on the home phone provided.  I tried to call the patient's son Mr. [name] on the home phone and mobile phone but was unable to reach him as well.  Disposition Plan:  . Patient is from home . Likely disposition to undetermined at this time.  Might need skilled nursing facility.  Will need PT evaluation. . Barriers to discharge: UTI, pneumonia on IV antibiotics, follow urine cultures, blood cultures, wean oxygen as able, will need PT evaluation for disposition  Consultants:  None  Procedures:  None  Antibiotics:  . Rocephin and Zithromax  Anti-infectives (From admission, onward)   Start     Dose/Rate Route Frequency Ordered Stop   03/08/19 2000  azithromycin (ZITHROMAX) 500 mg in sodium chloride 0.9 % 250 mL IVPB     500 mg 250 mL/hr over 60 Minutes Intravenous  Once 03/08/19 1914 03/08/19 2234   03/08/19 1800  cefTRIAXone (ROCEPHIN) 1 g in sodium chloride 0.9 % 100 mL IVPB     1 g 200 mL/hr over 30 Minutes Intravenous Every 24 hours 03/08/19 1751  Subjective: Today, patient was seen and examined at bedside.  Patient is hard of hearing.  Denies overt pain or shortness of breath.  Mumbles on asking.  Objective: Vitals:   03/08/19 2200 03/09/19 0605  BP: 135/74 130/67  Pulse: 90 78  Resp: 16 16  Temp: 98.9 F (37.2 C) 97.8 F (36.6 C)  SpO2: 97% 97%    Intake/Output Summary (Last 24 hours) at 03/09/2019 0902 Last data filed at 03/09/2019 0600 Gross per 24 hour  Intake  100 ml  Output --  Net 100 ml   Filed Weights   03/08/19 2200  Weight: 40.8 kg   Body mass index is 15.93 kg/m.   Physical Exam:  GENERAL: Patient is alert awake, hard of hearing, mumbles on asking.  Not in obvious distress.  Thinly built. HENT: No scleral pallor or icterus. Pupils equally reactive to light. Oral mucosa is moist NECK: is supple, no gross swelling noted. CHEST: Diminished breath sounds bilaterally. CVS: S1 and S2 heard, no murmur. Regular rate and rhythm.  ABDOMEN: Soft, non-tender, bowel sounds are present.  External catheter in place. EXTREMITIES: No edema. CNS: Flexed extremities.  Moving all extremities. SKIN: warm and dry without rashes.  Data Review: I have personally reviewed the following laboratory data and studies,  CBC: Recent Labs  Lab 03/08/19 1750 03/09/19 0444  WBC 5.6 5.8  NEUTROABS 4.0 3.6  HGB 13.8 11.7*  HCT 46.5* 40.2  MCV 89.9 90.7  PLT 899* [id number]*   Basic Metabolic Panel: Recent Labs  Lab 03/08/19 1750 03/09/19 0444  NA 144 145  K 3.7 3.0*  CL 110 114*  CO2 24 22  GLUCOSE 95 71  BUN 15 16  CREATININE 0.98 0.80  CALCIUM 8.3* 8.0*   Liver Function Tests: Recent Labs  Lab 03/08/19 1750 03/09/19 0444  AST 42* 31  ALT 17 16  ALKPHOS 78 62  BILITOT 1.3* 1.0  PROT 6.8 5.6*  ALBUMIN 2.7* 2.3*   No results for input(s): LIPASE, AMYLASE in the last 168 hours. No results for input(s): AMMONIA in the last 168 hours. Cardiac Enzymes: No results for input(s): CKTOTAL, CKMB, CKMBINDEX, TROPONINI in the last 168 hours. BNP (last 3 results) Recent Labs    03/08/19 1947  BNP 1,203.1*    ProBNP (last 3 results) No results for input(s): PROBNP in the last 8760 hours.  CBG: No results for input(s): GLUCAP in the last 168 hours. Recent Results (from the past 240 hour(s))  Respiratory Panel by RT PCR (Flu A&B, Covid) - Nasopharyngeal Swab     Status: None   Collection Time: 03/08/19  7:16 PM   Specimen: Nasopharyngeal Swab   Result Value Ref Range Status   SARS Coronavirus 2 by RT PCR NEGATIVE NEGATIVE Final    Comment: (NOTE) SARS-CoV-2 target nucleic acids are NOT DETECTED. The SARS-CoV-2 RNA is generally detectable in upper respiratoy specimens during the acute phase of infection. The lowest concentration of SARS-CoV-2 viral copies this assay can detect is 131 copies/mL. A negative result does not preclude SARS-Cov-2 infection and should not be used as the sole basis for treatment or other patient management decisions. A negative result may occur with  improper specimen collection/handling, submission of specimen other than nasopharyngeal swab, presence of viral mutation(s) within the areas targeted by this assay, and inadequate number of viral copies (<131 copies/mL). A negative result must be combined with clinical observations, patient history, and epidemiological information. The expected result is Negative. Fact Sheet for Patients:  PinkCheek.be Fact  Sheet for Healthcare Providers:  GravelBags.it This test is not yet ap proved or cleared by the [place] FDA and  has been authorized for detection and/or diagnosis of SARS-CoV-2 by FDA under an Emergency Use Authorization (EUA). This EUA will remain  in effect (meaning this test can be used) for the duration of the COVID-19 declaration under Section 564(b)(1) of the Act, 21 U.S.C. section 360bbb-3(b)(1), unless the authorization is terminated or revoked sooner.    Influenza A by PCR NEGATIVE NEGATIVE Final   Influenza B by PCR NEGATIVE NEGATIVE Final    Comment: (NOTE) The Xpert Xpress SARS-CoV-2/FLU/RSV assay is intended as an aid in  the diagnosis of influenza from Nasopharyngeal swab specimens and  should not be used as a sole basis for treatment. Nasal washings and  aspirates are unacceptable for Xpert Xpress SARS-CoV-2/FLU/RSV  testing. Fact Sheet for  Patients: PinkCheek.be Fact Sheet for Healthcare Providers: GravelBags.it This test is not yet approved or cleared by the [place] FDA and  has been authorized for detection and/or diagnosis of SARS-CoV-2 by  FDA under an Emergency Use Authorization (EUA). This EUA will remain  in effect (meaning this test can be used) for the duration of the  Covid-19 declaration under Section 564(b)(1) of the Act, 21  U.S.C. section 360bbb-3(b)(1), unless the authorization is  terminated or revoked. Performed at [facility], [place] [address], [place],  [address]      Studies: DG Shoulder Right  Result Date: 03/08/2019 CLINICAL DATA:  Right shoulder pain, no known injury, initial encounter EXAM: RIGHT SHOULDER - 2+ VIEW COMPARISON:  None. FINDINGS: Degenerative changes of the glenohumeral joint are seen. Some deformity of the proximal humerus is seen likely related to prior fracture with healing. Degenerative changes of the acromioclavicular joint are noted as well. The underlying bony thorax shows no acute abnormality. IMPRESSION: Chronic changes in the shoulder without acute abnormality. Electronically Signed   By: Inez Catalina M.D.   On: 03/08/2019 21:30   CT HEAD WO CONTRAST  Result Date: 03/08/2019 CLINICAL DATA:  Recent falls with headaches and neck pain, initial encounter EXAM: CT HEAD WITHOUT CONTRAST CT CERVICAL SPINE WITHOUT CONTRAST TECHNIQUE: Multidetector CT imaging of the head and cervical spine was performed following the standard protocol without intravenous contrast. Multiplanar CT image reconstructions of the cervical spine were also generated. COMPARISON:  07/19/2009 FINDINGS: CT HEAD FINDINGS Brain: Heavy calcification of the basal ganglia is seen bilaterally and stable. Increasing chronic white matter ischemic change is noted. Mild atrophic changes are seen as well. Vascular: No hyperdense vessel or  unexpected calcification. Skull: Normal. Negative for fracture or focal lesion. Sinuses/Orbits: No acute finding. Other: None. CT CERVICAL SPINE FINDINGS Alignment: Within normal limits. Skull base and vertebrae: Exam is somewhat limited by patient motion artifact. Seven cervical segments are well visualized. Vertebral body height is well maintained. Disc space narrowing is noted from C2-C6 with mild osteophytic changes. No acute fracture or acute facet abnormality is noted. The odontoid is within normal limits. Soft tissues and spinal canal: Surrounding soft tissue structures show no acute abnormality. Upper chest: Visualized lung apices are unremarkable. Other: None IMPRESSION: CT of the head: Chronic atrophic and ischemic changes without acute abnormality. CT of the cervical spine: Somewhat limited exam due to patient motion artifact. Multilevel degenerative change is seen without acute abnormality. Electronically Signed   By: Inez Catalina M.D.   On: 03/08/2019 21:39   CT CERVICAL SPINE WO CONTRAST  Result Date: 03/08/2019 CLINICAL  DATA:  Recent falls with headaches and neck pain, initial encounter EXAM: CT HEAD WITHOUT CONTRAST CT CERVICAL SPINE WITHOUT CONTRAST TECHNIQUE: Multidetector CT imaging of the head and cervical spine was performed following the standard protocol without intravenous contrast. Multiplanar CT image reconstructions of the cervical spine were also generated. COMPARISON:  07/19/2009 FINDINGS: CT HEAD FINDINGS Brain: Heavy calcification of the basal ganglia is seen bilaterally and stable. Increasing chronic white matter ischemic change is noted. Mild atrophic changes are seen as well. Vascular: No hyperdense vessel or unexpected calcification. Skull: Normal. Negative for fracture or focal lesion. Sinuses/Orbits: No acute finding. Other: None. CT CERVICAL SPINE FINDINGS Alignment: Within normal limits. Skull base and vertebrae: Exam is somewhat limited by patient motion artifact. Seven  cervical segments are well visualized. Vertebral body height is well maintained. Disc space narrowing is noted from C2-C6 with mild osteophytic changes. No acute fracture or acute facet abnormality is noted. The odontoid is within normal limits. Soft tissues and spinal canal: Surrounding soft tissue structures show no acute abnormality. Upper chest: Visualized lung apices are unremarkable. Other: None IMPRESSION: CT of the head: Chronic atrophic and ischemic changes without acute abnormality. CT of the cervical spine: Somewhat limited exam due to patient motion artifact. Multilevel degenerative change is seen without acute abnormality. Electronically Signed   By: Inez Catalina M.D.   On: 03/08/2019 21:39   DG Chest Port 1 View  Result Date: 03/08/2019 CLINICAL DATA:  Fall, fever EXAM: PORTABLE CHEST 1 VIEW COMPARISON:  Radiograph August 30, 2013 FINDINGS: Diffuse hazy interstitial opacities throughout both lungs with more patchy opacity in the right lung base and a small right pleural effusion. No pneumothorax. Cardiac silhouette is borderline enlarged. The aorta is calcified and tortuous. The osseous structures appear diffusely demineralized which may limit detection of small or nondisplaced fractures. Degenerative changes are present in the imaged spine and shoulders. Stable dextrocurvature of the thoracic spine. Postsurgical changes in the left axilla. IMPRESSION: Diffuse hazy interstitial opacities throughout both lungs with more patchy opacity in the right lung base and a small right pleural effusion. Findings may represent pulmonary edema and/or multifocal pneumonia. Electronically Signed   By: Lovena Le M.D.   On: 03/08/2019 18:19      Flora Lipps, MD  [facility] 03/09/2019

## 2019-03-10 DIAGNOSIS — L8990 Pressure ulcer of unspecified site, unspecified stage: Secondary | ICD-10-CM | POA: Insufficient documentation

## 2019-03-10 LAB — COMPREHENSIVE METABOLIC PANEL
ALT: 18 U/L (ref 0–44)
AST: 33 U/L (ref 15–41)
Albumin: 2.1 g/dL — ABNORMAL LOW (ref 3.5–5.0)
Alkaline Phosphatase: 62 U/L (ref 38–126)
Anion gap: 8 (ref 5–15)
BUN: 24 mg/dL — ABNORMAL HIGH (ref 8–23)
CO2: 23 mmol/L (ref 22–32)
Calcium: 8 mg/dL — ABNORMAL LOW (ref 8.9–10.3)
Chloride: 118 mmol/L — ABNORMAL HIGH (ref 98–111)
Creatinine, Ser: 1.14 mg/dL — ABNORMAL HIGH (ref 0.44–1.00)
GFR calc Af Amer: 46 mL/min — ABNORMAL LOW (ref >60)
GFR calc non Af Amer: 40 mL/min — ABNORMAL LOW (ref >60)
Glucose, Bld: 65 mg/dL — ABNORMAL LOW (ref 70–99)
Potassium: 3.9 mmol/L (ref 3.5–5.1)
Sodium: 149 mmol/L — ABNORMAL HIGH (ref 135–145)
Total Bilirubin: 0.7 mg/dL (ref 0.3–1.2)
Total Protein: 5.4 g/dL — ABNORMAL LOW (ref 6.5–8.1)

## 2019-03-10 LAB — PROCALCITONIN: Procalcitonin: 2.77 ng/mL

## 2019-03-10 LAB — MAGNESIUM: Magnesium: 2.1 mg/dL (ref 1.7–2.4)

## 2019-03-10 LAB — CBC WITH DIFFERENTIAL/PLATELET
Abs Immature Granulocytes: 0.04 10*3/uL (ref 0.00–0.07)
Basophils Absolute: 0 10*3/uL (ref 0.0–0.1)
Basophils Relative: 1 %
Eosinophils Absolute: 0 10*3/uL (ref 0.0–0.5)
Eosinophils Relative: 0 %
HCT: 37.6 % (ref 36.0–46.0)
Hemoglobin: 10.8 g/dL — ABNORMAL LOW (ref 12.0–15.0)
Immature Granulocytes: 1 %
Lymphocytes Relative: 16 %
Lymphs Abs: 0.6 10*3/uL — ABNORMAL LOW (ref 0.7–4.0)
MCH: 26.9 pg (ref 26.0–34.0)
MCHC: 28.7 g/dL — ABNORMAL LOW (ref 30.0–36.0)
MCV: 93.5 fL (ref 80.0–100.0)
Monocytes Absolute: 0.6 10*3/uL (ref 0.1–1.0)
Monocytes Relative: 15 %
Neutro Abs: 2.7 10*3/uL (ref 1.7–7.7)
Neutrophils Relative %: 67 %
Platelets: 765 10*3/uL — ABNORMAL HIGH (ref 150–400)
RBC: 4.02 MIL/uL (ref 3.87–5.11)
RDW: 21.4 % — ABNORMAL HIGH (ref 11.5–15.5)
WBC: 4 10*3/uL (ref 4.0–10.5)
nRBC: 0 % (ref 0.0–0.2)

## 2019-03-10 LAB — C-REACTIVE PROTEIN: CRP: 20.4 mg/dL — ABNORMAL HIGH (ref <1.0)

## 2019-03-10 MED ORDER — AZITHROMYCIN 250 MG PO TABS
500.0000 mg | ORAL_TABLET | Freq: Every day | ORAL | Status: AC
  Administered 2019-03-10 – 2019-03-12 (×3): 500 mg via ORAL
  Filled 2019-03-10 (×3): qty 2

## 2019-03-10 MED ORDER — HEPARIN SODIUM (PORCINE) 5000 UNIT/ML IJ SOLN
5000.0000 [IU] | Freq: Three times a day (TID) | INTRAMUSCULAR | Status: DC
  Administered 2019-03-10 – 2019-03-14 (×9): 5000 [IU] via SUBCUTANEOUS
  Filled 2019-03-10 (×8): qty 1

## 2019-03-10 MED ORDER — DEXTROSE-NACL 5-0.45 % IV SOLN: INTRAVENOUS | Status: DC

## 2019-03-10 NOTE — Progress Notes (Signed)
PT Cancellation Note  Patient Details Name: Angela [name] MRN: [id number] DOB: [birth year]   Cancelled Treatment:    Reason Eval/Treat Not Completed: Medical issues which prohibited therapy patient now getting doppler for DVT r/o- will return when results of study are available and she is medically appropriate.    Windell Norfolk, DPT, PN1   Supplemental Physical Therapist [facility]    Pager [phone] Acute Rehab Office [phone]

## 2019-03-10 NOTE — Progress Notes (Signed)
PROGRESS NOTE  Angela [name]  [id number] DOB: [birth year] DOA: 03/08/2019 PCP: [name], MD   Brief Narrative: Angela [name] is a 86yo female with a history of remote breast CA s/p mastectomy, RA, anxiety, and depression with recent decreased functional independence who was brought by EMS from home on 3/4 due to increasing weakness, inability to transfer to wheelchair per usual as well as pain all over. In the ED, Temperature was 100.37F, WBC wnl, UA revealed pyuria and many bacteria, CXR demonstrated bilateral hazy opacities with patchy infiltrates as well. SARS-CoV-2 Ag and subsequent PCR were both negative as was influenza testing. Ceftriaxone and azithromycin were administered, supplemental oxygen given, and admission requested. BNP elevated with uncertain significance.   Assessment & Plan: Principal Problem:   Multifocal pneumonia Active Problems:   Rheumatoid arthritis ([place])   Generalized weakness   Acute lower UTI  Advanced age, debility: I've discussed goals of care with the patient and her son, with whom she lives. They agree certainly to DNR status along with full measures at this time. Poor overall prognosis regarding longevity as well as debility inherent in this patient was also reviewed.  Multifocal pneumonia: With significant elevation in both CRP and PCT more suggestive of bacterial component.  - Initially noted to have mildly elevated AST, has ongoing lymphopenia without neutrophilia. In constellation with CXR findings and elevated inflammatory markers including elevated d-dimer, suspicion for covid-19 remains high despite negative testing. Thrombocytosis argues for bacterial infection however. Urinalysis appears infected with polyclonal growth on culture. PCT trending upward despite abx, awaiting repeat CRP. Repeat CXR in AM.   Hypernatremia:  - Sodium trending upward. Difficult to assess volume status, though creatinine also elevated. Push per oral intake. Start  hypotonic replacement fluid with dextrose in light of hypoglycemia this AM.  Severe protein calorie malnutrition: Diffuse cachectic findings on exam, BMI is 15.  - Supplement as able  Leg swelling: Possibly related to hypoalbuminemia due to nutritional deficiency.  - D-dimer elevated with LE edema, will check venous U/S. - Diffuse edema in very malnourished-appearing woman, hypoalbuminemia likely multifactorial with decreased muscle mass as well as proteinuria. Check spot urine sample to roughly quantify proteinuria.   AKI on CKD NOS: Unclear renal function at baseline, though minimal elevation of creatinine in the setting of sarcopenia tends to underestimate renal impairment.  - Avoid nephrotoxins - Start low volume of IV fluid. Note BNP is elevated and there is some edema, though no severe heart failure noted on echo, LVEF 60-65% and edema likely nutritionally (oncotically) mediated.   DVT prophylaxis: Enoxaparin > heparin [place] due to CrCl ~22ml/min and worsening and weight only 40kg. Code Status: DNR confirmed Family Communication: Son at bedside, they will have a family meeting later today.  Disposition Plan: Uncertain, pending clinical progress. Remains ill with high risk of life-ending deterioration.   Consultants:  Palliative care medicine team  Procedures:   Echocardiogram 3/5:  1. Left ventricular ejection fraction, by estimation, is 60 to 65%. The  left ventricle has normal function. The left ventricle has no regional  wall motion abnormalities. Left ventricular diastolic parameters are  consistent with Grade I diastolic  dysfunction (impaired relaxation).  2. Right ventricular systolic function is normal. The right ventricular  size is normal. There is moderately elevated pulmonary artery systolic  pressure.  3. Left atrial size was moderately dilated.  4. Right atrial size was moderately dilated.  5. The mitral valve is normal in structure and function. Trivial mitral  valve regurgitation.  6. Tricuspid valve regurgitation is moderate.  7. The aortic valve is tricuspid. Aortic valve regurgitation is trivial.  Moderate aortic valve stenosis.   Antimicrobials:  Ceftriaxone, azithromycin   Subjective: Denies any current pain, not hungry or interested in speaking with me really. Denies shortness of breath, feeling febrile, or cough. Denies dysuria.   Objective: Vitals:   03/09/19 1846 03/09/19 2016 03/10/19 0018 03/10/19 0421  BP:  (!) 110/59 138/73 (!) 148/73  Pulse:  81 81 81  Resp:  20 19 18   Temp:  (!) 97.4 F (36.3 C) 98.1 F (36.7 C) 98.2 F (36.8 C)  TempSrc:  Oral Oral Oral  SpO2: 98% 96% 94% 94%  Weight:      Height:        Intake/Output Summary (Last 24 hours) at 03/10/2019 1008 Last data filed at 03/09/2019 1400 Gross per 24 hour  Intake 250 ml  Output --  Net 250 ml   Filed Weights   03/08/19 2200  Weight: 40.8 kg    Gen: Elderly, cachectic female in no distress Pulm: Non-labored breathing at rest. No crackles. No wheezes.  CV: Regular rate and rhythm. No murmur, rub, or gallop. No JVD, diffuse pitting edema. GI: Abdomen soft, non-tender, non-distended, with normoactive bowel sounds. No organomegaly or masses felt. Ext: Warm, no deformities Skin: No rashes, lesions or ulcers on visualized skin. Neuro: Alert and mostly oriented. No focal neurological deficits. Psych: Judgement and insight appear fair. Mood & affect appropriate.   Data Reviewed: I have personally reviewed following labs and imaging studies  CBC: Recent Labs  Lab 03/08/19 1750 03/09/19 0444 03/10/19 0343  WBC 5.6 5.8 4.0  NEUTROABS 4.0 3.6 2.7  HGB 13.8 11.7* 10.8*  HCT 46.5* 40.2 37.6  MCV 89.9 90.7 93.5  PLT 899* 727* [id number]*   Basic Metabolic Panel: Recent Labs  Lab 03/08/19 1750 03/09/19 0444 03/10/19 0343  NA 144 145 149*  K 3.7 3.0* 3.9  CL 110 114* 118*  CO2 24 22 23   GLUCOSE 95 71 65*  BUN 15 16 24*  CREATININE 0.98 0.80 1.14*   CALCIUM 8.3* 8.0* 8.0*  MG  --   --  2.1   GFR: Estimated Creatinine Clearance: 17.3 mL/min (A) (by C-G formula based on SCr of 1.14 mg/dL (H)). Liver Function Tests: Recent Labs  Lab 03/08/19 1750 03/09/19 0444 03/10/19 0343  AST 42* 31 33  ALT 17 16 18   ALKPHOS 78 62 62  BILITOT 1.3* 1.0 0.7  PROT 6.8 5.6* 5.4*  ALBUMIN 2.7* 2.3* 2.1*   No results for input(s): LIPASE, AMYLASE in the last 168 hours. No results for input(s): AMMONIA in the last 168 hours. Coagulation Profile: Recent Labs  Lab 03/08/19 1810  INR 1.4*   Cardiac Enzymes: No results for input(s): CKTOTAL, CKMB, CKMBINDEX, TROPONINI in the last 168 hours. BNP (last 3 results) No results for input(s): PROBNP in the last 8760 hours. HbA1C: No results for input(s): HGBA1C in the last 72 hours. CBG: No results for input(s): GLUCAP in the last 168 hours. Lipid Profile: No results for input(s): CHOL, HDL, LDLCALC, TRIG, CHOLHDL, LDLDIRECT in the last 72 hours. Thyroid Function Tests: Recent Labs    03/08/19 1948  TSH 4.063   Anemia Panel: No results for input(s): VITAMINB12, FOLATE, FERRITIN, TIBC, IRON, RETICCTPCT in the last 72 hours. Urine analysis:    Component Value Date/Time   COLORURINE YELLOW 03/08/2019 1724   APPEARANCEUR HAZY (A) 03/08/2019 1724   LABSPEC 1.016  03/08/2019 1724   PHURINE 6.0 03/08/2019 1724   GLUCOSEU NEGATIVE 03/08/2019 1724   GLUCOSEU NEGATIVE 01/19/2017 1346   HGBUR SMALL (A) 03/08/2019 1724   BILIRUBINUR NEGATIVE 03/08/2019 1724   BILIRUBINUR neg 03/09/2013 1447   [place] 03/08/2019 1724   PROTEINUR >=300 (A) 03/08/2019 1724   UROBILINOGEN 0.2 01/19/2017 1346   NITRITE NEGATIVE 03/08/2019 1724   LEUKOCYTESUR NEGATIVE 03/08/2019 1724   Recent Results (from the past 240 hour(s))  Urine culture     Status: Abnormal   Collection Time: 03/08/19  5:24 PM   Specimen: In/Out Cath Urine  Result Value Ref Range Status   Specimen Description   Final    IN/OUT  CATH URINE Performed at [facility], [place] [address] [place], [place] [address]    Special Requests   Final    NONE Performed at [facility], [place] [address] [place], [place] [address]    Culture MULTIPLE SPECIES PRESENT, SUGGEST RECOLLECTION (A)  Final   Report Status 03/09/2019 FINAL  Final  Blood Culture (routine x 2)     Status: None (Preliminary result)   Collection Time: 03/08/19  5:50 PM   Specimen: BLOOD RIGHT FOREARM  Result Value Ref Range Status   Specimen Description   Final    BLOOD RIGHT FOREARM Performed at [facility] [address], [place], [place] [address]    Special Requests   Final    BOTTLES DRAWN AEROBIC AND ANAEROBIC Blood Culture results may not be optimal due to an inadequate volume of blood received in culture bottles Performed at [facility] [address], [place], [place] [address]    Culture   Final    NO GROWTH < 24 HOURS Performed at [facility] [address], [place], [place] [address]    Report Status PENDING  Incomplete  Blood Culture (routine x 2)     Status: None (Preliminary result)   Collection Time: 03/08/19  6:10 PM   Specimen: BLOOD  Result Value Ref Range Status   Specimen Description   Final    BLOOD RIGHT ANTECUBITAL Performed at [facility] [address], [place], [place] [address]    Special Requests   Final    BOTTLES DRAWN AEROBIC AND ANAEROBIC Blood Culture adequate volume Performed at [facility] [address], [place], [place] [address]    Culture   Final    NO GROWTH < 24 HOURS Performed at [facility] [address], [place], [place] [address]    Report Status PENDING  Incomplete  Respiratory Panel by RT PCR (Flu A&B, Covid) - Nasopharyngeal Swab     Status: None   Collection Time: 03/08/19  7:16 PM   Specimen: Nasopharyngeal Swab  Result Value Ref Range Status   SARS  Coronavirus 2 by RT PCR NEGATIVE NEGATIVE Final    Comment: (NOTE) SARS-CoV-2 target nucleic acids are NOT DETECTED. The SARS-CoV-2 RNA is generally detectable in upper respiratoy specimens during the acute phase of infection. The lowest concentration of SARS-CoV-2 viral copies this assay can detect is 131 copies/mL. A negative result does not preclude SARS-Cov-2 infection and should not be used as the sole basis for treatment or other patient management decisions. A negative result may occur with  improper specimen collection/handling, submission of specimen other than nasopharyngeal swab, presence of viral mutation(s) within the areas targeted by this assay, and inadequate number of viral copies (<131  copies/mL). A negative result must be combined with clinical observations, patient history, and epidemiological information. The expected result is Negative. Fact Sheet for Patients:  PinkCheek.be Fact Sheet for Healthcare Providers:  GravelBags.it This test is not yet ap proved or cleared by the [place] FDA and  has been authorized for detection and/or diagnosis of SARS-CoV-2 by FDA under an Emergency Use Authorization (EUA). This EUA will remain  in effect (meaning this test can be used) for the duration of the COVID-19 declaration under Section 564(b)(1) of the Act, 21 U.S.C. section 360bbb-3(b)(1), unless the authorization is terminated or revoked sooner.    Influenza A by PCR NEGATIVE NEGATIVE Final   Influenza B by PCR NEGATIVE NEGATIVE Final    Comment: (NOTE) The Xpert Xpress SARS-CoV-2/FLU/RSV assay is intended as an aid in  the diagnosis of influenza from Nasopharyngeal swab specimens and  should not be used as a sole basis for treatment. Nasal washings and  aspirates are unacceptable for Xpert Xpress SARS-CoV-2/FLU/RSV  testing. Fact Sheet for Patients: PinkCheek.be Fact Sheet  for Healthcare Providers: GravelBags.it This test is not yet approved or cleared by the [place] FDA and  has been authorized for detection and/or diagnosis of SARS-CoV-2 by  FDA under an Emergency Use Authorization (EUA). This EUA will remain  in effect (meaning this test can be used) for the duration of the  Covid-19 declaration under Section 564(b)(1) of the Act, 21  U.S.C. section 360bbb-3(b)(1), unless the authorization is  terminated or revoked. Performed at [facility], [place] [address] [place], [place] [address]       Radiology Studies: DG Shoulder Right  Result Date: 03/08/2019 CLINICAL DATA:  Right shoulder pain, no known injury, initial encounter EXAM: RIGHT SHOULDER - 2+ VIEW COMPARISON:  None. FINDINGS: Degenerative changes of the glenohumeral joint are seen. Some deformity of the proximal humerus is seen likely related to prior fracture with healing. Degenerative changes of the acromioclavicular joint are noted as well. The underlying bony thorax shows no acute abnormality. IMPRESSION: Chronic changes in the shoulder without acute abnormality. Electronically Signed   By: Inez Catalina M.D.   On: 03/08/2019 21:30   CT HEAD WO CONTRAST  Result Date: 03/08/2019 CLINICAL DATA:  Recent falls with headaches and neck pain, initial encounter EXAM: CT HEAD WITHOUT CONTRAST CT CERVICAL SPINE WITHOUT CONTRAST TECHNIQUE: Multidetector CT imaging of the head and cervical spine was performed following the standard protocol without intravenous contrast. Multiplanar CT image reconstructions of the cervical spine were also generated. COMPARISON:  07/19/2009 FINDINGS: CT HEAD FINDINGS Brain: Heavy calcification of the basal ganglia is seen bilaterally and stable. Increasing chronic white matter ischemic change is noted. Mild atrophic changes are seen as well. Vascular: No hyperdense vessel or unexpected calcification. Skull: Normal. Negative for  fracture or focal lesion. Sinuses/Orbits: No acute finding. Other: None. CT CERVICAL SPINE FINDINGS Alignment: Within normal limits. Skull base and vertebrae: Exam is somewhat limited by patient motion artifact. Seven cervical segments are well visualized. Vertebral body height is well maintained. Disc space narrowing is noted from C2-C6 with mild osteophytic changes. No acute fracture or acute facet abnormality is noted. The odontoid is within normal limits. Soft tissues and spinal canal: Surrounding soft tissue structures show no acute abnormality. Upper chest: Visualized lung apices are unremarkable. Other: None IMPRESSION: CT of the head: Chronic atrophic and ischemic changes without acute abnormality. CT of the cervical spine: Somewhat limited exam due to patient motion artifact. Multilevel degenerative change is seen  without acute abnormality. Electronically Signed   By: Inez Catalina M.D.   On: 03/08/2019 21:39   CT CERVICAL SPINE WO CONTRAST  Result Date: 03/08/2019 CLINICAL DATA:  Recent falls with headaches and neck pain, initial encounter EXAM: CT HEAD WITHOUT CONTRAST CT CERVICAL SPINE WITHOUT CONTRAST TECHNIQUE: Multidetector CT imaging of the head and cervical spine was performed following the standard protocol without intravenous contrast. Multiplanar CT image reconstructions of the cervical spine were also generated. COMPARISON:  07/19/2009 FINDINGS: CT HEAD FINDINGS Brain: Heavy calcification of the basal ganglia is seen bilaterally and stable. Increasing chronic white matter ischemic change is noted. Mild atrophic changes are seen as well. Vascular: No hyperdense vessel or unexpected calcification. Skull: Normal. Negative for fracture or focal lesion. Sinuses/Orbits: No acute finding. Other: None. CT CERVICAL SPINE FINDINGS Alignment: Within normal limits. Skull base and vertebrae: Exam is somewhat limited by patient motion artifact. Seven cervical segments are well visualized. Vertebral body  height is well maintained. Disc space narrowing is noted from C2-C6 with mild osteophytic changes. No acute fracture or acute facet abnormality is noted. The odontoid is within normal limits. Soft tissues and spinal canal: Surrounding soft tissue structures show no acute abnormality. Upper chest: Visualized lung apices are unremarkable. Other: None IMPRESSION: CT of the head: Chronic atrophic and ischemic changes without acute abnormality. CT of the cervical spine: Somewhat limited exam due to patient motion artifact. Multilevel degenerative change is seen without acute abnormality. Electronically Signed   By: Inez Catalina M.D.   On: 03/08/2019 21:39   DG Chest Port 1 View  Result Date: 03/08/2019 CLINICAL DATA:  Fall, fever EXAM: PORTABLE CHEST 1 VIEW COMPARISON:  Radiograph August 30, 2013 FINDINGS: Diffuse hazy interstitial opacities throughout both lungs with more patchy opacity in the right lung base and a small right pleural effusion. No pneumothorax. Cardiac silhouette is borderline enlarged. The aorta is calcified and tortuous. The osseous structures appear diffusely demineralized which may limit detection of small or nondisplaced fractures. Degenerative changes are present in the imaged spine and shoulders. Stable dextrocurvature of the thoracic spine. Postsurgical changes in the left axilla. IMPRESSION: Diffuse hazy interstitial opacities throughout both lungs with more patchy opacity in the right lung base and a small right pleural effusion. Findings may represent pulmonary edema and/or multifocal pneumonia. Electronically Signed   By: Lovena Le M.D.   On: 03/08/2019 18:19   ECHOCARDIOGRAM COMPLETE  Result Date: 03/09/2019    ECHOCARDIOGRAM REPORT   Patient Name:   Angela [name] Date of Exam: 03/09/2019 Medical Rec #:  [id number]        Height:       63.0 in Accession #:    [id number]       Weight:       89.9 lb Date of Birth:  07-19-1918        BSA:          1.377 m Patient Age:    83 years          BP:           102/62 mmHg Patient Gender: F                HR:           85 bpm. Exam Location:  Inpatient Procedure: 2D Echo, Color Doppler and Cardiac Doppler Indications:    R06.9 DOE  History:        Patient has no prior history of Echocardiogram examinations.  Risk Factors:Dyslipidemia. Mastectomy.  Sonographer:    Raquel Sarna Senior RDCS Referring Phys: [phone] Jeff  1. Left ventricular ejection fraction, by estimation, is 60 to 65%. The left ventricle has normal function. The left ventricle has no regional wall motion abnormalities. Left ventricular diastolic parameters are consistent with Grade I diastolic dysfunction (impaired relaxation).  2. Right ventricular systolic function is normal. The right ventricular size is normal. There is moderately elevated pulmonary artery systolic pressure.  3. Left atrial size was moderately dilated.  4. Right atrial size was moderately dilated.  5. The mitral valve is normal in structure and function. Trivial mitral valve regurgitation.  6. Tricuspid valve regurgitation is moderate.  7. The aortic valve is tricuspid. Aortic valve regurgitation is trivial. Moderate aortic valve stenosis. FINDINGS  Left Ventricle: Left ventricular ejection fraction, by estimation, is 60 to 65%. The left ventricle has normal function. The left ventricle has no regional wall motion abnormalities. The left ventricular internal cavity size was normal in size. There is  no left ventricular hypertrophy. Left ventricular diastolic parameters are consistent with Grade I diastolic dysfunction (impaired relaxation). Right Ventricle: The right ventricular size is normal. No increase in right ventricular wall thickness. Right ventricular systolic function is normal. There is moderately elevated pulmonary artery systolic pressure. The tricuspid regurgitant velocity is 3.26 m/s, and with an assumed right atrial pressure of 3 mmHg, the estimated right ventricular systolic  pressure is [id number] mmHg. Left Atrium: Left atrial size was moderately dilated. Right Atrium: Right atrial size was moderately dilated. Pericardium: There is no evidence of pericardial effusion. Mitral Valve: The mitral valve is normal in structure and function. There is mild calcification of the mitral valve leaflet(s). Trivial mitral valve regurgitation. Tricuspid Valve: The tricuspid valve is normal in structure. Tricuspid valve regurgitation is moderate. Aortic Valve: The aortic valve is tricuspid. Aortic valve regurgitation is trivial. Moderate aortic stenosis is present. There is moderate calcification of the aortic valve. Aortic valve mean gradient measures 15.0 mmHg. Aortic valve peak gradient measures 25.8 mmHg. Aortic valve area, by VTI measures 0.72 cm. Pulmonic Valve: The pulmonic valve was normal in structure. Pulmonic valve regurgitation is not visualized. Aorta: The aortic root and ascending aorta are structurally normal, with no evidence of dilitation. IAS/Shunts: No atrial level shunt detected by color flow Doppler.  LEFT VENTRICLE PLAX 2D LVIDd:         3.50 cm  Diastology LVIDs:         2.60 cm  LV e' lateral:   3.09 cm/s LV PW:         1.20 cm  LV E/e' lateral: 20.0 LV IVS:        1.00 cm  LV e' medial:    2.47 cm/s LVOT diam:     1.80 cm  LV E/e' medial:  25.0 LV SV:         35 LV SV Index:   25 LVOT Area:     2.54 cm  RIGHT VENTRICLE RV S prime:     8.83 cm/s TAPSE (M-mode): 1.4 cm LEFT ATRIUM             Index       RIGHT ATRIUM           Index LA diam:        3.40 cm 2.47 cm/m  RA Area:     18.50 cm LA Vol (A2C):   66.2 ml 48.08 ml/m RA Volume:   58.40 ml  42.42 ml/m  LA Vol (A4C):   48.0 ml 34.86 ml/m LA Biplane Vol: 56.8 ml 41.25 ml/m  AORTIC VALVE AV Area (Vmax):    0.76 cm AV Area (Vmean):   0.72 cm AV Area (VTI):     0.72 cm AV Vmax:           254.00 cm/s AV Vmean:          185.000 cm/s AV VTI:            0.487 m AV Peak Grad:      25.8 mmHg AV Mean Grad:      15.0 mmHg LVOT Vmax:          75.70 cm/s LVOT Vmean:        52.700 cm/s LVOT VTI:          0.137 m LVOT/AV VTI ratio: 0.28  AORTA Ao Root diam: 2.30 cm Ao Asc diam:  2.30 cm MITRAL VALVE                TRICUSPID VALVE MV Area (PHT): 2.87 cm     TR Peak grad:   42.5 mmHg MV Decel Time: 264 msec     TR Vmax:        326.00 cm/s MV E velocity: 61.70 cm/s MV A velocity: 111.00 cm/s  SHUNTS MV E/A ratio:  0.56         Systemic VTI:  0.14 m                             Systemic Diam: 1.80 cm Glori Bickers MD Electronically signed by Glori Bickers MD Signature Date/Time: 03/09/2019/6:22:33 PM    Final     Scheduled Meds: . citalopram  10 mg Oral Daily  . enoxaparin (LOVENOX) injection  30 mg Subcutaneous Q24H  . levothyroxine  25 mcg Oral Q0600  . loratadine  10 mg Oral Daily  . oxybutynin  5 mg Oral QHS  . Vitamin D (Ergocalciferol)  50,000 Units Oral Q7 days   Continuous Infusions: . azithromycin 500 mg (03/09/19 1135)  . cefTRIAXone (ROCEPHIN)  IV Stopped (03/09/19 2015)     LOS: 2 days   Time spent: 35 minutes.  Patrecia Pour, MD [facility] [email or web address] 03/10/2019, 10:08 AM

## 2019-03-10 NOTE — Progress Notes (Signed)
PHARMACIST - PHYSICIAN COMMUNICATION  CONCERNING: Antibiotic IV to Oral Route Change Policy  RECOMMENDATION: This patient is receiving azithromycin by the intravenous route.  Based on criteria approved by the Pharmacy and Therapeutics Committee, the antibiotic(s) is/are being converted to the equivalent oral dose form(s).   DESCRIPTION: These criteria include:  Patient being treated for a respiratory tract infection, urinary tract infection, cellulitis or clostridium difficile associated diarrhea if on metronidazole  The patient is not neutropenic and does not exhibit a GI malabsorption state  The patient is eating (either orally or via tube) and/or has been taking other orally administered medications for a least 24 hours  The patient is improving clinically and has a Tmax < 100.5  If you have questions about this conversion, please contact the Pharmacy Department  []  ( [phone] )  [place] []  ( [phone]  [facility] []  ( [phone]  [place] []  ( [phone]  [facility] [x]  ( [phone] )  [facility]  

## 2019-03-11 ENCOUNTER — Inpatient Hospital Stay (HOSPITAL_COMMUNITY): Payer: Medicare Other

## 2019-03-11 DIAGNOSIS — R7989 Other specified abnormal findings of blood chemistry: Secondary | ICD-10-CM

## 2019-03-11 LAB — PROCALCITONIN: Procalcitonin: 1.86 ng/mL

## 2019-03-11 LAB — COMPREHENSIVE METABOLIC PANEL
ALT: 19 U/L (ref 0–44)
AST: 36 U/L (ref 15–41)
Albumin: 2.3 g/dL — ABNORMAL LOW (ref 3.5–5.0)
Alkaline Phosphatase: 59 U/L (ref 38–126)
Anion gap: 8 (ref 5–15)
BUN: 23 mg/dL (ref 8–23)
CO2: 23 mmol/L (ref 22–32)
Calcium: 8.1 mg/dL — ABNORMAL LOW (ref 8.9–10.3)
Chloride: 115 mmol/L — ABNORMAL HIGH (ref 98–111)
Creatinine, Ser: 1.03 mg/dL — ABNORMAL HIGH (ref 0.44–1.00)
GFR calc Af Amer: 52 mL/min — ABNORMAL LOW (ref >60)
GFR calc non Af Amer: 45 mL/min — ABNORMAL LOW (ref >60)
Glucose, Bld: 76 mg/dL (ref 70–99)
Potassium: 3.8 mmol/L (ref 3.5–5.1)
Sodium: 146 mmol/L — ABNORMAL HIGH (ref 135–145)
Total Bilirubin: 0.5 mg/dL (ref 0.3–1.2)
Total Protein: 5.8 g/dL — ABNORMAL LOW (ref 6.5–8.1)

## 2019-03-11 LAB — CBC WITH DIFFERENTIAL/PLATELET
Abs Immature Granulocytes: 0.05 10*3/uL (ref 0.00–0.07)
Basophils Absolute: 0 10*3/uL (ref 0.0–0.1)
Basophils Relative: 1 %
Eosinophils Absolute: 0.1 10*3/uL (ref 0.0–0.5)
Eosinophils Relative: 2 %
HCT: 38.2 % (ref 36.0–46.0)
Hemoglobin: 10.8 g/dL — ABNORMAL LOW (ref 12.0–15.0)
Immature Granulocytes: 1 %
Lymphocytes Relative: 16 %
Lymphs Abs: 0.6 10*3/uL — ABNORMAL LOW (ref 0.7–4.0)
MCH: 26.5 pg (ref 26.0–34.0)
MCHC: 28.3 g/dL — ABNORMAL LOW (ref 30.0–36.0)
MCV: 93.9 fL (ref 80.0–100.0)
Monocytes Absolute: 0.7 10*3/uL (ref 0.1–1.0)
Monocytes Relative: 18 %
Neutro Abs: 2.4 10*3/uL (ref 1.7–7.7)
Neutrophils Relative %: 62 %
Platelets: 972 10*3/uL (ref 150–400)
RBC: 4.07 MIL/uL (ref 3.87–5.11)
RDW: 21.3 % — ABNORMAL HIGH (ref 11.5–15.5)
WBC: 3.9 10*3/uL — ABNORMAL LOW (ref 4.0–10.5)
nRBC: 0.5 % — ABNORMAL HIGH (ref 0.0–0.2)

## 2019-03-11 LAB — MAGNESIUM: Magnesium: 2.2 mg/dL (ref 1.7–2.4)

## 2019-03-11 LAB — C-REACTIVE PROTEIN: CRP: 16.9 mg/dL — ABNORMAL HIGH (ref <1.0)

## 2019-03-11 LAB — D-DIMER, QUANTITATIVE: D-Dimer, Quant: 2.51 ug/mL-FEU — ABNORMAL HIGH (ref 0.00–0.50)

## 2019-03-11 LAB — PHOSPHORUS: Phosphorus: 3.1 mg/dL (ref 2.5–4.6)

## 2019-03-11 MED ORDER — METOPROLOL TARTRATE 5 MG/5ML IV SOLN
5.0000 mg | Freq: Once | INTRAVENOUS | Status: AC
  Administered 2019-03-11: 5 mg via INTRAVENOUS
  Filled 2019-03-11: qty 5

## 2019-03-11 MED ORDER — METOPROLOL TARTRATE 25 MG PO TABS
25.0000 mg | ORAL_TABLET | Freq: Two times a day (BID) | ORAL | Status: DC
  Administered 2019-03-12 – 2019-03-14 (×5): 25 mg via ORAL
  Filled 2019-03-11 (×7): qty 1

## 2019-03-11 MED ORDER — METOPROLOL TARTRATE 5 MG/5ML IV SOLN
5.0000 mg | INTRAVENOUS | Status: AC | PRN
  Administered 2019-03-11 – 2019-03-12 (×2): 5 mg via INTRAVENOUS
  Filled 2019-03-11 (×2): qty 5

## 2019-03-11 MED ORDER — DIGOXIN 0.25 MG/ML IJ SOLN
0.1250 mg | Freq: Once | INTRAMUSCULAR | Status: DC
  Filled 2019-03-11: qty 0.5

## 2019-03-11 MED ORDER — SALINE SPRAY 0.65 % NA SOLN
1.0000 | NASAL | Status: DC | PRN
  Filled 2019-03-11: qty 44

## 2019-03-11 MED ORDER — MAGNESIUM SULFATE 2 GM/50ML IV SOLN
2.0000 g | Freq: Once | INTRAVENOUS | Status: AC
  Administered 2019-03-11: 2 g via INTRAVENOUS
  Filled 2019-03-11: qty 50

## 2019-03-11 NOTE — Progress Notes (Signed)
   03/11/19 0818  Vitals  BP 116/90  MAP (mmHg) 99  BP Location Left Arm  BP Method Automatic  Patient Position (if appropriate) Lying  Pulse Rate (!) 143  Pulse Rate Source Monitor  Resp (!) 28  Oxygen Therapy  SpO2 92 %  O2 Device Room Air  MEWS Score  MEWS Temp 0  MEWS Systolic 0  MEWS Pulse 3  MEWS RR 2  MEWS LOC 0  MEWS Score 5  MEWS Score Color Red  Md notified of increased heart rate

## 2019-03-11 NOTE — Progress Notes (Signed)
   03/11/19 0935  Vitals  Temp 98 F (36.7 C)  Temp Source Axillary  BP 93/80  MAP (mmHg) 86  BP Location Left Arm  BP Method Automatic  Patient Position (if appropriate) Sitting  Oxygen Therapy  SpO2 93 %  O2 Device Nasal Cannula  O2 Flow Rate (L/min) 1 L/min  Patient Activity (if Appropriate) In bed  MEWS Score  MEWS Temp 0  MEWS Systolic 1  MEWS Pulse 3  MEWS RR 2  MEWS LOC 0  MEWS Score 6  MEWS Score Color Red   CN notified of pt HR in the 140's- hr SVT. MD was notified of change in status. Metoprolol 5 mg was given. During this time MEWS turned red. MD aware and RRN notified of status change. HR continues to be in 120's- order for additional 5 mg given. RN closely monitoring pt.

## 2019-03-11 NOTE — Progress Notes (Signed)
Patient's heart rate noted to be steadily increasing in the 120's and pt c/o "can't catch my breath". O2 sat is 95% on room air. Md notified and orders received.Eulas Post, RN

## 2019-03-11 NOTE — Progress Notes (Signed)
Patient refused oral Metoprolol. It was given IV per order for sustained heart rate >120 and MAP of 83 Eulas Post, RN

## 2019-03-11 NOTE — Progress Notes (Signed)
PROGRESS NOTE  Angela [name]  [id number] DOB: 05-03-1918 DOA: 03/08/2019 PCP: [name], MD   Brief Narrative: Angela [name] is a 86yo female with a history of remote breast CA s/p mastectomy, RA, anxiety, and depression with recent decreased functional independence who was brought by EMS from home on 3/4 due to increasing weakness, inability to transfer to wheelchair per usual as well as pain all over. In the ED, Temperature was 100.57F, WBC wnl, UA revealed pyuria and many bacteria, CXR demonstrated bilateral hazy opacities with patchy infiltrates as well. SARS-CoV-2 Ag and subsequent PCR were both negative as was influenza testing. Ceftriaxone and azithromycin were administered, supplemental oxygen given, and admission requested. BNP elevated with uncertain significance.   Assessment & Plan: Principal Problem:   Multifocal pneumonia Active Problems:   Rheumatoid arthritis ([place])   Generalized weakness   Acute lower UTI   Pressure injury of skin  Advanced age, debility: I've discussed goals of care with the patient and her son, with whom she lives. They agree certainly to DNR status along with full measures at this time. Poor overall prognosis regarding longevity as well as debility inherent in this patient was also reviewed.  Multifocal pneumonia: With significant elevation in both CRP and PCT more suggestive of bacterial component.  - Initially noted to have mildly elevated AST, has ongoing lymphopenia without neutrophilia. In constellation with CXR findings and elevated inflammatory markers including elevated d-dimer, suspicion for covid-19 remains on differential. Thrombocytosis argues for bacterial infection however. Urinalysis appears infected with polyclonal growth on culture  - Will continue antibiotics.   Hypernatremia:  - Improved with hypotonic IVF, still very little po intake so will continue IVF.   Severe protein calorie malnutrition: Diffuse cachectic findings on  exam, BMI is 15.  - Supplement as able  Hypoglycemia:  - Improved with dextrose-containing IVF.  Leg swelling: Possibly related to hypoalbuminemia due to nutritional deficiency. No DVT on LE venous U/S.  - D-dimer elevated with LE edema, will check venous U/S. - Still awaiting sample to check spot urine sample to roughly quantify proteinuria.   Thrombocytosis: Suspected to be reactive.  - Continue monitoring.   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 D/w cardiology. Has been struggling with elevated rate today but improved with metoprolol. Fortunately did not require digoxin which is less favored by risk profile. Diltiazem likely an option as well given preserved EF on last check.  - Start metoprolol po and continue IV prn.  - Supplement magnesium empirically, check in AM w/renal panel.  - Would not be candidate for ablation. Hope for overall improvement as underlying conditions are treated.  AKI on CKD NOS: Unclear renal function at baseline, though minimal elevation of creatinine in the setting of sarcopenia tends to underestimate renal impairment.  - Avoid nephrotoxins - Started low volume of IV fluid. Note BNP is elevated and there is some edema, though no severe heart failure noted on echo, LVEF 60-65% and edema likely nutritionally (oncotically) mediated. CXR of concern for increasing vascularity, though pt's renal failure, and minimal po intake argue for continuing IVF for right now.   DVT prophylaxis: Heparin [place] due to CrCl ~15ml/min and worsening and weight only 40kg. Code Status: DNR confirmed Family Communication: None at bedside Disposition Plan: Uncertain, pending clinical progress. Remains ill with high risk of life-ending deterioration.   Consultants:  Palliative care medicine team  Procedures:   Echocardiogram 3/5:  1. Left ventricular ejection fraction, by estimation, is 60 to 65%. The  left ventricle has normal function. The left ventricle has no regional  wall motion  abnormalities. Left ventricular diastolic parameters are  consistent with Grade I diastolic  dysfunction (impaired relaxation).  2. Right ventricular systolic function is normal. The right ventricular  size is normal. There is moderately elevated pulmonary artery systolic  pressure.  3. Left atrial size was moderately dilated.  4. Right atrial size was moderately dilated.  5. The mitral valve is normal in structure and function. Trivial mitral  valve regurgitation.  6. Tricuspid valve regurgitation is moderate.  7. The aortic valve is tricuspid. Aortic valve regurgitation is trivial.  Moderate aortic valve stenosis.   Antimicrobials:  Ceftriaxone, azithromycin   Subjective: Without complaint this morning. Hasn't eaten breakfast. When HR rising, pt has no palpitations or chest pain. Is having intermittent dyspnea despite no hypoxemia.   Objective: Vitals:   03/11/19 1616 03/11/19 1629 03/11/19 1800 03/11/19 1815  BP: 112/79 97/81 129/76 138/75  Pulse: (!) 126     Resp:    20  Temp:      TempSrc:      SpO2:  95% 98%   Weight:      Height:        Intake/Output Summary (Last 24 hours) at 03/11/2019 1829 Last data filed at 03/11/2019 0300 Gross per 24 hour  Intake 604.17 ml  Output 0 ml  Net 604.17 ml   Filed Weights   03/08/19 2200  Weight: 40.8 kg   Gen: 86 y.o. female in no distress Pulm: Nonlabored breathing, NO crackles. CV: Regular tachycardia. No murmur, rub, or gallop. No JVD, stable dependent edema. GI: Abdomen soft, non-tender, non-distended, with normoactive bowel sounds.  Ext: Warm, no deformities Skin: No rashes, lesions or ulcers on visualized skin. Neuro: Alert and incompletely oriented. No focal neurological deficits. Psych: Judgement and insight appear fair. Mood euthymic & affect congruent. Behavior is appropriate.    Data Reviewed: I have personally reviewed following labs and imaging studies  CBC: Recent Labs  Lab 03/08/19 1750  03/09/19 0444 03/10/19 0343 03/11/19 0350  WBC 5.6 5.8 4.0 3.9*  NEUTROABS 4.0 3.6 2.7 2.4  HGB 13.8 11.7* 10.8* 10.8*  HCT 46.5* 40.2 37.6 38.2  MCV 89.9 90.7 93.5 93.9  PLT 899* 727* 765* 123456*   Basic Metabolic Panel: Recent Labs  Lab 03/08/19 1750 03/09/19 0444 03/10/19 0343 03/11/19 0350  NA 144 145 149* 146*  K 3.7 3.0* 3.9 3.8  CL 110 114* 118* 115*  CO2 24 22 23 23   GLUCOSE 95 71 65* 76  BUN 15 16 24* 23  CREATININE 0.98 0.80 1.14* 1.03*  CALCIUM 8.3* 8.0* 8.0* 8.1*  MG  --   --  2.1 2.2  PHOS  --   --   --  3.1   GFR: Estimated Creatinine Clearance: 19.2 mL/min (A) (by C-G formula based on SCr of 1.03 mg/dL (H)). Liver Function Tests: Recent Labs  Lab 03/08/19 1750 03/09/19 0444 03/10/19 0343 03/11/19 0350  AST 42* 31 33 36  ALT 17 16 18 19   ALKPHOS 78 62 62 59  BILITOT 1.3* 1.0 0.7 0.5  PROT 6.8 5.6* 5.4* 5.8*  ALBUMIN 2.7* 2.3* 2.1* 2.3*   No results for input(s): LIPASE, AMYLASE in the last 168 hours. No results for input(s): AMMONIA in the last 168 hours. Coagulation Profile: Recent Labs  Lab 03/08/19 1810  INR 1.4*   Cardiac Enzymes: No results for input(s): CKTOTAL, CKMB, CKMBINDEX, TROPONINI in the last 168 hours. BNP (last  3 results) No results for input(s): PROBNP in the last 8760 hours. HbA1C: No results for input(s): HGBA1C in the last 72 hours. CBG: No results for input(s): GLUCAP in the last 168 hours. Lipid Profile: No results for input(s): CHOL, HDL, LDLCALC, TRIG, CHOLHDL, LDLDIRECT in the last 72 hours. Thyroid Function Tests: Recent Labs    03/08/19 1948  TSH 4.063   Anemia Panel: No results for input(s): VITAMINB12, FOLATE, FERRITIN, TIBC, IRON, RETICCTPCT in the last 72 hours. Urine analysis:    Component Value Date/Time   COLORURINE YELLOW 03/08/2019 1724   APPEARANCEUR HAZY (A) 03/08/2019 1724   LABSPEC 1.016 03/08/2019 1724   PHURINE 6.0 03/08/2019 1724   GLUCOSEU NEGATIVE 03/08/2019 1724   GLUCOSEU NEGATIVE  01/19/2017 1346   HGBUR SMALL (A) 03/08/2019 1724   BILIRUBINUR NEGATIVE 03/08/2019 1724   BILIRUBINUR neg 03/09/2013 1447   KETONESUR NEGATIVE 03/08/2019 1724   PROTEINUR >=300 (A) 03/08/2019 1724   UROBILINOGEN 0.2 01/19/2017 1346   NITRITE NEGATIVE 03/08/2019 1724   LEUKOCYTESUR NEGATIVE 03/08/2019 1724   Recent Results (from the past 240 hour(s))  Urine culture     Status: Abnormal   Collection Time: 03/08/19  5:24 PM   Specimen: In/Out Cath Urine  Result Value Ref Range Status   Specimen Description   Final    IN/OUT CATH URINE Performed at [facility], [place] [address] [place], [place] [address]    Special Requests   Final    NONE Performed at [facility], [place] [address], [place], [place] [address]    Culture MULTIPLE SPECIES PRESENT, SUGGEST RECOLLECTION (A)  Final   Report Status 03/09/2019 FINAL  Final  Blood Culture (routine x 2)     Status: None (Preliminary result)   Collection Time: 03/08/19  5:50 PM   Specimen: BLOOD RIGHT FOREARM  Result Value Ref Range Status   Specimen Description   Final    BLOOD RIGHT FOREARM Performed at [facility] [address] [place], [place] [address]    Special Requests   Final    BOTTLES DRAWN AEROBIC AND ANAEROBIC Blood Culture results may not be optimal due to an inadequate volume of blood received in culture bottles Performed at [facility] [address], [place], [place] [address]    Culture   Final    NO GROWTH 3 DAYS Performed at [facility], [place] [address], [place], [place] [address]    Report Status PENDING  Incomplete  Blood Culture (routine x 2)     Status: None (Preliminary result)   Collection Time: 03/08/19  6:10 PM   Specimen: BLOOD  Result Value Ref Range Status   Specimen Description   Final    BLOOD RIGHT ANTECUBITAL Performed at [place] [address], [place], [place] [address]    Special  Requests   Final    BOTTLES DRAWN AEROBIC AND ANAEROBIC Blood Culture adequate volume Performed at [facility] [address], [place], [place] [address]    Culture   Final    NO GROWTH 3 DAYS Performed at [facility], [place] [address], [place], [place] [address]    Report Status PENDING  Incomplete  Respiratory Panel by RT PCR (Flu A&B, Covid) - Nasopharyngeal Swab     Status: None   Collection Time: 03/08/19  7:16 PM   Specimen: Nasopharyngeal Swab  Result Value Ref Range Status   SARS Coronavirus 2 by  RT PCR NEGATIVE NEGATIVE Final    Comment: (NOTE) SARS-CoV-2 target nucleic acids are NOT DETECTED. The SARS-CoV-2 RNA is generally detectable in upper respiratoy specimens during the acute phase of infection. The lowest concentration of SARS-CoV-2 viral copies this assay can detect is 131 copies/mL. A negative result does not preclude SARS-Cov-2 infection and should not be used as the sole basis for treatment or other patient management decisions. A negative result may occur with  improper specimen collection/handling, submission of specimen other than nasopharyngeal swab, presence of viral mutation(s) within the areas targeted by this assay, and inadequate number of viral copies (<131 copies/mL). A negative result must be combined with clinical observations, patient history, and epidemiological information. The expected result is Negative. Fact Sheet for Patients:  PinkCheek.be Fact Sheet for Healthcare Providers:  GravelBags.it This test is not yet ap proved or cleared by the [place] FDA and  has been authorized for detection and/or diagnosis of SARS-CoV-2 by FDA under an Emergency Use Authorization (EUA). This EUA will remain  in effect (meaning this test can be used) for the duration of the COVID-19 declaration under Section 564(b)(1) of the Act, 21 U.S.C. section 360bbb-3(b)(1), unless  the authorization is terminated or revoked sooner.    Influenza A by PCR NEGATIVE NEGATIVE Final   Influenza B by PCR NEGATIVE NEGATIVE Final    Comment: (NOTE) The Xpert Xpress SARS-CoV-2/FLU/RSV assay is intended as an aid in  the diagnosis of influenza from Nasopharyngeal swab specimens and  should not be used as a sole basis for treatment. Nasal washings and  aspirates are unacceptable for Xpert Xpress SARS-CoV-2/FLU/RSV  testing. Fact Sheet for Patients: PinkCheek.be Fact Sheet for Healthcare Providers: GravelBags.it This test is not yet approved or cleared by the [place] FDA and  has been authorized for detection and/or diagnosis of SARS-CoV-2 by  FDA under an Emergency Use Authorization (EUA). This EUA will remain  in effect (meaning this test can be used) for the duration of the  Covid-19 declaration under Section 564(b)(1) of the Act, 21  U.S.C. section 360bbb-3(b)(1), unless the authorization is  terminated or revoked. Performed at [facility], [place] [address] [place], [place] [address]       Radiology Studies: DG CHEST PORT 1 VIEW  Result Date: 03/11/2019 CLINICAL DATA:  86 year old female with history of shortness of breath. EXAM: PORTABLE CHEST 1 VIEW COMPARISON:  Chest x-ray 03/08/2019. FINDINGS: There is cephalization of the pulmonary vasculature and slight indistinctness of the interstitial markings suggestive of mild pulmonary edema. Trace right pleural effusion. Mild cardiomegaly. The patient is rotated to the left on today's exam, resulting in distortion of the mediastinal contours and reduced diagnostic sensitivity and specificity for mediastinal pathology. Aortic atherosclerosis. Surgical clips projecting over the left axillary region, likely from prior lymph node dissection. IMPRESSION: 1. The appearance of the chest is most compatible with mild congestive heart failure, as above. 2.  Aortic atherosclerosis. Electronically Signed   By: Vinnie Langton M.D.   On: 03/11/2019 04:50   VAS [place] LOWER EXTREMITY VENOUS (DVT)  Result Date: 03/11/2019  Lower Venous DVTStudy Indications: Elevated Ddimer.  Risk Factors: None identified. Limitations: Patient positioning, patient immobility, patient pain tolerance. Comparison Study: No prior studies. Performing Technologist: Oliver Hum RVT  Examination Guidelines: A complete evaluation includes B-mode imaging, spectral Doppler, color Doppler, and power Doppler as needed of all accessible portions of each vessel. Bilateral testing is considered an integral part of a complete examination. Limited examinations for reoccurring  indications may be performed as noted. The reflux portion of the exam is performed with the patient in reverse Trendelenburg.  +---------+---------------+---------+-----------+----------+--------------+ RIGHT    CompressibilityPhasicitySpontaneityPropertiesThrombus Aging +---------+---------------+---------+-----------+----------+--------------+ CFV      Full           Yes      Yes                                 +---------+---------------+---------+-----------+----------+--------------+ SFJ      Full                                                        +---------+---------------+---------+-----------+----------+--------------+ FV Prox  Full                                                        +---------+---------------+---------+-----------+----------+--------------+ FV Mid   Full                                                        +---------+---------------+---------+-----------+----------+--------------+ FV DistalFull                                                        +---------+---------------+---------+-----------+----------+--------------+ PFV      Full                                                         +---------+---------------+---------+-----------+----------+--------------+ POP      Full           Yes      Yes                                 +---------+---------------+---------+-----------+----------+--------------+ PTV      Full                                                        +---------+---------------+---------+-----------+----------+--------------+ PERO     Full                                                        +---------+---------------+---------+-----------+----------+--------------+   +---------+---------------+---------+-----------+----------+--------------+ LEFT     CompressibilityPhasicitySpontaneityPropertiesThrombus Aging +---------+---------------+---------+-----------+----------+--------------+ CFV      Full  Yes      Yes                                 +---------+---------------+---------+-----------+----------+--------------+ SFJ      Full                                                        +---------+---------------+---------+-----------+----------+--------------+ FV Prox  Full                                                        +---------+---------------+---------+-----------+----------+--------------+ FV Mid   Full                                                        +---------+---------------+---------+-----------+----------+--------------+ FV DistalFull                                                        +---------+---------------+---------+-----------+----------+--------------+ PFV      Full                                                        +---------+---------------+---------+-----------+----------+--------------+ POP      Full           Yes      Yes                                 +---------+---------------+---------+-----------+----------+--------------+ PTV      Full                                                         +---------+---------------+---------+-----------+----------+--------------+ PERO     Full                                                        +---------+---------------+---------+-----------+----------+--------------+     Summary: RIGHT: - There is no evidence of deep vein thrombosis in the lower extremity.  - No cystic structure found in the popliteal fossa.  LEFT: - There is no evidence of deep vein thrombosis in the lower extremity.  - No cystic structure found in the popliteal fossa.  *See table(s) above for measurements and observations. Electronically signed by Monica Martinez MD on 03/11/2019 at 11:06:42  AM.    Final     Scheduled Meds: . azithromycin  500 mg Oral QHS  . citalopram  10 mg Oral Daily  . heparin injection (subcutaneous)  5,000 Units Subcutaneous Q8H  . levothyroxine  25 mcg Oral Q0600  . loratadine  10 mg Oral Daily  . metoprolol tartrate  25 mg Oral BID  . oxybutynin  5 mg Oral QHS  . Vitamin D (Ergocalciferol)  50,000 Units Oral Q7 days   Continuous Infusions: . cefTRIAXone (ROCEPHIN)  IV 1 g (03/11/19 1814)  . dextrose 5 % and 0.45% NaCl 75 mL/hr at 03/11/19 0904     LOS: 3 days   Time spent: 35 minutes.  Patrecia Pour, MD [facility] [email or web address] 03/11/2019, 6:29 PM

## 2019-03-11 NOTE — Progress Notes (Signed)
PT Cancellation Note  Patient Details Name: CEANNA MATECKI MRN: [id number] DOB: [birth year]   Cancelled Treatment:    Reason Eval/Treat Not Completed: Medical issues which prohibited therapy- Per RN, having issues with HR and asked that PT be on HOLD at least for today.  Rollen Sox, PT # [phone] CGV cell  Casandra Doffing 03/11/2019, 10:47 AM

## 2019-03-11 NOTE — Progress Notes (Signed)
Bilateral lower extremity venous duplex has been completed. Preliminary results can be found in CV Proc through chart review.   03/11/19 9:23 AM Angela [name] RVT

## 2019-03-11 NOTE — Progress Notes (Signed)
CRITICAL VALUE ALERT  Critical Value:  Platelets 972,000  Date & Time Notied:  0520  Provider Notified: Kennon Holter, NP  Orders Received/Actions taken: No new orders

## 2019-03-11 NOTE — Progress Notes (Signed)
Central tele notified RN that pt is in SVT heart rate in 140's. VSS. Pt not c/o SOB, chest pain or any sensation of heart racing.  EKG done. Md notified. Awaiting orders.Eulas Post, RN

## 2019-03-12 LAB — CBC WITH DIFFERENTIAL/PLATELET
Abs Immature Granulocytes: 0.04 10*3/uL (ref 0.00–0.07)
Basophils Absolute: 0.1 10*3/uL (ref 0.0–0.1)
Basophils Relative: 1 %
Eosinophils Absolute: 0.1 10*3/uL (ref 0.0–0.5)
Eosinophils Relative: 3 %
HCT: 38.9 % (ref 36.0–46.0)
Hemoglobin: 11.1 g/dL — ABNORMAL LOW (ref 12.0–15.0)
Immature Granulocytes: 1 %
Lymphocytes Relative: 16 %
Lymphs Abs: 0.8 10*3/uL (ref 0.7–4.0)
MCH: 26.8 pg (ref 26.0–34.0)
MCHC: 28.5 g/dL — ABNORMAL LOW (ref 30.0–36.0)
MCV: 94 fL (ref 80.0–100.0)
Monocytes Absolute: 1.3 10*3/uL — ABNORMAL HIGH (ref 0.1–1.0)
Monocytes Relative: 28 %
Neutro Abs: 2.4 10*3/uL (ref 1.7–7.7)
Neutrophils Relative %: 51 %
Platelets: 1179 10*3/uL (ref 150–400)
RBC: 4.14 MIL/uL (ref 3.87–5.11)
RDW: 21.6 % — ABNORMAL HIGH (ref 11.5–15.5)
WBC: 4.6 10*3/uL (ref 4.0–10.5)
nRBC: 0 % (ref 0.0–0.2)

## 2019-03-12 LAB — COMPREHENSIVE METABOLIC PANEL
ALT: 19 U/L (ref 0–44)
AST: 32 U/L (ref 15–41)
Albumin: 2.2 g/dL — ABNORMAL LOW (ref 3.5–5.0)
Alkaline Phosphatase: 64 U/L (ref 38–126)
Anion gap: 9 (ref 5–15)
BUN: 20 mg/dL (ref 8–23)
CO2: 22 mmol/L (ref 22–32)
Calcium: 8.2 mg/dL — ABNORMAL LOW (ref 8.9–10.3)
Chloride: 115 mmol/L — ABNORMAL HIGH (ref 98–111)
Creatinine, Ser: 1 mg/dL (ref 0.44–1.00)
GFR calc Af Amer: 54 mL/min — ABNORMAL LOW (ref >60)
GFR calc non Af Amer: 47 mL/min — ABNORMAL LOW (ref >60)
Glucose, Bld: 137 mg/dL — ABNORMAL HIGH (ref 70–99)
Potassium: 4.2 mmol/L (ref 3.5–5.1)
Sodium: 146 mmol/L — ABNORMAL HIGH (ref 135–145)
Total Bilirubin: 0.2 mg/dL — ABNORMAL LOW (ref 0.3–1.2)
Total Protein: 5.7 g/dL — ABNORMAL LOW (ref 6.5–8.1)

## 2019-03-12 LAB — C-REACTIVE PROTEIN: CRP: 12 mg/dL — ABNORMAL HIGH (ref <1.0)

## 2019-03-12 LAB — IRON AND TIBC
Iron: 33 ug/dL (ref 28–170)
Saturation Ratios: 22 % (ref 10.4–31.8)
TIBC: 149 ug/dL — ABNORMAL LOW (ref 250–450)
UIBC: 116 ug/dL

## 2019-03-12 LAB — MAGNESIUM: Magnesium: 2.6 mg/dL — ABNORMAL HIGH (ref 1.7–2.4)

## 2019-03-12 LAB — PROTEIN / CREATININE RATIO, URINE
Creatinine, Urine: 153.53 mg/dL
Protein Creatinine Ratio: 1.41 mg/mg{Cre} — ABNORMAL HIGH (ref 0.00–0.15)
Total Protein, Urine: 217 mg/dL

## 2019-03-12 LAB — VITAMIN B12: Vitamin B-12: 1480 pg/mL — ABNORMAL HIGH (ref 180–914)

## 2019-03-12 LAB — FERRITIN: Ferritin: 185 ng/mL (ref 11–307)

## 2019-03-12 LAB — PATHOLOGIST SMEAR REVIEW

## 2019-03-12 LAB — SODIUM, URINE, RANDOM: Sodium, Ur: 31 mmol/L

## 2019-03-12 LAB — FOLATE: Folate: 10.2 ng/mL (ref >5.9)

## 2019-03-12 LAB — PROCALCITONIN: Procalcitonin: 1.29 ng/mL

## 2019-03-12 MED ORDER — METOPROLOL TARTRATE 5 MG/5ML IV SOLN: 5.0000 mg | INTRAVENOUS | Status: DC | PRN

## 2019-03-12 NOTE — Care Management Important Message (Signed)
Important Message  Patient Details IM Letter given to Gabriel Earing RN Case Manager to present to the Patient Name: SPARKLES MOHRING MRN: [id number] Date of Birth: 1918-09-12   Medicare Important Message Given:  Yes     Kerin Salen 03/12/2019, 11:03 AM

## 2019-03-12 NOTE — Progress Notes (Signed)
PROGRESS NOTE  Angela [name]  [id number] DOB: 1918/10/24 DOA: 03/08/2019 PCP: [name], MD   Brief Narrative: Angela [name] is a 86yo female with a history of remote breast CA s/p mastectomy, RA, anxiety, and depression with recent decreased functional independence who was brought by EMS from home on 3/4 due to increasing weakness, inability to transfer to wheelchair per usual as well as pain all over. In the ED, Temperature was 100.87F, WBC wnl, UA revealed pyuria and many bacteria, CXR demonstrated bilateral hazy opacities with patchy infiltrates as well. SARS-CoV-2 Ag and subsequent PCR were both negative as was influenza testing. Ceftriaxone and azithromycin were administered, supplemental oxygen given, and admission requested. PO intake poor and IVF given.   Assessment & Plan: Principal Problem:   Multifocal pneumonia Active Problems:   Rheumatoid arthritis ([place])   Generalized weakness   Acute lower UTI   Pressure injury of skin  Advanced age, debility: I've discussed goals of care with the patient and her son, with whom she lives. They agree certainly to DNR status along with full measures at this time. Poor overall prognosis regarding longevity as well as debility inherent in this patient was also reviewed.  Multifocal pneumonia: With significant elevation in both CRP and PCT more suggestive of bacterial component. - Initially noted to have mildly elevated AST, has ongoing lymphopenia without neutrophilia. In constellation with CXR findings and elevated inflammatory markers including elevated d-dimer, suspicion for covid-19 remains on differential. Thrombocytosis argues for bacterial infection however. Urinalysis appears infected with polyclonal growth on culture  - Will continue antibiotics.   Hypernatremia:  - Improved with hypotonic IVF. To avoid iatrogenic overload, stop IVF, allow diet ad lib.  Severe protein calorie malnutrition: Diffuse cachectic findings on exam,  BMI is 15.  - Supplement as able  Hypoglycemia:  - Improved with dextrose-containing IVF.  Leg swelling: Possibly related to hypoalbuminemia due to nutritional deficiency. No DVT on LE venous U/S.  - Still awaiting sample to check spot urine sample to roughly quantify proteinuria.   Thrombocytosis: Suspected to be reactive, though has had elevated platelets in the past. Also had hematology evaluation for neutropenia and leukopenia in 2016 though work up was deferred due to asymptomatic without recurrent infections and poor functional status..  - Continue monitoring.   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 D/w cardiology. Has been struggling with elevated rate today but improved with metoprolol. Fortunately did not require digoxin which is less favored by risk profile. Diltiazem likely an option as well given preserved EF on last check.  - Started metoprolol po and will continue IV prn. This recurred today but terminated. - Mg 2.6.  - Would not be candidate for ablation. Hope for overall improvement as underlying conditions are treated.  AKI on CKD NOS: Unclear renal function at baseline, though minimal elevation of creatinine in the setting of sarcopenia tends to underestimate renal impairment.  - Avoid nephrotoxins - Stop IVF with picking up po intake. Note BNP is elevated and there is some edema, though no severe heart failure noted on echo, LVEF 60-65% and edema likely nutritionally (oncotically) mediated.   DVT prophylaxis: Heparin [place] due to CrCl ~36ml/min and worsening and weight only 40kg. Code Status: DNR confirmed Family Communication: None at bedside this AM Disposition Plan: Uncertain, pending clinical progress. Remains ill with high risk of life-ending deterioration.   Consultants:  Palliative care medicine team  Procedures:   Echocardiogram 3/5:  1. Left ventricular ejection fraction, by estimation, is 60  to 65%. The  left ventricle has normal function. The left ventricle has  no regional  wall motion abnormalities. Left ventricular diastolic parameters are  consistent with Grade I diastolic  dysfunction (impaired relaxation).  2. Right ventricular systolic function is normal. The right ventricular  size is normal. There is moderately elevated pulmonary artery systolic  pressure.  3. Left atrial size was moderately dilated.  4. Right atrial size was moderately dilated.  5. The mitral valve is normal in structure and function. Trivial mitral  valve regurgitation.  6. Tricuspid valve regurgitation is moderate.  7. The aortic valve is tricuspid. Aortic valve regurgitation is trivial.  Moderate aortic valve stenosis.   Antimicrobials:  Ceftriaxone, azithromycin   Subjective: Eating better, asking for peanut butter consistently. Eating w/assistance. Feels some short of breath improved with supplemental oxygen. No cough or fever reported. Her legs and feet bother her chronically.  Objective: Vitals:   03/12/19 0405 03/12/19 0831 03/12/19 1526 03/12/19 1528  BP: 100/72   94/63  Pulse:  77 (!) 110 71  Resp:    20  Temp:    97.7 F (36.5 C)  TempSrc:    Oral  SpO2:   95% 91%  Weight:      Height:        Intake/Output Summary (Last 24 hours) at 03/12/2019 1551 Last data filed at 03/12/2019 0929 Gross per 24 hour  Intake 1517.04 ml  Output --  Net 1517.04 ml   Filed Weights   03/08/19 2200  Weight: 40.8 kg   Gen: Thin elderly female in no distress Pulm: Nonlabored, no focal crackles or wheezes. CV: Regular rate and rhythm. No murmur, rub, or gallop. No JVD, 1+ pitting nondependent edema. GI: Abdomen soft, non-tender, non-distended, with normoactive bowel sounds.  Ext: Warm, no deformities Skin: No new rashes, lesions or ulcers on visualized skin. Neuro: Alert and oriented. No focal neurological deficits. Psych: Judgement and insight appear fair. Mood euthymic & affect congruent. Behavior is appropriate.    Data Reviewed: I have personally  reviewed following labs and imaging studies  CBC: Recent Labs  Lab 03/08/19 1750 03/09/19 0444 03/10/19 0343 03/11/19 0350 03/12/19 0423  WBC 5.6 5.8 4.0 3.9* 4.6  NEUTROABS 4.0 3.6 2.7 2.4 2.4  HGB 13.8 11.7* 10.8* 10.8* 11.1*  HCT 46.5* 40.2 37.6 38.2 38.9  MCV 89.9 90.7 93.5 93.9 94.0  PLT 899* 727* 765* 972* [id number]*   Basic Metabolic Panel: Recent Labs  Lab 03/08/19 1750 03/09/19 0444 03/10/19 0343 03/11/19 0350 03/12/19 0423  NA 144 145 149* 146* 146*  K 3.7 3.0* 3.9 3.8 4.2  CL 110 114* 118* 115* 115*  CO2 24 22 23 23 22   GLUCOSE 95 71 65* 76 137*  BUN 15 16 24* 23 20  CREATININE 0.98 0.80 1.14* 1.03* 1.00  CALCIUM 8.3* 8.0* 8.0* 8.1* 8.2*  MG  --   --  2.1 2.2 2.6*  PHOS  --   --   --  3.1  --    GFR: Estimated Creatinine Clearance: 19.7 mL/min (by C-G formula based on SCr of 1 mg/dL). Liver Function Tests: Recent Labs  Lab 03/08/19 1750 03/09/19 0444 03/10/19 0343 03/11/19 0350 03/12/19 0423  AST 42* 31 33 36 32  ALT 17 16 18 19 19   ALKPHOS 78 62 62 59 64  BILITOT 1.3* 1.0 0.7 0.5 0.2*  PROT 6.8 5.6* 5.4* 5.8* 5.7*  ALBUMIN 2.7* 2.3* 2.1* 2.3* 2.2*   No results for input(s): LIPASE, AMYLASE in  the last 168 hours. No results for input(s): AMMONIA in the last 168 hours. Coagulation Profile: Recent Labs  Lab 03/08/19 1810  INR 1.4*   Cardiac Enzymes: No results for input(s): CKTOTAL, CKMB, CKMBINDEX, TROPONINI in the last 168 hours. BNP (last 3 results) No results for input(s): PROBNP in the last 8760 hours. HbA1C: No results for input(s): HGBA1C in the last 72 hours. CBG: No results for input(s): GLUCAP in the last 168 hours. Lipid Profile: No results for input(s): CHOL, HDL, LDLCALC, TRIG, CHOLHDL, LDLDIRECT in the last 72 hours. Thyroid Function Tests: No results for input(s): TSH, T4TOTAL, FREET4, T3FREE, THYROIDAB in the last 72 hours. Anemia Panel: Recent Labs    03/12/19 0423  VITAMINB12 1,480*  FOLATE 10.2  FERRITIN 185  TIBC  149*  IRON 33   Urine analysis:    Component Value Date/Time   COLORURINE YELLOW 03/08/2019 1724   APPEARANCEUR HAZY (A) 03/08/2019 1724   LABSPEC 1.016 03/08/2019 1724   PHURINE 6.0 03/08/2019 1724   GLUCOSEU NEGATIVE 03/08/2019 1724   GLUCOSEU NEGATIVE 01/19/2017 1346   HGBUR SMALL (A) 03/08/2019 1724   BILIRUBINUR NEGATIVE 03/08/2019 1724   BILIRUBINUR neg 03/09/2013 1447   KETONESUR NEGATIVE 03/08/2019 1724   PROTEINUR >=300 (A) 03/08/2019 1724   UROBILINOGEN 0.2 01/19/2017 1346   NITRITE NEGATIVE 03/08/2019 1724   LEUKOCYTESUR NEGATIVE 03/08/2019 1724   Recent Results (from the past 240 hour(s))  Urine culture     Status: Abnormal   Collection Time: 03/08/19  5:24 PM   Specimen: In/Out Cath Urine  Result Value Ref Range Status   Specimen Description   Final    IN/OUT CATH URINE Performed at [facility], [place] [address] [place], [place] [address]    Special Requests   Final    NONE Performed at [facility], [place] [address], [place], [place] [address]    Culture MULTIPLE SPECIES PRESENT, SUGGEST RECOLLECTION (A)  Final   Report Status 03/09/2019 FINAL  Final  Blood Culture (routine x 2)     Status: None (Preliminary result)   Collection Time: 03/08/19  5:50 PM   Specimen: BLOOD RIGHT FOREARM  Result Value Ref Range Status   Specimen Description   Final    BLOOD RIGHT FOREARM Performed at [place] [name], [place], [place] [address]    Special Requests   Final    BOTTLES DRAWN AEROBIC AND ANAEROBIC Blood Culture results may not be optimal due to an inadequate volume of blood received in culture bottles Performed at [place] [address], [place], [place] [address]    Culture   Final    NO GROWTH 4 DAYS Performed at [facility], [place] [address] [place], [place] [address]    Report Status PENDING  Incomplete  Blood Culture (routine x 2)     Status: None  (Preliminary result)   Collection Time: 03/08/19  6:10 PM   Specimen: BLOOD  Result Value Ref Range Status   Specimen Description   Final    BLOOD RIGHT ANTECUBITAL Performed at [place] [address], [place], [place] [address]    Special Requests   Final    BOTTLES DRAWN AEROBIC AND ANAEROBIC Blood Culture adequate volume Performed at [place] [address], [place], [place] [address]    Culture   Final    NO GROWTH 4 DAYS Performed at [facility], [place]  [address] [place], [place] [address]    Report Status PENDING  Incomplete  Respiratory Panel by RT PCR (Flu A&B, Covid) - Nasopharyngeal Swab     Status: None   Collection Time: 03/08/19  7:16 PM   Specimen: Nasopharyngeal Swab  Result Value Ref Range Status   SARS Coronavirus 2 by RT PCR NEGATIVE NEGATIVE Final    Comment: (NOTE) SARS-CoV-2 target nucleic acids are NOT DETECTED. The SARS-CoV-2 RNA is generally detectable in upper respiratoy specimens during the acute phase of infection. The lowest concentration of SARS-CoV-2 viral copies this assay can detect is 131 copies/mL. A negative result does not preclude SARS-Cov-2 infection and should not be used as the sole basis for treatment or other patient management decisions. A negative result may occur with  improper specimen collection/handling, submission of specimen other than nasopharyngeal swab, presence of viral mutation(s) within the areas targeted by this assay, and inadequate number of viral copies (<131 copies/mL). A negative result must be combined with clinical observations, patient history, and epidemiological information. The expected result is Negative. Fact Sheet for Patients:  PinkCheek.be Fact Sheet for Healthcare Providers:  GravelBags.it This test is not yet ap proved or cleared by the [place] FDA and  has been authorized for detection  and/or diagnosis of SARS-CoV-2 by FDA under an Emergency Use Authorization (EUA). This EUA will remain  in effect (meaning this test can be used) for the duration of the COVID-19 declaration under Section 564(b)(1) of the Act, 21 U.S.C. section 360bbb-3(b)(1), unless the authorization is terminated or revoked sooner.    Influenza A by PCR NEGATIVE NEGATIVE Final   Influenza B by PCR NEGATIVE NEGATIVE Final    Comment: (NOTE) The Xpert Xpress SARS-CoV-2/FLU/RSV assay is intended as an aid in  the diagnosis of influenza from Nasopharyngeal swab specimens and  should not be used as a sole basis for treatment. Nasal washings and  aspirates are unacceptable for Xpert Xpress SARS-CoV-2/FLU/RSV  testing. Fact Sheet for Patients: PinkCheek.be Fact Sheet for Healthcare Providers: GravelBags.it This test is not yet approved or cleared by the [place] FDA and  has been authorized for detection and/or diagnosis of SARS-CoV-2 by  FDA under an Emergency Use Authorization (EUA). This EUA will remain  in effect (meaning this test can be used) for the duration of the  Covid-19 declaration under Section 564(b)(1) of the Act, 21  U.S.C. section 360bbb-3(b)(1), unless the authorization is  terminated or revoked. Performed at [facility], [place] [address] [place], [place] [address]       Radiology Studies: DG CHEST PORT 1 VIEW  Result Date: 03/11/2019 CLINICAL DATA:  86 year old female with history of shortness of breath. EXAM: PORTABLE CHEST 1 VIEW COMPARISON:  Chest x-ray 03/08/2019. FINDINGS: There is cephalization of the pulmonary vasculature and slight indistinctness of the interstitial markings suggestive of mild pulmonary edema. Trace right pleural effusion. Mild cardiomegaly. The patient is rotated to the left on today's exam, resulting in distortion of the mediastinal contours and reduced diagnostic sensitivity and  specificity for mediastinal pathology. Aortic atherosclerosis. Surgical clips projecting over the left axillary region, likely from prior lymph node dissection. IMPRESSION: 1. The appearance of the chest is most compatible with mild congestive heart failure, as above. 2. Aortic atherosclerosis. Electronically Signed   By: Vinnie Langton M.D.   On: 03/11/2019 04:50   VAS [place] LOWER EXTREMITY VENOUS (DVT)  Result Date: 03/11/2019  Lower Venous DVTStudy Indications: Elevated Ddimer.  Risk Factors: None identified. Limitations: Patient positioning,  patient immobility, patient pain tolerance. Comparison Study: No prior studies. Performing Technologist: Oliver Hum RVT  Examination Guidelines: A complete evaluation includes B-mode imaging, spectral Doppler, color Doppler, and power Doppler as needed of all accessible portions of each vessel. Bilateral testing is considered an integral part of a complete examination. Limited examinations for reoccurring indications may be performed as noted. The reflux portion of the exam is performed with the patient in reverse Trendelenburg.  +---------+---------------+---------+-----------+----------+--------------+ RIGHT    CompressibilityPhasicitySpontaneityPropertiesThrombus Aging +---------+---------------+---------+-----------+----------+--------------+ CFV      Full           Yes      Yes                                 +---------+---------------+---------+-----------+----------+--------------+ SFJ      Full                                                        +---------+---------------+---------+-----------+----------+--------------+ FV Prox  Full                                                        +---------+---------------+---------+-----------+----------+--------------+ FV Mid   Full                                                        +---------+---------------+---------+-----------+----------+--------------+ FV DistalFull                                                         +---------+---------------+---------+-----------+----------+--------------+ PFV      Full                                                        +---------+---------------+---------+-----------+----------+--------------+ POP      Full           Yes      Yes                                 +---------+---------------+---------+-----------+----------+--------------+ PTV      Full                                                        +---------+---------------+---------+-----------+----------+--------------+ PERO     Full                                                        +---------+---------------+---------+-----------+----------+--------------+   +---------+---------------+---------+-----------+----------+--------------+  LEFT     CompressibilityPhasicitySpontaneityPropertiesThrombus Aging +---------+---------------+---------+-----------+----------+--------------+ CFV      Full           Yes      Yes                                 +---------+---------------+---------+-----------+----------+--------------+ SFJ      Full                                                        +---------+---------------+---------+-----------+----------+--------------+ FV Prox  Full                                                        +---------+---------------+---------+-----------+----------+--------------+ FV Mid   Full                                                        +---------+---------------+---------+-----------+----------+--------------+ FV DistalFull                                                        +---------+---------------+---------+-----------+----------+--------------+ PFV      Full                                                        +---------+---------------+---------+-----------+----------+--------------+ POP      Full           Yes      Yes                                  +---------+---------------+---------+-----------+----------+--------------+ PTV      Full                                                        +---------+---------------+---------+-----------+----------+--------------+ PERO     Full                                                        +---------+---------------+---------+-----------+----------+--------------+     Summary: RIGHT: - There is no evidence of deep vein thrombosis in the lower extremity.  - No cystic structure found in the popliteal fossa.  LEFT: - There is no evidence of deep vein thrombosis in the lower extremity.  - No  cystic structure found in the popliteal fossa.  *See table(s) above for measurements and observations. Electronically signed by Monica Martinez MD on 03/11/2019 at 11:06:42 AM.    Final     Scheduled Meds: . azithromycin  500 mg Oral QHS  . citalopram  10 mg Oral Daily  . heparin injection (subcutaneous)  5,000 Units Subcutaneous Q8H  . levothyroxine  25 mcg Oral Q0600  . loratadine  10 mg Oral Daily  . metoprolol tartrate  25 mg Oral BID  . oxybutynin  5 mg Oral QHS  . Vitamin D (Ergocalciferol)  50,000 Units Oral Q7 days   Continuous Infusions: . cefTRIAXone (ROCEPHIN)  IV 1 g (03/11/19 1814)  . dextrose 5 % and 0.45% NaCl Stopped (03/12/19 0929)     LOS: 4 days   Time spent: 35 minutes.  Patrecia Pour, MD [facility] [email or web address] 03/12/2019, 3:51 PM

## 2019-03-12 NOTE — Evaluation (Signed)
Physical Therapy Evaluation Patient Details Name: Angela [name] MRN: [id number] DOB: 07-06-1918 Today's Date: 03/12/2019   History of Present Illness  86 year old female admitted with multifocal pneumonia; PMH includes remote breast cancer, s/p mastectomy, RA, anxiety and depression, diffuse edema, malnourished UA.  Clinical Impression  Son was in room visiting when PT arrived. He explained that recently she had been w/c bound, but still able to participate in bathing and dressing with assistance. Other than transfers bed to w/c, she was not ambulatory. Has SPC, RW, and w/c at home. Lives in one level home with family, outside ramp. Patient had been cleared by RN to try PT assessment. Son left room to make a phone call. With a few minutes ( 8) when attempting to reposition in bed and determine her activity level, her HR escalated quickly- between 179-suddenly 200- RN immediately notified and was to contact the physician. Will place patient on medical HOLD until/if patient is medically cleared to participate.She is very frail.     Follow Up Recommendations (Remains to be determined as medically she is unable to participate in PT at this time.)    Equipment Recommendations  None recommended by PT    Recommendations for Other Services       Precautions / Restrictions Precautions Precautions: Fall;Other (comment) Precaution Comments: Will HOLD until medically cleared to participate. Assessement for PT had begun when HR escalated and RN immediately notified. Restrictions Weight Bearing Restrictions: No      Mobility  Bed Mobility Overal bed mobility: Needs Assistance Bed Mobility: Rolling Rolling: Max assist;Total assist         General bed mobility comments: Did not attempt any transfers for supine to sit due to HR= stopped eval and notified RN immediately.  Transfers                    Ambulation/Gait                Stairs            Wheelchair  Mobility    Modified Rankin (Stroke Patients Only)       Balance Overall balance assessment: (Could not test in seated- medical issues defined)                                           Pertinent Vitals/Pain Pain Assessment: No/denies pain    Home Living Family/patient expects to be discharged to:: Unsure Living Arrangements: Children               Additional Comments: Son lives with her and he was visiting in room today.    Prior Function Level of Independence: Needs assistance;Independent with assistive device(s)   Gait / Transfers Assistance Needed: fluctuated, but able to ambulate, and with assistance bathe and dress self.  Most recently was w/c bound.           Hand Dominance   Dominant Hand: Right    Extremity/Trunk Assessment        Lower Extremity Assessment Lower Extremity Assessment: Generalized weakness    Cervical / Trunk Assessment Cervical / Trunk Assessment: Kyphotic  Communication   Communication: Other (comment)(she was very sleepy)  Cognition Arousal/Alertness: Lethargic   Overall Cognitive Status: Impaired/Different from baseline Area of Impairment: Awareness;Orientation                 Orientation Level:  Person             General Comments: She responded to name. Son left room to make a phone call when began PT physical assessment- her HR began to escalate quickly and RN was immediately notified.      General Comments      Exercises Other Exercises Other Exercises: Only as related to repositioning in bed, and Eval had to be stopped as HR escalated and RN notified immediately   Assessment/Plan    PT Assessment (WILL HOLD PT services until/if medically cleared to participate.)  PT Problem List         PT Treatment Interventions      PT Goals (Current goals can be found in the Care Plan section)  Acute Rehab PT Goals Patient Stated Goal: She was lethargic, responded briefly to name- unable  to identify goals PT Goal Formulation: Patient unable to participate in goal setting Time For Goal Achievement: (Unable to complete assessment to establish POC)    Frequency     Barriers to discharge        Co-evaluation               AM-PAC PT "6 Clicks" Mobility  Outcome Measure Help needed turning from your back to your side while in a flat bed without using bedrails?: Total Help needed moving from lying on your back to sitting on the side of a flat bed without using bedrails?: Total Help needed moving to and from a bed to a chair (including a wheelchair)?: Total Help needed standing up from a chair using your arms (e.g., wheelchair or bedside chair)?: Total Help needed to walk in hospital room?: Total Help needed climbing 3-5 steps with a railing? : Total 6 Click Score: 6    End of Session     Patient left: in bed;with call bell/phone within reach(Notified RN and she was contacting physician)   PT Visit Diagnosis: Muscle weakness (generalized) (M62.81)    Time: [id number] PT Time Calculation (min) (ACUTE ONLY): 21 min   Charges:   PT Evaluation $PT Eval Moderate Complexity: 1 Mod         Myrl Lazarus P, PT # [phone] CGV cell  Casandra Doffing 03/12/2019, 3:40 PM

## 2019-03-13 ENCOUNTER — Telehealth: Payer: Self-pay | Admitting: Internal Medicine

## 2019-03-13 LAB — COMPREHENSIVE METABOLIC PANEL
ALT: 16 U/L (ref 0–44)
AST: 25 U/L (ref 15–41)
Albumin: 2 g/dL — ABNORMAL LOW (ref 3.5–5.0)
Alkaline Phosphatase: 59 U/L (ref 38–126)
Anion gap: 4 — ABNORMAL LOW (ref 5–15)
BUN: 21 mg/dL (ref 8–23)
CO2: 26 mmol/L (ref 22–32)
Calcium: 8 mg/dL — ABNORMAL LOW (ref 8.9–10.3)
Chloride: 116 mmol/L — ABNORMAL HIGH (ref 98–111)
Creatinine, Ser: 1.11 mg/dL — ABNORMAL HIGH (ref 0.44–1.00)
GFR calc Af Amer: 48 mL/min — ABNORMAL LOW (ref >60)
GFR calc non Af Amer: 41 mL/min — ABNORMAL LOW (ref >60)
Glucose, Bld: 94 mg/dL (ref 70–99)
Potassium: 4.1 mmol/L (ref 3.5–5.1)
Sodium: 146 mmol/L — ABNORMAL HIGH (ref 135–145)
Total Bilirubin: 0.6 mg/dL (ref 0.3–1.2)
Total Protein: 5.4 g/dL — ABNORMAL LOW (ref 6.5–8.1)

## 2019-03-13 LAB — CBC WITH DIFFERENTIAL/PLATELET
Abs Immature Granulocytes: 0.03 10*3/uL (ref 0.00–0.07)
Basophils Absolute: 0 10*3/uL (ref 0.0–0.1)
Basophils Relative: 1 %
Eosinophils Absolute: 0.1 10*3/uL (ref 0.0–0.5)
Eosinophils Relative: 3 %
HCT: 33.6 % — ABNORMAL LOW (ref 36.0–46.0)
Hemoglobin: 9.3 g/dL — ABNORMAL LOW (ref 12.0–15.0)
Immature Granulocytes: 1 %
Lymphocytes Relative: 23 %
Lymphs Abs: 0.7 10*3/uL (ref 0.7–4.0)
MCH: 26.6 pg (ref 26.0–34.0)
MCHC: 27.7 g/dL — ABNORMAL LOW (ref 30.0–36.0)
MCV: 96.3 fL (ref 80.0–100.0)
Monocytes Absolute: 0.7 10*3/uL (ref 0.1–1.0)
Monocytes Relative: 24 %
Neutro Abs: 1.4 10*3/uL — ABNORMAL LOW (ref 1.7–7.7)
Neutrophils Relative %: 48 %
Platelets: 987 10*3/uL (ref 150–400)
RBC: 3.49 MIL/uL — ABNORMAL LOW (ref 3.87–5.11)
RDW: 21 % — ABNORMAL HIGH (ref 11.5–15.5)
WBC: 2.9 10*3/uL — ABNORMAL LOW (ref 4.0–10.5)
nRBC: 0 % (ref 0.0–0.2)

## 2019-03-13 LAB — CULTURE, BLOOD (ROUTINE X 2)
Culture: NO GROWTH
Culture: NO GROWTH
Special Requests: ADEQUATE

## 2019-03-13 LAB — C-REACTIVE PROTEIN: CRP: 9.9 mg/dL — ABNORMAL HIGH (ref <1.0)

## 2019-03-13 NOTE — Telephone Encounter (Signed)
New message:   Pt's son is calling and states he would like for the patient to be able to go to an assisted living place where she can go and build her strength up. Pt's son needs to know the steps to get this done so she won't be coming home until she builds up her strength. He states the patient currently has some bed sores and a UTI and is currently in the hospital.

## 2019-03-13 NOTE — TOC Progression Note (Signed)
Transition of Care [facility]) - Progression Note    Patient Details  Name: Angela [name] MRN: [id number] Date of Birth: 1918-12-18  Transition of Care [facility]) CM/SW Contact  Purcell Mouton, RN Phone Number: 03/13/2019, 2:33 PM  Clinical Narrative:    Spoke with pt's son concerning discharge plan. Son [name] agreed with pt going to SNF. PASRR completed and info faxed to SNF.    Expected Discharge Plan: [place] Barriers to Discharge: No Barriers Identified  Expected Discharge Plan and Services Expected Discharge Plan: [place] arrangements for the past 2 months: Single Family Home                                       Social Determinants of Health (SDOH) Interventions    Readmission Risk Interventions No flowsheet data found.

## 2019-03-13 NOTE — Progress Notes (Signed)
PROGRESS NOTE  Angela [name]  [id number] DOB: December 16, 1918 DOA: 03/08/2019 PCP: [name], MD   Brief Narrative: Angela [name] is a 86yo female with a history of remote breast CA s/p mastectomy, RA, anxiety, and depression with recent decreased functional independence who was brought by EMS from home on 3/4 due to increasing weakness, inability to transfer to wheelchair per usual as well as pain all over. In the ED, Temperature was 100.75F, WBC wnl, UA revealed pyuria and many bacteria, CXR demonstrated bilateral hazy opacities with patchy infiltrates as well. SARS-CoV-2 Ag and subsequent PCR were both negative as was influenza testing. Ceftriaxone and azithromycin were administered, supplemental oxygen given, and admission requested. PO intake poor and IVF given.   Assessment & Plan: Principal Problem:   Multifocal pneumonia Active Problems:   Rheumatoid arthritis ([place])   Generalized weakness   Acute lower UTI   Pressure injury of skin  Advanced age, debility: I've discussed goals of care with the patient and her son, with whom she lives. They agree certainly to DNR status along with full measures at this time. Poor overall prognosis regarding longevity as well as debility inherent in this patient was also reviewed.  Multifocal pneumonia: With significant elevation in both CRP and PCT more suggestive of bacterial component. - Initially noted to have mildly elevated AST, has ongoing lymphopenia without neutrophilia. In constellation with CXR findings and elevated inflammatory markers including elevated d-dimer, suspicion for covid-19 remains on differential. Thrombocytosis argues for bacterial infection however. Urinalysis appears infected with polyclonal growth on culture  - Continue antibiotics. Check CXR in AM as 5 days have been completed, still somewhat hypoxemic. May need to initiate diuresis.  Hypernatremia: Mild. - Improved with hypotonic IVF. To avoid iatrogenic overload,  stopped IVF, allow diet ad lib.  Severe protein calorie malnutrition: Diffuse cachectic findings on exam, BMI is 15.  - Supplement as able  Hypoglycemia:  - Improved with dextrose-containing IVF.  Leg swelling: Possibly related to hypoalbuminemia due to nutritional deficiency. No DVT on LE venous U/S.  - Still awaiting sample to check spot urine sample to roughly quantify proteinuria.   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 Sarcopenia tends to underestimate renal impairment.  - Avoid nephrotoxins - Stopped IVF with picking up po intake. Note BNP is elevated and there is some edema, though no severe heart failure noted on echo, LVEF 60-65% and edema likely nutritionally (oncotically) mediated. May need diuresis.  Thrombocytosis: Suspected to be reactive, though has had elevated platelets in the past. Also had hematology evaluation for neutropenia and leukopenia in 2016 though work up was deferred due to asymptomatic without recurrent infections and poor functional status..  - Continue monitoring. Showing evidence of improvement.  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 D/w cardiology. Has been struggling with elevated rate today but improved with metoprolol. Fortunately did not require digoxin which is less favored by risk profile. Diltiazem likely an option as well given preserved EF on last check.  - Started metoprolol po and will continue IV prn. - Would not be candidate for ablation. Hope for overall improvement as underlying conditions are treated.  DVT prophylaxis: Heparin [place] due to CrCl ~97ml/min and worsening and weight only 40kg. Code Status: DNR confirmed Family Communication: None at bedside this AM Disposition Plan: Anticipate DC to SNF, initially admitted from home. Possible DC once hypoxemia improved and po intake improved.  Consultants:  Palliative care medicine team  Procedures:   Echocardiogram 3/5:  1. Left ventricular ejection  fraction, by estimation, is 60 to 65%.  The  left ventricle has normal function. The left ventricle has no regional  wall motion abnormalities. Left ventricular diastolic parameters are  consistent with Grade I diastolic  dysfunction (impaired relaxation).  2. Right ventricular systolic function is normal. The right ventricular  size is normal. There is moderately elevated pulmonary artery systolic  pressure.  3. Left atrial size was moderately dilated.  4. Right atrial size was moderately dilated.  5. The mitral valve is normal in structure and function. Trivial mitral  valve regurgitation.  6. Tricuspid valve regurgitation is moderate.  7. The aortic valve is tricuspid. Aortic valve regurgitation is trivial.  Moderate aortic valve stenosis.   Antimicrobials:  Ceftriaxone, azithromycin   Subjective: A bit confused this morning. Has not been helped with breakfast yet but endorsing great appetite. No pain. Denies subjective dyspnea.  Objective: Vitals:   03/12/19 1528 03/12/19 2033 03/13/19 0741 03/13/19 1331  BP: 94/63  128/68 134/73  Pulse: 71  71 70  Resp: 20  18 18   Temp: 97.7 F (36.5 C) 98.5 F (36.9 C) 97.6 F (36.4 C) 97.7 F (36.5 C)  TempSrc: Oral Oral Oral Oral  SpO2: 91%  100% 97%  Weight:      Height:        Intake/Output Summary (Last 24 hours) at 03/13/2019 1443 Last data filed at 03/13/2019 0600 Gross per 24 hour  Intake 60 ml  Output 100 ml  Net -40 ml   Filed Weights   03/08/19 2200  Weight: 40.8 kg   Gen: Elderly, frail female in no distress Pulm: Nonlabored breathing. No crackles anteriorly/laterally. CV: Regular rate and rhythm. No murmur, rub, or gallop. No JVD laying completely flat GI: Abdomen soft, non-tender, non-distended, with normoactive bowel sounds.  Ext: Warm, no deformities. Stable pitting edema diffusely. Skin: No rashes, lesions or ulcers on visualized skin. Neuro: Alert and interactive. Very diffusely weak.  Data Reviewed: I have personally reviewed following  labs and imaging studies  CBC: Recent Labs  Lab 03/09/19 0444 03/10/19 0343 03/11/19 0350 03/12/19 0423 03/13/19 0407  WBC 5.8 4.0 3.9* 4.6 2.9*  NEUTROABS 3.6 2.7 2.4 2.4 1.4*  HGB 11.7* 10.8* 10.8* 11.1* 9.3*  HCT 40.2 37.6 38.2 38.9 33.6*  MCV 90.7 93.5 93.9 94.0 96.3  PLT 727* 765* 972* 1,179* [id number]*   Basic Metabolic Panel: Recent Labs  Lab 03/09/19 0444 03/10/19 0343 03/11/19 0350 03/12/19 0423 03/13/19 0407  NA 145 149* 146* 146* 146*  K 3.0* 3.9 3.8 4.2 4.1  CL 114* 118* 115* 115* 116*  CO2 22 23 23 22 26   GLUCOSE 71 65* 76 137* 94  BUN 16 24* 23 20 21   CREATININE 0.80 1.14* 1.03* 1.00 1.11*  CALCIUM 8.0* 8.0* 8.1* 8.2* 8.0*  MG  --  2.1 2.2 2.6*  --   PHOS  --   --  3.1  --   --    GFR: Estimated Creatinine Clearance: 17.8 mL/min (A) (by C-G formula based on SCr of 1.11 mg/dL (H)). Liver Function Tests: Recent Labs  Lab 03/09/19 0444 03/10/19 0343 03/11/19 0350 03/12/19 0423 03/13/19 0407  AST 31 33 36 32 25  ALT 16 18 19 19 16   ALKPHOS 62 62 59 64 59  BILITOT 1.0 0.7 0.5 0.2* 0.6  PROT 5.6* 5.4* 5.8* 5.7* 5.4*  ALBUMIN 2.3* 2.1* 2.3* 2.2* 2.0*   No results for input(s): LIPASE, AMYLASE in the last 168 hours. No results for input(s): AMMONIA  in the last 168 hours. Coagulation Profile: Recent Labs  Lab 03/08/19 1810  INR 1.4*   Cardiac Enzymes: No results for input(s): CKTOTAL, CKMB, CKMBINDEX, TROPONINI in the last 168 hours. BNP (last 3 results) No results for input(s): PROBNP in the last 8760 hours. HbA1C: No results for input(s): HGBA1C in the last 72 hours. CBG: No results for input(s): GLUCAP in the last 168 hours. Lipid Profile: No results for input(s): CHOL, HDL, LDLCALC, TRIG, CHOLHDL, LDLDIRECT in the last 72 hours. Thyroid Function Tests: No results for input(s): TSH, T4TOTAL, FREET4, T3FREE, THYROIDAB in the last 72 hours. Anemia Panel: Recent Labs    03/12/19 0423  VITAMINB12 1,480*  FOLATE 10.2  FERRITIN 185  TIBC  149*  IRON 33   Urine analysis:    Component Value Date/Time   COLORURINE YELLOW 03/08/2019 1724   APPEARANCEUR HAZY (A) 03/08/2019 1724   LABSPEC 1.016 03/08/2019 1724   PHURINE 6.0 03/08/2019 1724   GLUCOSEU NEGATIVE 03/08/2019 1724   GLUCOSEU NEGATIVE 01/19/2017 1346   HGBUR SMALL (A) 03/08/2019 1724   BILIRUBINUR NEGATIVE 03/08/2019 1724   BILIRUBINUR neg 03/09/2013 1447   KETONESUR NEGATIVE 03/08/2019 1724   PROTEINUR >=300 (A) 03/08/2019 1724   UROBILINOGEN 0.2 01/19/2017 1346   NITRITE NEGATIVE 03/08/2019 1724   LEUKOCYTESUR NEGATIVE 03/08/2019 1724   Recent Results (from the past 240 hour(s))  Urine culture     Status: Abnormal   Collection Time: 03/08/19  5:24 PM   Specimen: In/Out Cath Urine  Result Value Ref Range Status   Specimen Description   Final    IN/OUT CATH URINE Performed at [facility], [place] [address] [place], [place] [address]    Special Requests   Final    NONE Performed at [facility], [place] [address], [place], [place] [address]    Culture MULTIPLE SPECIES PRESENT, SUGGEST RECOLLECTION (A)  Final   Report Status 03/09/2019 FINAL  Final  Blood Culture (routine x 2)     Status: None   Collection Time: 03/08/19  5:50 PM   Specimen: BLOOD RIGHT FOREARM  Result Value Ref Range Status   Specimen Description   Final    BLOOD RIGHT FOREARM Performed at [place] [address], [place], [place] [address]    Special Requests   Final    BOTTLES DRAWN AEROBIC AND ANAEROBIC Blood Culture results may not be optimal due to an inadequate volume of blood received in culture bottles Performed at [place] [address] [place], [place] [address]    Culture   Final    NO GROWTH 5 DAYS Performed at [facility], [place] [address], [place], [place] [address]    Report Status 03/13/2019 FINAL  Final  Blood Culture (routine x 2)     Status: None   Collection Time:  03/08/19  6:10 PM   Specimen: BLOOD  Result Value Ref Range Status   Specimen Description   Final    BLOOD RIGHT ANTECUBITAL Performed at [place] [address] [place], [place] [address]    Special Requests   Final    BOTTLES DRAWN AEROBIC AND ANAEROBIC Blood Culture adequate volume Performed at [place] [address], [place], [place] [address]    Culture   Final    NO GROWTH 5 DAYS Performed at [facility], [place] [address] [place], [place] [address]    Report Status 03/13/2019 FINAL  Final  Respiratory Panel by RT PCR (Flu A&B, Covid) - Nasopharyngeal Swab     Status: None   Collection Time: 03/08/19  7:16 PM   Specimen: Nasopharyngeal Swab  Result Value Ref Range Status   SARS Coronavirus 2 by RT PCR NEGATIVE NEGATIVE Final    Comment: (NOTE) SARS-CoV-2 target nucleic acids are NOT DETECTED. The SARS-CoV-2 RNA is generally detectable in upper respiratoy specimens during the acute phase of infection. The lowest concentration of SARS-CoV-2 viral copies this assay can detect is 131 copies/mL. A negative result does not preclude SARS-Cov-2 infection and should not be used as the sole basis for treatment or other patient management decisions. A negative result may occur with  improper specimen collection/handling, submission of specimen other than nasopharyngeal swab, presence of viral mutation(s) within the areas targeted by this assay, and inadequate number of viral copies (<131 copies/mL). A negative result must be combined with clinical observations, patient history, and epidemiological information. The expected result is Negative. Fact Sheet for Patients:  PinkCheek.be Fact Sheet for Healthcare Providers:  GravelBags.it This test is not yet ap proved or cleared by the [place] FDA and  has been authorized for detection and/or diagnosis of SARS-CoV-2 by  FDA under an Emergency Use Authorization (EUA). This EUA will remain  in effect (meaning this test can be used) for the duration of the COVID-19 declaration under Section 564(b)(1) of the Act, 21 U.S.C. section 360bbb-3(b)(1), unless the authorization is terminated or revoked sooner.    Influenza A by PCR NEGATIVE NEGATIVE Final   Influenza B by PCR NEGATIVE NEGATIVE Final    Comment: (NOTE) The Xpert Xpress SARS-CoV-2/FLU/RSV assay is intended as an aid in  the diagnosis of influenza from Nasopharyngeal swab specimens and  should not be used as a sole basis for treatment. Nasal washings and  aspirates are unacceptable for Xpert Xpress SARS-CoV-2/FLU/RSV  testing. Fact Sheet for Patients: PinkCheek.be Fact Sheet for Healthcare Providers: GravelBags.it This test is not yet approved or cleared by the [place] FDA and  has been authorized for detection and/or diagnosis of SARS-CoV-2 by  FDA under an Emergency Use Authorization (EUA). This EUA will remain  in effect (meaning this test can be used) for the duration of the  Covid-19 declaration under Section 564(b)(1) of the Act, 21  U.S.C. section 360bbb-3(b)(1), unless the authorization is  terminated or revoked. Performed at [facility], [place] [address], [place], [place] [address]       Radiology Studies: No results found.  Scheduled Meds: . citalopram  10 mg Oral Daily  . heparin injection (subcutaneous)  5,000 Units Subcutaneous Q8H  . levothyroxine  25 mcg Oral Q0600  . loratadine  10 mg Oral Daily  . metoprolol tartrate  25 mg Oral BID  . oxybutynin  5 mg Oral QHS  . Vitamin D (Ergocalciferol)  50,000 Units Oral Q7 days   Continuous Infusions: . cefTRIAXone (ROCEPHIN)  IV 1 g (03/12/19 1827)  . dextrose 5 % and 0.45% NaCl Stopped (03/12/19 0929)     LOS: 5 days   Time spent: 35 minutes.  Patrecia Pour, MD [facility]  [email or web address] 03/13/2019, 2:43 PM

## 2019-03-13 NOTE — NC FL2 (Signed)
[place] LEVEL OF CARE SCREENING TOOL     IDENTIFICATION  Patient Name: Angela [name] Birthdate: June 19, 1918 Sex: female Admission Date (Current Location): 03/08/2019  [facility] and [place] Number:  Herbalist and Address:  [facility],  [place], [place]      Provider Number: [id number]  Attending Physician Name and Address:  Patrecia Pour, MD  Relative Name and Phone Number:  Zayliah Brendel son, [phone]    Current Level of Care: Hospital Recommended Level of Care: [place] Prior Approval Number:    Date Approved/Denied:   PASRR Number: [id number]  Discharge Plan: SNF    Current Diagnoses: Patient Active Problem List   Diagnosis Date Noted  . Pressure injury of skin 03/10/2019  . Generalized weakness 03/08/2019  . Acute lower UTI 03/08/2019  . Multifocal pneumonia 03/08/2019  . Abnormal TSH 01/28/2019  . Hypokalemia 01/28/2019  . Vitamin D deficiency 01/28/2019  . Gait disorder 07/18/2017  . Bilateral hearing loss 04/28/2017  . Acquired deformity of toenail 04/28/2017  . Nocturia 04/28/2017  . Encounter for well adult exam with abnormal findings 01/19/2017  . Neuralgia, postherpetic 05/27/2016  . Constipation 05/05/2016  . Shingles 05/05/2016  . OAB (overactive bladder) 05/05/2016  . Contusion 02/26/2016  . Impacted cerumen of both ears 09/25/2015  . Neutropenia ([place]) 09/25/2015  . Bilateral knee contractures 10/26/2013  . Sinusitis, acute 10/26/2013  . Dysuria 04/20/2012  . Other malaise and fatigue 04/20/2012  . Decubitus ulcers 08/10/2011  . Vision loss of left eye 04/06/2011  . Atrophic vaginitis 09/12/2010  . CHOLELITHIASIS, SYMPTOMATIC 10/06/2007  . HLD (hyperlipidemia) 06/16/2007  . ANEMIA-NOS 06/16/2007  . Anxiety state 06/16/2007  . Depression 06/16/2007  . SKIN LESIONS, MULTIPLE 06/16/2007  . ADENOCARCINOMA, BREAST 11/12/2006  . MASTECTOMY, LEFT, HX OF 11/12/2006  .  Allergic rhinitis 10/05/2006  . Rheumatoid arthritis ([place]) 10/05/2006    Orientation RESPIRATION BLADDER Height & Weight     Self  Normal Incontinent Weight: 40.8 kg Height:  5\' 3"  (160 cm)  BEHAVIORAL SYMPTOMS/MOOD NEUROLOGICAL BOWEL NUTRITION STATUS      Incontinent Diet(Regular)  AMBULATORY STATUS COMMUNICATION OF NEEDS Skin   Extensive Assist Verbally Other (Comment)(stage 2 sacrum, Blister right hip, dry flaky skin)                       Personal Care Assistance Level of Assistance  Bathing, Feeding, Dressing Bathing Assistance: Maximum assistance Feeding assistance: Limited assistance Dressing Assistance: Maximum assistance     Functional Limitations Info  Sight, Hearing, Speech Sight Info: Adequate Hearing Info: Adequate      SPECIAL CARE FACTORS FREQUENCY  PT (By licensed PT), OT (By licensed OT)     PT Frequency: x5 OT Frequency: x5            Contractures Contractures Info: Not present    Additional Factors Info  Code Status, Allergies Code Status Info: DNR Allergies Info: Penicillins           Current Medications (03/13/2019):  This is the current hospital active medication list Current Facility-Administered Medications  Medication Dose Route Frequency Provider Last Rate Last Admin  . acetaminophen (TYLENOL) tablet 650 mg  650 mg Oral Q6H PRN Etta Quill, DO   650 mg at 03/11/19 0856  . cefTRIAXone (ROCEPHIN) 1 g in sodium chloride 0.9 % 100 mL IVPB  1 g Intravenous Q24H Kathrynn Humble, Ankit, MD 200 mL/hr at 03/12/19 1827 1 g  at 03/12/19 1827  . citalopram (CELEXA) tablet 10 mg  10 mg Oral Daily Jennette Kettle M, DO   10 mg at 03/13/19 1234  . dextrose 5 %-0.45 % sodium chloride infusion   Intravenous Continuous Patrecia Pour, MD   Stopped at 03/12/19 [phone]  . fluticasone (FLONASE) 50 MCG/ACT nasal spray 2 spray  2 spray Each Nare Daily PRN Pokhrel, Laxman, MD      . heparin injection 5,000 Units  5,000 Units Subcutaneous Q8H Patrecia Pour, MD    5,000 Units at 03/13/19 0558  . HYDROcodone-acetaminophen (NORCO/VICODIN) 5-325 MG per tablet 1 tablet  1 tablet Oral Q6H PRN Flora Lipps, MD   1 tablet at 03/12/19 2032  . levothyroxine (SYNTHROID) tablet 25 mcg  25 mcg Oral Q0600 Etta Quill, DO   25 mcg at 03/13/19 [id number]  . loratadine (CLARITIN) tablet 10 mg  10 mg Oral Daily Jennette Kettle M, DO   10 mg at 03/13/19 1234  . metoprolol tartrate (LOPRESSOR) injection 5 mg  5 mg Intravenous Q1H PRN Vance Gather B, MD      . metoprolol tartrate (LOPRESSOR) tablet 25 mg  25 mg Oral BID Patrecia Pour, MD   25 mg at 03/13/19 1233  . ondansetron (ZOFRAN) tablet 4 mg  4 mg Oral Q6H PRN Etta Quill, DO       Or  . ondansetron [facility]) injection 4 mg  4 mg Intravenous Q6H PRN Etta Quill, DO      . oxybutynin (DITROPAN-XL) 24 hr tablet 5 mg  5 mg Oral QHS Jennette Kettle M, DO   5 mg at 03/12/19 2033  . sodium chloride (OCEAN) 0.65 % nasal spray 1 spray  1 spray Each Nare PRN Patrecia Pour, MD      . Vitamin D (Ergocalciferol) (DRISDOL) capsule 50,000 Units  50,000 Units Oral Q7 days Flora Lipps, MD   50,000 Units at 03/09/19 1526     Discharge Medications: Please see discharge summary for a list of discharge medications.  Relevant Imaging Results:  Relevant Lab Results:   Additional Information SS#[id number]  Purcell Mouton, RN

## 2019-03-13 NOTE — Telephone Encounter (Signed)
Lvm for pt son to return call.

## 2019-03-14 ENCOUNTER — Inpatient Hospital Stay (HOSPITAL_COMMUNITY): Payer: Medicare Other

## 2019-03-14 DIAGNOSIS — M6281 Muscle weakness (generalized): Secondary | ICD-10-CM | POA: Diagnosis not present

## 2019-03-14 DIAGNOSIS — M6289 Other specified disorders of muscle: Secondary | ICD-10-CM | POA: Diagnosis not present

## 2019-03-14 DIAGNOSIS — J9690 Respiratory failure, unspecified, unspecified whether with hypoxia or hypercapnia: Secondary | ICD-10-CM | POA: Diagnosis not present

## 2019-03-14 DIAGNOSIS — R627 Adult failure to thrive: Secondary | ICD-10-CM | POA: Diagnosis not present

## 2019-03-14 DIAGNOSIS — E162 Hypoglycemia, unspecified: Secondary | ICD-10-CM | POA: Diagnosis not present

## 2019-03-14 DIAGNOSIS — M2550 Pain in unspecified joint: Secondary | ICD-10-CM | POA: Diagnosis not present

## 2019-03-14 DIAGNOSIS — R0902 Hypoxemia: Secondary | ICD-10-CM | POA: Diagnosis not present

## 2019-03-14 DIAGNOSIS — Z7401 Bed confinement status: Secondary | ICD-10-CM | POA: Diagnosis not present

## 2019-03-14 DIAGNOSIS — J189 Pneumonia, unspecified organism: Secondary | ICD-10-CM | POA: Diagnosis not present

## 2019-03-14 DIAGNOSIS — N390 Urinary tract infection, site not specified: Secondary | ICD-10-CM | POA: Diagnosis not present

## 2019-03-14 DIAGNOSIS — R410 Disorientation, unspecified: Secondary | ICD-10-CM | POA: Diagnosis not present

## 2019-03-14 DIAGNOSIS — I5031 Acute diastolic (congestive) heart failure: Secondary | ICD-10-CM | POA: Diagnosis not present

## 2019-03-14 DIAGNOSIS — I471 Supraventricular tachycardia: Secondary | ICD-10-CM | POA: Diagnosis not present

## 2019-03-14 DIAGNOSIS — N1832 Chronic kidney disease, stage 3b: Secondary | ICD-10-CM | POA: Diagnosis not present

## 2019-03-14 DIAGNOSIS — J9601 Acute respiratory failure with hypoxia: Secondary | ICD-10-CM | POA: Diagnosis not present

## 2019-03-14 DIAGNOSIS — E43 Unspecified severe protein-calorie malnutrition: Secondary | ICD-10-CM | POA: Diagnosis not present

## 2019-03-14 DIAGNOSIS — D473 Essential (hemorrhagic) thrombocythemia: Secondary | ICD-10-CM | POA: Diagnosis not present

## 2019-03-14 DIAGNOSIS — R5381 Other malaise: Secondary | ICD-10-CM | POA: Diagnosis not present

## 2019-03-14 DIAGNOSIS — M069 Rheumatoid arthritis, unspecified: Secondary | ICD-10-CM | POA: Diagnosis not present

## 2019-03-14 DIAGNOSIS — N179 Acute kidney failure, unspecified: Secondary | ICD-10-CM | POA: Diagnosis not present

## 2019-03-14 DIAGNOSIS — L89610 Pressure ulcer of right heel, unstageable: Secondary | ICD-10-CM | POA: Diagnosis not present

## 2019-03-14 DIAGNOSIS — R531 Weakness: Secondary | ICD-10-CM | POA: Diagnosis not present

## 2019-03-14 LAB — CBC
HCT: 34.5 % — ABNORMAL LOW (ref 36.0–46.0)
Hemoglobin: 9.4 g/dL — ABNORMAL LOW (ref 12.0–15.0)
MCH: 26.7 pg (ref 26.0–34.0)
MCHC: 27.2 g/dL — ABNORMAL LOW (ref 30.0–36.0)
MCV: 98 fL (ref 80.0–100.0)
Platelets: 955 10*3/uL (ref 150–400)
RBC: 3.52 MIL/uL — ABNORMAL LOW (ref 3.87–5.11)
RDW: 20.6 % — ABNORMAL HIGH (ref 11.5–15.5)
WBC: 2.7 10*3/uL — ABNORMAL LOW (ref 4.0–10.5)
nRBC: 0 % (ref 0.0–0.2)

## 2019-03-14 LAB — BASIC METABOLIC PANEL
Anion gap: 7 (ref 5–15)
BUN: 21 mg/dL (ref 8–23)
CO2: 23 mmol/L (ref 22–32)
Calcium: 8 mg/dL — ABNORMAL LOW (ref 8.9–10.3)
Chloride: 115 mmol/L — ABNORMAL HIGH (ref 98–111)
Creatinine, Ser: 0.96 mg/dL (ref 0.44–1.00)
GFR calc Af Amer: 57 mL/min — ABNORMAL LOW (ref >60)
GFR calc non Af Amer: 49 mL/min — ABNORMAL LOW (ref >60)
Glucose, Bld: 70 mg/dL (ref 70–99)
Potassium: 4.1 mmol/L (ref 3.5–5.1)
Sodium: 145 mmol/L (ref 135–145)

## 2019-03-14 LAB — RESPIRATORY PANEL BY RT PCR (FLU A&B, COVID)
Influenza A by PCR: NEGATIVE
Influenza B by PCR: NEGATIVE
SARS Coronavirus 2 by RT PCR: NEGATIVE

## 2019-03-14 LAB — C-REACTIVE PROTEIN: CRP: 7.2 mg/dL — ABNORMAL HIGH (ref <1.0)

## 2019-03-14 MED ORDER — METOPROLOL TARTRATE 25 MG PO TABS: 25.0000 mg | ORAL_TABLET | Freq: Two times a day (BID) | ORAL | Status: AC

## 2019-03-14 MED ORDER — FUROSEMIDE 40 MG PO TABS: 40.0000 mg | ORAL_TABLET | Freq: Once | ORAL | Status: DC

## 2019-03-14 MED ORDER — ACETAMINOPHEN 325 MG PO TABS: 650.0000 mg | ORAL_TABLET | Freq: Four times a day (QID) | ORAL | Status: AC | PRN

## 2019-03-14 MED ORDER — FLUTICASONE PROPIONATE 50 MCG/ACT NA SUSP: 2.0000 | Freq: Every day | NASAL | Status: AC | PRN

## 2019-03-14 NOTE — Evaluation (Signed)
Physical Therapy Evaluation Patient Details Name: Angela [name] MRN: [id number] DOB: [birth year] Today's Date: 03/14/2019   History of Present Illness  86 year old female admitted with multifocal pneumonia; PMH includes remote breast cancer, s/p mastectomy, RA, anxiety and depression, diffuse edema, malnourished UA.  Clinical Impression  She was alert and resting in bed, lying on right side when PT arrived. She required mod-max of 2 for all bed mobility, supine to sit on edge of bed after performing A, AA ex for LEs, and max encouragment/reassurance. HS tight bilaterally, but tends to keep LE flexed when Lying on her side positionally. She required mod-max of 2 for cues re postural awareness as she presents with posterior lean- sit<>stand with mod-max of 2 and 2 side scoots, able to clear hips to reposition in bed. Nursing notified of BM and needing to be cleaning. Spoke with CM and patient should benefit from ongoing rehab at time of discharge to return to [facility] as she was previously ambulatory.     Follow Up Recommendations SNF    Equipment Recommendations  (has equipment at home.)    Recommendations for Other Services OT consult     Precautions / Restrictions Precautions Precautions: Fall;Other (comment) Precaution Comments: She was cleared by RN to participate as HR is under control now. She is anxious, but had been ambulatory PLOF Restrictions Weight Bearing Restrictions: No      Mobility  Bed Mobility Overal bed mobility: Needs Assistance Bed Mobility: Rolling;Sidelying to Sit;Supine to Sit;Sit to Supine;Sit to Sidelying Rolling: Max assist;+2 for physical assistance;Mod assist Sidelying to sit: Mod assist;Max assist;+2 for physical assistance Supine to sit: Mod assist;Max assist;Total assist Sit to supine: Mod assist;Max assist;+2 for physical assistance Sit to sidelying: Mod assist;Max assist;+2 for physical assistance General bed mobility comments: Mod to Max A of 2 to  clear hips, and scoot up in bed, but was able to bear weight through LEs  Transfers Overall transfer level: Needs assistance Equipment used: 2 person hand held assist(may try RW) Transfers: Sit to/from Stand Sit to Stand: Mod assist;Max assist;+2 physical assistance;+2 safety/equipment         General transfer comment: Could initiate 1-2 side steps, clearing hips to scoot up in bed. She was needing to be cleaned up from diarrhea noted on bed pad- Nursing notified.  Ambulation/Gait Ambulation/Gait assistance: Max assist;+2 safety/equipment;+2 physical assistance Gait Distance (Feet): 2 Feet(side steps. Had difficulty remaining erect in posture, fatigued)            Stairs            Wheelchair Mobility    Modified Rankin (Stroke Patients Only)       Balance Overall balance assessment: Needs assistance Sitting-balance support: Bilateral upper extremity supported Sitting balance-Leahy Scale: Fair   Postural control: Posterior lean Standing balance support: Bilateral upper extremity supported Standing balance-Leahy Scale: Poor(P+-F and tended to fatigue. She was ambulatory in PLOF)                               Pertinent Vitals/Pain Pain Assessment: No/denies pain    Home Living                        Prior Function   She was ambulatory with AD in household distances and able to assist self in bathing and dressing.               Hand Dominance  RIGHT    Extremity/Trunk Assessment    Mildly kyphotic            Communication    Alert and oriented to self and place, son during this session.  Cognition Arousal/Alertness: Awake/alert   Overall Cognitive Status: Impaired/Different from baseline Area of Impairment: Awareness;Orientation                 Orientation Level: Person             General Comments: Max reassurance and requires Mod-Max of 2 for all change of positions. Both LEs- HS are tight       General Comments      Exercises General Exercises - Lower Extremity Ankle Circles/Pumps: AAROM;Supine Short Arc Quad: AAROM;Supine Heel Slides: AAROM;Supine Hip ABduction/ADduction: AAROM;Supine Other Exercises Other Exercises: HS tight bilaterally, which according to the son was not noted when she was ambulating at home.   Assessment/Plan    PT Assessment    PT Problem List         PT Treatment Interventions      PT Goals (Current goals can be found in the Care Plan section)  Acute Rehab PT Goals Patient Stated Goal: She was alert and oriented to name and place, also to son PT Goal Formulation: Patient unable to participate in goal setting Time For Goal Achievement: 03/28/19 Potential to Achieve Goals: Good    Frequency Min 3X/week   Barriers to discharge        Co-evaluation               AM-PAC PT "6 Clicks" Mobility  Outcome Measure Help needed turning from your back to your side while in a flat bed without using bedrails?: A Lot Help needed moving from lying on your back to sitting on the side of a flat bed without using bedrails?: A Lot Help needed moving to and from a bed to a chair (including a wheelchair)?: A Lot Help needed standing up from a chair using your arms (e.g., wheelchair or bedside chair)?: A Lot Help needed to walk in hospital room?: A Lot Help needed climbing 3-5 steps with a railing? : Total 6 Click Score: 11    End of Session     Patient left: in bed;with call bell/phone within reach   PT Visit Diagnosis: Muscle weakness (generalized) (M62.81);Unsteadiness on feet (R26.81);Other abnormalities of gait and mobility (R26.89);Difficulty in walking, not elsewhere classified (R26.2)    Time: [id number] PT Time Calculation (min) (ACUTE ONLY): 29 min   Charges:     PT Treatments $Therapeutic Exercise: 8-22 mins $Therapeutic Activity: 8-22 mins        Rollen Sox, PT # [phone] CGV cell  Casandra Doffing 03/14/2019, 11:37  AM

## 2019-03-14 NOTE — Progress Notes (Signed)
Report called to Olivia Mackie, LPN at [facility]. EMS arrived to pick up pt to transport [employer]. SRP, RN

## 2019-03-14 NOTE — Discharge Summary (Addendum)
Physician Discharge Summary  Angela [name] [id number] DOB: Dec 30, 1918  PCP: [name], MD  Admitted from: Home Discharged to: SNF  Admit date: 03/08/2019 Discharge date: 03/14/2019  Recommendations for Outpatient Follow-up:   Follow-up Information    MD at SNF. Schedule an appointment as soon as possible for a visit.   Why: To be seen in 2 to 3 days with repeat labs (CBC & BMP).  Recommend palliative care consultation and follow-up at SNF.       [name], MD. Schedule an appointment as soon as possible for a visit.   Specialties: Internal Medicine, Radiology Why: Upon discharge from SNF. Contact information: [place] [address] [phone]            Home Health: N/A Equipment/Devices: TBD at SNF  Discharge Condition: Improved and stable.  Overall long-term prognosis is guarded and poor. CODE STATUS: DNR Diet recommendation: Heart healthy diet.  Discharge Diagnoses:  Principal Problem:   Multifocal pneumonia Active Problems:   Rheumatoid arthritis ([place])   Generalized weakness   Acute lower UTI   Pressure injury of skin   Brief Summary: 86 year old female with remote history of breast cancer s/p mastectomy, RA, anxiety and depression, recent decreased functional independence who was brought by EMS from home on 3/4 due to increased weakness, inability to transfer to wheelchair per usual as well as pain all over.  In the ED, temperature was 100.2 F, normal WBC, UA revealed pyuria and many bacteria, chest x-ray demonstrated bilateral hazy opacities with patchy infiltrates.  SARS-CoV-2 antigen and subsequent PCR were both negative as was influenza testing.  She was admitted for further evaluation and management.  Assessment and plan:  1. Advanced age, debility, adult failure to thrive: Multifactorial due to very advanced age, suspected dementia and multiple comorbidities.  My prior hospitalist colleague discussed goals of care with  patient and her son with whom she lives.  They agreed to DNR along with full scope of treatment at this time.  Her poor overall prognosis regarding longevity as well as debility was reviewed with family. 2. Multifocal pneumonia: With significant elevation in both CRP and PCT suggestive of bacterial component.  Initially noted to have mildly elevated AST, ongoing lymphopenia without neutrophilia.  And constellation of chest x-ray findings and elevated inflammatory markers including elevated D-dimer, there was suspicion for COVID-19.  However COVID-19 testing has been repeatedly negative including today.  Thrombocytosis is likely reactive due to pneumonia.  Urine analysis suggestive of contaminated sample.  Patient has completed 6 days of IV antibiotics.  She has completed adequate treatment for both suspected UTI as well as pneumonia.  Although chest x-ray suggest worsening of bilateral interstitial and airspace opacities, clinically she has improved, weaned down to oxygen 1 L/min, does not appear in any distress and no fever noted.  Also does not appear clinically volume overloaded.  Provided her with a dose of Lasix 40 mg orally x1 prior to discharge and follow clinically at SNF.  May consider repeating chest x-ray in 4 weeks to ensure resolution of pneumonia findings. 3. Acute respiratory failure with hypoxia: Due to multifocal pneumonia and decompensated CHF.  Improving.  Wean off oxygen at SNF as tolerated for sats greater than 92%. 4. Hypernatremia, mild: Resolved.  Hypotonic IV fluids were discontinued on 3/8. 5. Severe protein calorie malnutrition: Diet as tolerated.  Not likely to improve unless oral intake improves. 6. Hypoglycemia: Has had no further hypoglycemic episodes but remains at risk if  she has inconsistent and poor oral intake.  May monitor CBGs periodically as needed.  Encourage oral intake.  Has not been on D5 infusion since 3/8. 7. Leg swelling: Possibly related to hypoalbuminemia from  malnutrition.  Lower extremity venous Dopplers negative for DVT. 8. Acute kidney injury complicating stage IIIb CKD: With proteinuria.  Avoid nephrotoxins.  Creatinine has normalized.  Periodically follow BMP. 9. Acute diastolic CHF: LVEF [id number] by TTE.  Not overtly volume overloaded.  Chest x-ray suggests patchy opacities may be due to edema.  Hence provided a dose of Lasix 40 mg orally x1 prior to discharge.  Follow clinically at SNF and determine if needs any further diuretics. 10. Thrombocytosis: Suspect reactive.  Has had elevated platelets in the past.  Also had hematology evaluation for neutropenia and leukopenia in 2016 though work-up was deferred due to asymptomatic without recurrent infections and poor functional status.  Does have leukopenia, may be related to acute infection versus antibiotics.  Follow CBCs closely 11. 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  Prior hospitalist discussed with cardiology.  Improved with metoprolol, continue.  Would not be a candidate for ablation.   Consultations:  Palliative care medicine.  Procedures:  None   Discharge Instructions  Discharge Instructions    (HEART FAILURE PATIENTS) Call MD:  Anytime you have any of the following symptoms: 1) 3 pound weight gain in 24 hours or 5 pounds in 1 week 2) shortness of breath, with or without a dry hacking cough 3) swelling in the hands, feet or stomach 4) if you have to sleep on extra pillows at night in order to breathe.   Complete by: As directed    Call MD for:  difficulty breathing, headache or visual disturbances   Complete by: As directed    Call MD for:  extreme fatigue   Complete by: As directed    Call MD for:  persistant dizziness or light-headedness   Complete by: As directed    Call MD for:  persistant nausea and vomiting   Complete by: As directed    Call MD for:  severe uncontrolled pain   Complete by: As directed    Call MD for:  temperature >100.4   Complete by: As directed     Diet - low sodium heart healthy   Complete by: As directed    Increase activity slowly   Complete by: As directed        Medication List    STOP taking these medications   fexofenadine 180 MG tablet Commonly known as: ALLEGRA   oxybutynin 5 MG 24 hr tablet Commonly known as: DITROPAN-XL   potassium chloride 10 MEQ tablet Commonly known as: Klor-Con 10     TAKE these medications   acetaminophen 325 MG tablet Commonly known as: TYLENOL Take 2 tablets (650 mg total) by mouth every 6 (six) hours as needed for mild pain, moderate pain, fever or headache (fever >/= 101).   citalopram 10 MG tablet Commonly known as: CeleXA Take 1 tablet (10 mg total) by mouth daily.   fluticasone 50 MCG/ACT nasal spray Commonly known as: FLONASE Place 2 sprays into both nostrils daily as needed for allergies. SHAKE LIQUID AND USE 2 SPRAYS IN EACH NOSTRIL DAILY   levothyroxine 25 MCG tablet Commonly known as: SYNTHROID Take 1 tablet (25 mcg total) by mouth daily before breakfast.   metoprolol tartrate 25 MG tablet Commonly known as: LOPRESSOR Take 1 tablet (25 mg total) by mouth 2 (two) times  daily.   Vitamin D (Ergocalciferol) 1.25 MG (50000 UNIT) Caps capsule Commonly known as: DRISDOL Take 1 capsule (50,000 Units total) by mouth every 7 (seven) days.      Allergies  Allergen Reactions  . Penicillins     REACTION: Question of penicillin allergy      Procedures/Studies: DG Shoulder Right  Result Date: 03/08/2019 CLINICAL DATA:  Right shoulder pain, no known injury, initial encounter EXAM: RIGHT SHOULDER - 2+ VIEW COMPARISON:  None. FINDINGS: Degenerative changes of the glenohumeral joint are seen. Some deformity of the proximal humerus is seen likely related to prior fracture with healing. Degenerative changes of the acromioclavicular joint are noted as well. The underlying bony thorax shows no acute abnormality. IMPRESSION: Chronic changes in the shoulder without acute  abnormality. Electronically Signed   By: Inez Catalina M.D.   On: 03/08/2019 21:30   CT HEAD WO CONTRAST  Result Date: 03/08/2019 CLINICAL DATA:  Recent falls with headaches and neck pain, initial encounter EXAM: CT HEAD WITHOUT CONTRAST CT CERVICAL SPINE WITHOUT CONTRAST TECHNIQUE: Multidetector CT imaging of the head and cervical spine was performed following the standard protocol without intravenous contrast. Multiplanar CT image reconstructions of the cervical spine were also generated. COMPARISON:  07/19/2009 FINDINGS: CT HEAD FINDINGS Brain: Heavy calcification of the basal ganglia is seen bilaterally and stable. Increasing chronic white matter ischemic change is noted. Mild atrophic changes are seen as well. Vascular: No hyperdense vessel or unexpected calcification. Skull: Normal. Negative for fracture or focal lesion. Sinuses/Orbits: No acute finding. Other: None. CT CERVICAL SPINE FINDINGS Alignment: Within normal limits. Skull base and vertebrae: Exam is somewhat limited by patient motion artifact. Seven cervical segments are well visualized. Vertebral body height is well maintained. Disc space narrowing is noted from C2-C6 with mild osteophytic changes. No acute fracture or acute facet abnormality is noted. The odontoid is within normal limits. Soft tissues and spinal canal: Surrounding soft tissue structures show no acute abnormality. Upper chest: Visualized lung apices are unremarkable. Other: None IMPRESSION: CT of the head: Chronic atrophic and ischemic changes without acute abnormality. CT of the cervical spine: Somewhat limited exam due to patient motion artifact. Multilevel degenerative change is seen without acute abnormality. Electronically Signed   By: Inez Catalina M.D.   On: 03/08/2019 21:39   CT CERVICAL SPINE WO CONTRAST  Result Date: 03/08/2019 CLINICAL DATA:  Recent falls with headaches and neck pain, initial encounter EXAM: CT HEAD WITHOUT CONTRAST CT CERVICAL SPINE WITHOUT CONTRAST  TECHNIQUE: Multidetector CT imaging of the head and cervical spine was performed following the standard protocol without intravenous contrast. Multiplanar CT image reconstructions of the cervical spine were also generated. COMPARISON:  07/19/2009 FINDINGS: CT HEAD FINDINGS Brain: Heavy calcification of the basal ganglia is seen bilaterally and stable. Increasing chronic white matter ischemic change is noted. Mild atrophic changes are seen as well. Vascular: No hyperdense vessel or unexpected calcification. Skull: Normal. Negative for fracture or focal lesion. Sinuses/Orbits: No acute finding. Other: None. CT CERVICAL SPINE FINDINGS Alignment: Within normal limits. Skull base and vertebrae: Exam is somewhat limited by patient motion artifact. Seven cervical segments are well visualized. Vertebral body height is well maintained. Disc space narrowing is noted from C2-C6 with mild osteophytic changes. No acute fracture or acute facet abnormality is noted. The odontoid is within normal limits. Soft tissues and spinal canal: Surrounding soft tissue structures show no acute abnormality. Upper chest: Visualized lung apices are unremarkable. Other: None IMPRESSION: CT of the head: Chronic atrophic and  ischemic changes without acute abnormality. CT of the cervical spine: Somewhat limited exam due to patient motion artifact. Multilevel degenerative change is seen without acute abnormality. Electronically Signed   By: Inez Catalina M.D.   On: 03/08/2019 21:39   DG CHEST PORT 1 VIEW  Result Date: 03/14/2019 CLINICAL DATA:  Respiratory failure, pneumonia EXAM: PORTABLE CHEST 1 VIEW COMPARISON:  03/11/2019 FINDINGS: Cardiomegaly. Vascular congestion and bilateral interstitial and airspace opacities, worsening since prior study. Suspect small effusions. No acute bony abnormality. IMPRESSION: Worsening bilateral interstitial and airspace opacities, edema versus infection. Small bilateral effusions. Electronically Signed   By:  Rolm Baptise M.D.   On: 03/14/2019 08:24   DG CHEST PORT 1 VIEW  Result Date: 03/11/2019 CLINICAL DATA:  86 year old female with history of shortness of breath. EXAM: PORTABLE CHEST 1 VIEW COMPARISON:  Chest x-ray 03/08/2019. FINDINGS: There is cephalization of the pulmonary vasculature and slight indistinctness of the interstitial markings suggestive of mild pulmonary edema. Trace right pleural effusion. Mild cardiomegaly. The patient is rotated to the left on today's exam, resulting in distortion of the mediastinal contours and reduced diagnostic sensitivity and specificity for mediastinal pathology. Aortic atherosclerosis. Surgical clips projecting over the left axillary region, likely from prior lymph node dissection. IMPRESSION: 1. The appearance of the chest is most compatible with mild congestive heart failure, as above. 2. Aortic atherosclerosis. Electronically Signed   By: Vinnie Langton M.D.   On: 03/11/2019 04:50   DG Chest Port 1 View  Result Date: 03/08/2019 CLINICAL DATA:  Fall, fever EXAM: PORTABLE CHEST 1 VIEW COMPARISON:  Radiograph August 30, 2013 FINDINGS: Diffuse hazy interstitial opacities throughout both lungs with more patchy opacity in the right lung base and a small right pleural effusion. No pneumothorax. Cardiac silhouette is borderline enlarged. The aorta is calcified and tortuous. The osseous structures appear diffusely demineralized which may limit detection of small or nondisplaced fractures. Degenerative changes are present in the imaged spine and shoulders. Stable dextrocurvature of the thoracic spine. Postsurgical changes in the left axilla. IMPRESSION: Diffuse hazy interstitial opacities throughout both lungs with more patchy opacity in the right lung base and a small right pleural effusion. Findings may represent pulmonary edema and/or multifocal pneumonia. Electronically Signed   By: Lovena Le M.D.   On: 03/08/2019 18:19   ECHOCARDIOGRAM COMPLETE  Result Date:  03/09/2019    ECHOCARDIOGRAM REPORT   Patient Name:   KHAWLA BROADAWAY Date of Exam: 03/09/2019 Medical Rec #:  [id number]        Height:       63.0 in Accession #:    [id number]       Weight:       89.9 lb Date of Birth:  Apr 05, 1918        BSA:          1.377 m Patient Age:    86 years         BP:           102/62 mmHg Patient Gender: F                HR:           85 bpm. Exam Location:  Inpatient Procedure: 2D Echo, Color Doppler and Cardiac Doppler Indications:    R06.9 DOE  History:        Patient has no prior history of Echocardiogram examinations.                 Risk Factors:Dyslipidemia. Mastectomy.  Sonographer:    Raquel Sarna Senior RDCS Referring Phys: [id number] [place]  1. Left ventricular ejection fraction, by estimation, is 60 to 65%. The left ventricle has normal function. The left ventricle has no regional wall motion abnormalities. Left ventricular diastolic parameters are consistent with Grade I diastolic dysfunction (impaired relaxation).  2. Right ventricular systolic function is normal. The right ventricular size is normal. There is moderately elevated pulmonary artery systolic pressure.  3. Left atrial size was moderately dilated.  4. Right atrial size was moderately dilated.  5. The mitral valve is normal in structure and function. Trivial mitral valve regurgitation.  6. Tricuspid valve regurgitation is moderate.  7. The aortic valve is tricuspid. Aortic valve regurgitation is trivial. Moderate aortic valve stenosis. FINDINGS  Left Ventricle: Left ventricular ejection fraction, by estimation, is 60 to 65%. The left ventricle has normal function. The left ventricle has no regional wall motion abnormalities. The left ventricular internal cavity size was normal in size. There is  no left ventricular hypertrophy. Left ventricular diastolic parameters are consistent with Grade I diastolic dysfunction (impaired relaxation). Right Ventricle: The right ventricular size is normal. No increase  in right ventricular wall thickness. Right ventricular systolic function is normal. There is moderately elevated pulmonary artery systolic pressure. The tricuspid regurgitant velocity is 3.26 m/s, and with an assumed right atrial pressure of 3 mmHg, the estimated right ventricular systolic pressure is [id number] mmHg. Left Atrium: Left atrial size was moderately dilated. Right Atrium: Right atrial size was moderately dilated. Pericardium: There is no evidence of pericardial effusion. Mitral Valve: The mitral valve is normal in structure and function. There is mild calcification of the mitral valve leaflet(s). Trivial mitral valve regurgitation. Tricuspid Valve: The tricuspid valve is normal in structure. Tricuspid valve regurgitation is moderate. Aortic Valve: The aortic valve is tricuspid. Aortic valve regurgitation is trivial. Moderate aortic stenosis is present. There is moderate calcification of the aortic valve. Aortic valve mean gradient measures 15.0 mmHg. Aortic valve peak gradient measures 25.8 mmHg. Aortic valve area, by VTI measures 0.72 cm. Pulmonic Valve: The pulmonic valve was normal in structure. Pulmonic valve regurgitation is not visualized. Aorta: The aortic root and ascending aorta are structurally normal, with no evidence of dilitation. IAS/Shunts: No atrial level shunt detected by color flow Doppler.  LEFT VENTRICLE PLAX 2D LVIDd:         3.50 cm  Diastology LVIDs:         2.60 cm  LV e' lateral:   3.09 cm/s LV PW:         1.20 cm  LV E/e' lateral: 20.0 LV IVS:        1.00 cm  LV e' medial:    2.47 cm/s LVOT diam:     1.80 cm  LV E/e' medial:  25.0 LV SV:         35 LV SV Index:   25 LVOT Area:     2.54 cm  RIGHT VENTRICLE RV S prime:     8.83 cm/s TAPSE (M-mode): 1.4 cm LEFT ATRIUM             Index       RIGHT ATRIUM           Index LA diam:        3.40 cm 2.47 cm/m  RA Area:     18.50 cm LA Vol (A2C):   66.2 ml 48.08 ml/m RA Volume:   58.40 ml  42.42 ml/m LA Vol (A4C):  48.0 ml 34.86 ml/m  LA Biplane Vol: 56.8 ml 41.25 ml/m  AORTIC VALVE AV Area (Vmax):    0.76 cm AV Area (Vmean):   0.72 cm AV Area (VTI):     0.72 cm AV Vmax:           254.00 cm/s AV Vmean:          185.000 cm/s AV VTI:            0.487 m AV Peak Grad:      25.8 mmHg AV Mean Grad:      15.0 mmHg LVOT Vmax:         75.70 cm/s LVOT Vmean:        52.700 cm/s LVOT VTI:          0.137 m LVOT/AV VTI ratio: 0.28  AORTA Ao Root diam: 2.30 cm Ao Asc diam:  2.30 cm MITRAL VALVE                TRICUSPID VALVE MV Area (PHT): 2.87 cm     TR Peak grad:   42.5 mmHg MV Decel Time: 264 msec     TR Vmax:        326.00 cm/s MV E velocity: 61.70 cm/s MV A velocity: 111.00 cm/s  SHUNTS MV E/A ratio:  0.56         Systemic VTI:  0.14 m                             Systemic Diam: 1.80 cm Glori Bickers MD Electronically signed by Glori Bickers MD Signature Date/Time: 03/09/2019/6:22:33 PM    Final    VAS [place] LOWER EXTREMITY VENOUS (DVT)  Result Date: 03/11/2019  Lower Venous DVTStudy Indications: Elevated Ddimer.  Risk Factors: None identified. Limitations: Patient positioning, patient immobility, patient pain tolerance. Comparison Study: No prior studies. Performing Technologist: Oliver Hum RVT  Examination Guidelines: A complete evaluation includes B-mode imaging, spectral Doppler, color Doppler, and power Doppler as needed of all accessible portions of each vessel. Bilateral testing is considered an integral part of a complete examination. Limited examinations for reoccurring indications may be performed as noted. The reflux portion of the exam is performed with the patient in reverse Trendelenburg.  +---------+---------------+---------+-----------+----------+--------------+ RIGHT    CompressibilityPhasicitySpontaneityPropertiesThrombus Aging +---------+---------------+---------+-----------+----------+--------------+ CFV      Full           Yes      Yes                                  +---------+---------------+---------+-----------+----------+--------------+ SFJ      Full                                                        +---------+---------------+---------+-----------+----------+--------------+ FV Prox  Full                                                        +---------+---------------+---------+-----------+----------+--------------+ FV Mid   Full                                                        +---------+---------------+---------+-----------+----------+--------------+  FV DistalFull                                                        +---------+---------------+---------+-----------+----------+--------------+ PFV      Full                                                        +---------+---------------+---------+-----------+----------+--------------+ POP      Full           Yes      Yes                                 +---------+---------------+---------+-----------+----------+--------------+ PTV      Full                                                        +---------+---------------+---------+-----------+----------+--------------+ PERO     Full                                                        +---------+---------------+---------+-----------+----------+--------------+   +---------+---------------+---------+-----------+----------+--------------+ LEFT     CompressibilityPhasicitySpontaneityPropertiesThrombus Aging +---------+---------------+---------+-----------+----------+--------------+ CFV      Full           Yes      Yes                                 +---------+---------------+---------+-----------+----------+--------------+ SFJ      Full                                                        +---------+---------------+---------+-----------+----------+--------------+ FV Prox  Full                                                         +---------+---------------+---------+-----------+----------+--------------+ FV Mid   Full                                                        +---------+---------------+---------+-----------+----------+--------------+ FV DistalFull                                                        +---------+---------------+---------+-----------+----------+--------------+   PFV      Full                                                        +---------+---------------+---------+-----------+----------+--------------+ POP      Full           Yes      Yes                                 +---------+---------------+---------+-----------+----------+--------------+ PTV      Full                                                        +---------+---------------+---------+-----------+----------+--------------+ PERO     Full                                                        +---------+---------------+---------+-----------+----------+--------------+     Summary: RIGHT: - There is no evidence of deep vein thrombosis in the lower extremity.  - No cystic structure found in the popliteal fossa.  LEFT: - There is no evidence of deep vein thrombosis in the lower extremity.  - No cystic structure found in the popliteal fossa.  *See table(s) above for measurements and observations. Electronically signed by Monica Martinez MD on 03/11/2019 at 11:06:42 AM.    Final        Subjective: Patient interviewed and examined along with her female RN in the room.  Patient reports some diffuse nonspecific pains.  No chest pain or dyspnea reported. Poor historian.  As per RN, patient is total care.  Discharge Exam:  Vitals:   03/14/19 1126 03/14/19 1127 03/14/19 1135 03/14/19 1409  BP:    (!) 106/58  Pulse:    63  Resp:    16  Temp:    98 F (36.7 C)  TempSrc:    Oral  SpO2: 99% 99% 96% 97%  Weight:      Height:        General: Elderly female, small built, frail and chronically ill looking  lying comfortably propped up in bed without distress. Cardiovascular: S1 & S2 heard, RRR, S1/S2 +. No murmurs, rubs, gallops or clicks. No JVD.  Telemetry personally reviewed: Sinus rhythm. Respiratory: Slightly diminished breath sounds in the bases with occasional basal crackles but otherwise clear to auscultation.  No increased work of breathing.  No wheezing or rhonchi heard. Abdominal:  Non distended, non tender & soft. No organomegaly or masses appreciated. Normal bowel sounds heard. CNS: Alert and oriented x2. No focal deficits. Extremities: Chronic leg changes, skin is thickened and shriveled.  No overt edema or acute findings.    The results of significant diagnostics from this hospitalization (including imaging, microbiology, ancillary and laboratory) are listed below for reference.     Microbiology: Recent Results (from the past 240 hour(s))  Urine culture     Status: Abnormal   Collection Time: 03/08/19  5:24 PM  Specimen: In/Out Cath Urine  Result Value Ref Range Status   Specimen Description   Final    IN/OUT CATH URINE Performed at [facility] [address], [place], [place] [address]    Special Requests   Final    NONE Performed at [facility], [place] [address], [place], [place] [address]    Culture MULTIPLE SPECIES PRESENT, SUGGEST RECOLLECTION (A)  Final   Report Status 03/09/2019 FINAL  Final  Blood Culture (routine x 2)     Status: None   Collection Time: 03/08/19  5:50 PM   Specimen: BLOOD RIGHT FOREARM  Result Value Ref Range Status   Specimen Description   Final    BLOOD RIGHT FOREARM Performed at [facility] [address] [place], [place] [address]    Special Requests   Final    BOTTLES DRAWN AEROBIC AND ANAEROBIC Blood Culture results may not be optimal due to an inadequate volume of blood received in culture bottles Performed at [facility] [address]  [place], [place] [address]    Culture   Final    NO GROWTH 5 DAYS Performed at [facility], [place] [address], [place], [place] [address]    Report Status 03/13/2019 FINAL  Final  Blood Culture (routine x 2)     Status: None   Collection Time: 03/08/19  6:10 PM   Specimen: BLOOD  Result Value Ref Range Status   Specimen Description   Final    BLOOD RIGHT ANTECUBITAL Performed at [facility] [address], [place], [place] [address]    Special Requests   Final    BOTTLES DRAWN AEROBIC AND ANAEROBIC Blood Culture adequate volume Performed at [facility] [address] [place], [place] [address]    Culture   Final    NO GROWTH 5 DAYS Performed at [facility], [place] [address] [place],  [address]    Report Status 03/13/2019 FINAL  Final  Respiratory Panel by RT PCR (Flu A&B, Covid) - Nasopharyngeal Swab     Status: None   Collection Time: 03/08/19  7:16 PM   Specimen: Nasopharyngeal Swab  Result Value Ref Range Status   SARS Coronavirus 2 by RT PCR NEGATIVE NEGATIVE Final    Comment: (NOTE) SARS-CoV-2 target nucleic acids are NOT DETECTED. The SARS-CoV-2 RNA is generally detectable in upper respiratoy specimens during the acute phase of infection. The lowest concentration of SARS-CoV-2 viral copies this assay can detect is 131 copies/mL. A negative result does not preclude SARS-Cov-2 infection and should not be used as the sole basis for treatment or other patient management decisions. A negative result may occur with  improper specimen collection/handling, submission of specimen other than nasopharyngeal swab, presence of viral mutation(s) within the areas targeted by this assay, and inadequate number of viral copies (<131 copies/mL). A negative result must be combined with clinical observations, patient history, and epidemiological information. The expected result is Negative. Fact Sheet for Patients:   PinkCheek.be Fact Sheet for Healthcare Providers:  GravelBags.it This test is not yet ap proved or cleared by the [place] FDA and  has been authorized for detection and/or diagnosis of SARS-CoV-2 by FDA under an Emergency Use Authorization (EUA). This EUA will remain  in effect (meaning this test can be used) for the duration of the COVID-19 declaration under Section 564(b)(1) of the Act, 21 U.S.C. section 360bbb-3(b)(1), unless the authorization is terminated or  revoked sooner.    Influenza A by PCR NEGATIVE NEGATIVE Final   Influenza B by PCR NEGATIVE NEGATIVE Final    Comment: (NOTE) The Xpert Xpress SARS-CoV-2/FLU/RSV assay is intended as an aid in  the diagnosis of influenza from Nasopharyngeal swab specimens and  should not be used as a sole basis for treatment. Nasal washings and  aspirates are unacceptable for Xpert Xpress SARS-CoV-2/FLU/RSV  testing. Fact Sheet for Patients: PinkCheek.be Fact Sheet for Healthcare Providers: GravelBags.it This test is not yet approved or cleared by the [place] FDA and  has been authorized for detection and/or diagnosis of SARS-CoV-2 by  FDA under an Emergency Use Authorization (EUA). This EUA will remain  in effect (meaning this test can be used) for the duration of the  Covid-19 declaration under Section 564(b)(1) of the Act, 21  U.S.C. section 360bbb-3(b)(1), unless the authorization is  terminated or revoked. Performed at [facility], [place] [address] [place], [place] [address]   Respiratory Panel by RT PCR (Flu A&B, Covid) - Nasopharyngeal Swab     Status: None   Collection Time: 03/14/19 12:05 PM   Specimen: Nasopharyngeal Swab  Result Value Ref Range Status   SARS Coronavirus 2 by RT PCR NEGATIVE NEGATIVE Final    Comment: (NOTE) SARS-CoV-2 target nucleic acids are NOT DETECTED. The  SARS-CoV-2 RNA is generally detectable in upper respiratoy specimens during the acute phase of infection. The lowest concentration of SARS-CoV-2 viral copies this assay can detect is 131 copies/mL. A negative result does not preclude SARS-Cov-2 infection and should not be used as the sole basis for treatment or other patient management decisions. A negative result may occur with  improper specimen collection/handling, submission of specimen other than nasopharyngeal swab, presence of viral mutation(s) within the areas targeted by this assay, and inadequate number of viral copies (<131 copies/mL). A negative result must be combined with clinical observations, patient history, and epidemiological information. The expected result is Negative. Fact Sheet for Patients:  PinkCheek.be Fact Sheet for Healthcare Providers:  GravelBags.it This test is not yet ap proved or cleared by the [place] FDA and  has been authorized for detection and/or diagnosis of SARS-CoV-2 by FDA under an Emergency Use Authorization (EUA). This EUA will remain  in effect (meaning this test can be used) for the duration of the COVID-19 declaration under Section 564(b)(1) of the Act, 21 U.S.C. section 360bbb-3(b)(1), unless the authorization is terminated or revoked sooner.    Influenza A by PCR NEGATIVE NEGATIVE Final   Influenza B by PCR NEGATIVE NEGATIVE Final    Comment: (NOTE) The Xpert Xpress SARS-CoV-2/FLU/RSV assay is intended as an aid in  the diagnosis of influenza from Nasopharyngeal swab specimens and  should not be used as a sole basis for treatment. Nasal washings and  aspirates are unacceptable for Xpert Xpress SARS-CoV-2/FLU/RSV  testing. Fact Sheet for Patients: PinkCheek.be Fact Sheet for Healthcare Providers: GravelBags.it This test is not yet approved or cleared by the [place] FDA and  has been authorized for detection and/or diagnosis of SARS-CoV-2 by  FDA under an Emergency Use Authorization (EUA). This EUA will remain  in effect (meaning this test can be used) for the duration of the  Covid-19 declaration under Section 564(b)(1) of the Act, 21  U.S.C. section 360bbb-3(b)(1), unless the authorization is  terminated or revoked. Performed at [facility], [place] [address] [place], [place] [address]      Labs: CBC: Recent Labs  Lab  03/09/19 0444 03/09/19 0444 03/10/19 0343 03/11/19 0350 03/12/19 0423 03/13/19 0407 03/14/19 0441  WBC 5.8   < > 4.0 3.9* 4.6 2.9* 2.7*  NEUTROABS 3.6  --  2.7 2.4 2.4 1.4*  --   HGB 11.7*   < > 10.8* 10.8* 11.1* 9.3* 9.4*  HCT 40.2   < > 37.6 38.2 38.9 33.6* 34.5*  MCV 90.7   < > 93.5 93.9 94.0 96.3 98.0  PLT 727*   < > 765* 972* 1,179* 987* 955*   < > = values in this interval not displayed.    Basic Metabolic Panel: Recent Labs  Lab 03/10/19 0343 03/11/19 0350 03/12/19 0423 03/13/19 0407 03/14/19 0441  NA 149* 146* 146* 146* 145  K 3.9 3.8 4.2 4.1 4.1  CL 118* 115* 115* 116* 115*  CO2 23 23 22 26 23   GLUCOSE 65* 76 137* 94 70  BUN 24* 23 20 21 21   CREATININE 1.14* 1.03* 1.00 1.11* 0.96  CALCIUM 8.0* 8.1* 8.2* 8.0* 8.0*  MG 2.1 2.2 2.6*  --   --   PHOS  --  3.1  --   --   --     Liver Function Tests: Recent Labs  Lab 03/09/19 0444 03/10/19 0343 03/11/19 0350 03/12/19 0423 03/13/19 0407  AST 31 33 36 32 25  ALT 16 18 19 19 16   ALKPHOS 62 62 59 64 59  BILITOT 1.0 0.7 0.5 0.2* 0.6  PROT 5.6* 5.4* 5.8* 5.7* 5.4*  ALBUMIN 2.3* 2.1* 2.3* 2.2* 2.0*    Anemia work up Recent Labs    03/12/19 0423  VITAMINB12 1,480*  FOLATE 10.2  FERRITIN 185  TIBC 149*  IRON 33    Urinalysis    Component Value Date/Time   COLORURINE YELLOW 03/08/2019 1724   APPEARANCEUR HAZY (A) 03/08/2019 1724   LABSPEC 1.016 03/08/2019 1724   PHURINE 6.0 03/08/2019 1724   GLUCOSEU NEGATIVE  03/08/2019 1724   GLUCOSEU NEGATIVE 01/19/2017 1346   HGBUR SMALL (A) 03/08/2019 1724   BILIRUBINUR NEGATIVE 03/08/2019 1724   BILIRUBINUR neg 03/09/2013 1447   KETONESUR NEGATIVE 03/08/2019 1724   PROTEINUR >=300 (A) 03/08/2019 1724   UROBILINOGEN 0.2 01/19/2017 1346   NITRITE NEGATIVE 03/08/2019 1724   LEUKOCYTESUR NEGATIVE 03/08/2019 1724   I discussed in detail with patient's daughter via phone, updated care and answered questions.  Updated her that patient will be discharged to SNF today.  She verbalized understanding and was appreciative of the call.   Time coordinating discharge: 45 minutes  SIGNED:  Vernell Leep, MD, [place], [facility]. [facility]  To contact the attending provider between 7A-7P or the covering provider during after hours 7P-7A, please log into the web site [email or web address] and access using universal [place] password for that web site. If you do not have the password, please call the hospital operator.

## 2019-03-14 NOTE — Progress Notes (Signed)
Spoke with daughter about discharge plans. SRP, RN

## 2019-03-14 NOTE — TOC Progression Note (Addendum)
Transition of Care [facility]) - Progression Note    Patient Details  Name: Angela [name] MRN: [id number] Date of Birth: [birth year]  Transition of Care [facility] CM/SW Contact  Purcell Mouton, RN Phone Number: 03/14/2019, 2:59 PM  Clinical Narrative:    Have insurance authorization [id number], [place] reference [id number] for 3/10-3/12 at SNF. Pt has bed at [facility] Room [phone]. Waiting for discharge summary and discharge orders.    Expected Discharge Plan: [place] Barriers to Discharge: No Barriers Identified  Expected Discharge Plan and Services Expected Discharge Plan: [place] arrangements for the past 2 months: Single Family Home                                       Social Determinants of Health (SDOH) Interventions    Readmission Risk Interventions No flowsheet data found.

## 2019-03-14 NOTE — Discharge Instructions (Signed)

## 2019-03-15 ENCOUNTER — Telehealth: Payer: Self-pay | Admitting: *Deleted

## 2019-03-15 DIAGNOSIS — M069 Rheumatoid arthritis, unspecified: Secondary | ICD-10-CM | POA: Diagnosis not present

## 2019-03-15 DIAGNOSIS — R531 Weakness: Secondary | ICD-10-CM | POA: Diagnosis not present

## 2019-03-15 DIAGNOSIS — R5381 Other malaise: Secondary | ICD-10-CM | POA: Diagnosis not present

## 2019-03-15 NOTE — Telephone Encounter (Signed)
Pt was on TCM report admitted 03/08/19 for further evaluation and management due to increased weakness, inability to transfer to wheelchair per usual as well as pain all over. Pt had fever 100.2 F, normal WBC, UA revealed pyuria and many bacteria, chest x-ray demonstrated bilateral hazy opacities with patchy infiltrates.  SARS-CoV-2 antigen and subsequent PCR were both negative. Pt dx w/Multifocal pneumonia, and D/C 03/14/19 to SNF.Marland KitchenAndee Poles

## 2019-03-16 DIAGNOSIS — L89610 Pressure ulcer of right heel, unstageable: Secondary | ICD-10-CM | POA: Diagnosis not present

## 2019-03-16 NOTE — Telephone Encounter (Signed)
F/u   Son returning call back to the nurse.

## 2019-03-19 NOTE — Telephone Encounter (Signed)
   Son calling to request home health for his mother Also requesting handicap placard form be completed  Please call

## 2019-03-19 NOTE — Telephone Encounter (Signed)
I decline since pt was d/c from hospital to SNF on 3/10

## 2019-03-20 NOTE — Telephone Encounter (Signed)
F/u  The daughter calling back with additional request for hoyer lift.   What type of DME ([place]) would like your provider to order? Hoyer lift  Who would like to get the DME from? [facility]   Last visit with PCP (>3 months requires and appointment for insurance to cover the cost = please schedule patient for visit to discuss medical necessity for DME)? 1.21.21

## 2019-03-20 NOTE — Telephone Encounter (Signed)
F/u  The patient daughter calling wants to go with  [facility] for service for her mom.   [phone]  Attention: Roselyn Reef

## 2019-03-21 DIAGNOSIS — M0549 Rheumatoid myopathy with rheumatoid arthritis of multiple sites: Secondary | ICD-10-CM | POA: Diagnosis not present

## 2019-03-21 DIAGNOSIS — M1990 Unspecified osteoarthritis, unspecified site: Secondary | ICD-10-CM | POA: Diagnosis not present

## 2019-03-21 DIAGNOSIS — R269 Unspecified abnormalities of gait and mobility: Secondary | ICD-10-CM | POA: Diagnosis not present

## 2019-03-21 DIAGNOSIS — M069 Rheumatoid arthritis, unspecified: Secondary | ICD-10-CM | POA: Diagnosis not present

## 2019-03-22 ENCOUNTER — Telehealth: Payer: Self-pay | Admitting: Internal Medicine

## 2019-03-22 DIAGNOSIS — C50919 Malignant neoplasm of unspecified site of unspecified female breast: Secondary | ICD-10-CM

## 2019-03-22 DIAGNOSIS — R269 Unspecified abnormalities of gait and mobility: Secondary | ICD-10-CM

## 2019-03-22 DIAGNOSIS — M069 Rheumatoid arthritis, unspecified: Secondary | ICD-10-CM

## 2019-03-22 NOTE — Telephone Encounter (Signed)
Done hardcopy to staff 

## 2019-03-22 NOTE — Telephone Encounter (Signed)
Faxed to [facility] @[phone]

## 2019-03-23 ENCOUNTER — Ambulatory Visit (INDEPENDENT_AMBULATORY_CARE_PROVIDER_SITE_OTHER): Payer: Medicare Other | Admitting: Internal Medicine

## 2019-03-23 DIAGNOSIS — F32A Depression, unspecified: Secondary | ICD-10-CM

## 2019-03-23 DIAGNOSIS — F411 Generalized anxiety disorder: Secondary | ICD-10-CM

## 2019-03-23 DIAGNOSIS — F329 Major depressive disorder, single episode, unspecified: Secondary | ICD-10-CM

## 2019-03-23 DIAGNOSIS — C50919 Malignant neoplasm of unspecified site of unspecified female breast: Secondary | ICD-10-CM | POA: Diagnosis not present

## 2019-03-23 DIAGNOSIS — M069 Rheumatoid arthritis, unspecified: Secondary | ICD-10-CM

## 2019-03-23 DIAGNOSIS — E785 Hyperlipidemia, unspecified: Secondary | ICD-10-CM

## 2019-03-23 DIAGNOSIS — R269 Unspecified abnormalities of gait and mobility: Secondary | ICD-10-CM

## 2019-03-23 NOTE — Progress Notes (Signed)
Patient ID: Angela [name], female   DOB: September 05, 1918, 86 y.o.   MRN: [id number]  Virtual Visit via Video Note  I connected with Angela [name] on 03/23/19 at  1:40 PM EDT by a video enabled telemedicine application and verified that I am speaking with the correct person using two identifiers.  Location: Patient: at home Provider: at office   I discussed the limitations of evaluation and management by telemedicine and the availability of in person appointments. The patient expressed understanding and agreed to proceed.  History of Present Illness: Here with son present who took her home after 1.5 days in the NH as was not getting the care he considered adequate; Pt denies chest pain, increased sob or doe, wheezing, orthopnea, PND, increased LE swelling, palpitations, dizziness or syncope.  Pt denies new neurological symptoms such as new headache, or facial or extremity weakness or numbness   Pt denies polydipsia, polyuria, but still clearly needs 24/7 care he cannot provide.  Is asking for at least [facility].  Denies worsening depressive symptoms, suicidal ideation, or panic  Denies fever, cough or dysuria Past Medical History:  Diagnosis Date  . Adenocarcinoma of breast ([place])   . Allergic rhinitis   . Anemia   . Anxiety   . Depression   . Rheumatoid arthritis(714.0)    Past Surgical History:  Procedure Laterality Date  . ANKLE SURGERY    . CATARACT EXTRACTION    . masectomy     left breast    reports that she has quit smoking. She has never used smokeless tobacco. She reports that she does not drink alcohol or use drugs. family history includes Arthritis in her son; Cancer in her father and sister; Diabetes in her mother. Allergies  Allergen Reactions  . Penicillins     REACTION: Question of penicillin allergy   Current Outpatient Medications on File Prior to Visit  Medication Sig Dispense Refill  . acetaminophen (TYLENOL) 325 MG tablet Take 2 tablets (650 mg total) by mouth every 6  (six) hours as needed for mild pain, moderate pain, fever or headache (fever >/= 101).    . citalopram (CELEXA) 10 MG tablet Take 1 tablet (10 mg total) by mouth daily. 90 tablet 3  . fluticasone (FLONASE) 50 MCG/ACT nasal spray Place 2 sprays into both nostrils daily as needed for allergies. SHAKE LIQUID AND USE 2 SPRAYS IN EACH NOSTRIL DAILY    . levothyroxine (SYNTHROID) 25 MCG tablet Take 1 tablet (25 mcg total) by mouth daily before breakfast. 90 tablet 3  . metoprolol tartrate (LOPRESSOR) 25 MG tablet Take 1 tablet (25 mg total) by mouth 2 (two) times daily.    . Vitamin D, Ergocalciferol, (DRISDOL) 1.25 MG (50000 UT) CAPS capsule Take 1 capsule (50,000 Units total) by mouth every 7 (seven) days. (Patient not taking: Reported on 03/09/2019) 12 capsule 0   No current facility-administered medications on file prior to visit.    Observations/Objective: Alert, NAD, appropriate mood and affect, resps normal, cn 2-12 intact, moves all 4s, no visible rash or swelling Lab Results  Component Value Date   WBC 2.7 (L) 03/14/2019   HGB 9.4 (L) 03/14/2019   HCT 34.5 (L) 03/14/2019   PLT 955 (HH) 03/14/2019   GLUCOSE 70 03/14/2019   CHOL 98 08/11/2018   TRIG 102.0 08/11/2018   HDL 26.30 (L) 08/11/2018   LDLCALC 52 08/11/2018   ALT 16 03/13/2019   AST 25 03/13/2019   NA 145 03/14/2019   K 4.1 03/14/2019  CL 115 (H) 03/14/2019   CREATININE 0.96 03/14/2019   BUN 21 03/14/2019   CO2 23 03/14/2019   TSH 4.063 03/08/2019   INR 1.4 (H) 03/08/2019   Assessment and Plan: See notes  Follow Up Instructions: See notes   I discussed the assessment and treatment plan with the patient. The patient was provided an opportunity to ask questions and all were answered. The patient agreed with the plan and demonstrated an understanding of the instructions.   The patient was advised to call back or seek an in-person evaluation if the symptoms worsen or if the condition fails to improve as  anticipated.   Cathlean Cower, MD

## 2019-03-25 ENCOUNTER — Encounter: Payer: Self-pay | Admitting: Internal Medicine

## 2019-03-25 NOTE — Assessment & Plan Note (Addendum)
stable overall by history and exam, recent data reviewed with pt, and pt to continue medical treatment as before,  to f/u any worsening symptoms or concerns,  I spent 32 minutes in preparing to see the patient by review of recent labs, imaging and procedures, obtaining and reviewing separately obtained history, communicating with the patient and family or caregiver, ordering medications, tests or procedures, and documenting clinical information in the EHR including the differential Dx, treatment, and any further evaluation and other management of RA, HLD, depression, anxiety

## 2019-03-25 NOTE — Assessment & Plan Note (Signed)
stable overall by history and exam, recent data reviewed with pt, and pt to continue medical treatment as before,  to f/u any worsening symptoms or concerns  

## 2019-03-25 NOTE — Patient Instructions (Signed)
;  Please continue all other medications as before, and refills have been done if requested.  Please have the pharmacy call with any other refills you may need.  Please keep your appointments with your specialists as you may have planned  You will be contacted regarding the referral for: Home health with RN, PT and aide

## 2019-03-26 NOTE — Telephone Encounter (Signed)
Received a msg from [facility] stating they can't do DME w/o HH referral. Sent them the [facility] referral and they are unable to take her because of her insurance.  I'm sending [facility] referral elsewhere.  You will probably need to send DME elsewhere  Thank you

## 2019-03-27 NOTE — Telephone Encounter (Signed)
Handicapped form mailed to patient @ [place], [place]

## 2019-03-30 ENCOUNTER — Encounter (HOSPITAL_COMMUNITY): Payer: Self-pay

## 2019-03-30 ENCOUNTER — Emergency Department (HOSPITAL_COMMUNITY): Payer: Medicare Other

## 2019-03-30 ENCOUNTER — Inpatient Hospital Stay (HOSPITAL_COMMUNITY)
Admission: EM | Admit: 2019-03-30 | Discharge: 2019-04-14 | DRG: 871 | Disposition: A | Discharge status: Skilled Nursing Facility | Payer: Medicare Other | Source: Skilled Nursing Facility | Attending: Internal Medicine | Admitting: Internal Medicine

## 2019-03-30 ENCOUNTER — Inpatient Hospital Stay (HOSPITAL_COMMUNITY): Payer: Medicare Other

## 2019-03-30 ENCOUNTER — Other Ambulatory Visit: Payer: Self-pay

## 2019-03-30 DIAGNOSIS — A419 Sepsis, unspecified organism: Secondary | ICD-10-CM | POA: Diagnosis not present

## 2019-03-30 DIAGNOSIS — Z79899 Other long term (current) drug therapy: Secondary | ICD-10-CM | POA: Diagnosis not present

## 2019-03-30 DIAGNOSIS — R509 Fever, unspecified: Secondary | ICD-10-CM

## 2019-03-30 DIAGNOSIS — I2699 Other pulmonary embolism without acute cor pulmonale: Secondary | ICD-10-CM | POA: Diagnosis not present

## 2019-03-30 DIAGNOSIS — Z515 Encounter for palliative care: Secondary | ICD-10-CM | POA: Diagnosis not present

## 2019-03-30 DIAGNOSIS — E876 Hypokalemia: Secondary | ICD-10-CM | POA: Diagnosis not present

## 2019-03-30 DIAGNOSIS — M2550 Pain in unspecified joint: Secondary | ICD-10-CM | POA: Diagnosis not present

## 2019-03-30 DIAGNOSIS — F0390 Unspecified dementia without behavioral disturbance: Secondary | ICD-10-CM | POA: Diagnosis present

## 2019-03-30 DIAGNOSIS — H9193 Unspecified hearing loss, bilateral: Secondary | ICD-10-CM | POA: Diagnosis present

## 2019-03-30 DIAGNOSIS — Z7409 Other reduced mobility: Secondary | ICD-10-CM | POA: Diagnosis not present

## 2019-03-30 DIAGNOSIS — E039 Hypothyroidism, unspecified: Secondary | ICD-10-CM | POA: Diagnosis not present

## 2019-03-30 DIAGNOSIS — J9601 Acute respiratory failure with hypoxia: Secondary | ICD-10-CM | POA: Diagnosis not present

## 2019-03-30 DIAGNOSIS — J90 Pleural effusion, not elsewhere classified: Secondary | ICD-10-CM | POA: Diagnosis not present

## 2019-03-30 DIAGNOSIS — E162 Hypoglycemia, unspecified: Secondary | ICD-10-CM | POA: Diagnosis not present

## 2019-03-30 DIAGNOSIS — Z809 Family history of malignant neoplasm, unspecified: Secondary | ICD-10-CM

## 2019-03-30 DIAGNOSIS — J9600 Acute respiratory failure, unspecified whether with hypoxia or hypercapnia: Secondary | ICD-10-CM | POA: Diagnosis present

## 2019-03-30 DIAGNOSIS — N179 Acute kidney failure, unspecified: Secondary | ICD-10-CM | POA: Diagnosis present

## 2019-03-30 DIAGNOSIS — Z66 Do not resuscitate: Secondary | ICD-10-CM | POA: Diagnosis present

## 2019-03-30 DIAGNOSIS — I2693 Single subsegmental pulmonary embolism without acute cor pulmonale: Secondary | ICD-10-CM | POA: Diagnosis not present

## 2019-03-30 DIAGNOSIS — U071 COVID-19: Secondary | ICD-10-CM | POA: Diagnosis not present

## 2019-03-30 DIAGNOSIS — D473 Essential (hemorrhagic) thrombocythemia: Secondary | ICD-10-CM | POA: Diagnosis not present

## 2019-03-30 DIAGNOSIS — D709 Neutropenia, unspecified: Secondary | ICD-10-CM | POA: Diagnosis not present

## 2019-03-30 DIAGNOSIS — E870 Hyperosmolality and hypernatremia: Secondary | ICD-10-CM | POA: Diagnosis present

## 2019-03-30 DIAGNOSIS — L89152 Pressure ulcer of sacral region, stage 2: Secondary | ICD-10-CM | POA: Diagnosis present

## 2019-03-30 DIAGNOSIS — Z7189 Other specified counseling: Secondary | ICD-10-CM | POA: Diagnosis not present

## 2019-03-30 DIAGNOSIS — E785 Hyperlipidemia, unspecified: Secondary | ICD-10-CM | POA: Diagnosis not present

## 2019-03-30 DIAGNOSIS — M24561 Contracture, right knee: Secondary | ICD-10-CM | POA: Diagnosis not present

## 2019-03-30 DIAGNOSIS — A4189 Other specified sepsis: Secondary | ICD-10-CM | POA: Diagnosis not present

## 2019-03-30 DIAGNOSIS — I340 Nonrheumatic mitral (valve) insufficiency: Secondary | ICD-10-CM | POA: Diagnosis not present

## 2019-03-30 DIAGNOSIS — I350 Nonrheumatic aortic (valve) stenosis: Secondary | ICD-10-CM | POA: Diagnosis not present

## 2019-03-30 DIAGNOSIS — M069 Rheumatoid arthritis, unspecified: Secondary | ICD-10-CM | POA: Diagnosis not present

## 2019-03-30 DIAGNOSIS — L89159 Pressure ulcer of sacral region, unspecified stage: Secondary | ICD-10-CM | POA: Diagnosis present

## 2019-03-30 DIAGNOSIS — R000 Tachycardia, unspecified: Secondary | ICD-10-CM | POA: Diagnosis not present

## 2019-03-30 DIAGNOSIS — E872 Acidosis: Secondary | ICD-10-CM | POA: Diagnosis not present

## 2019-03-30 DIAGNOSIS — Z87891 Personal history of nicotine dependence: Secondary | ICD-10-CM

## 2019-03-30 DIAGNOSIS — Z79890 Hormone replacement therapy (postmenopausal): Secondary | ICD-10-CM | POA: Diagnosis not present

## 2019-03-30 DIAGNOSIS — L89892 Pressure ulcer of other site, stage 2: Secondary | ICD-10-CM | POA: Diagnosis present

## 2019-03-30 DIAGNOSIS — Z681 Body mass index (BMI) 19 or less, adult: Secondary | ICD-10-CM | POA: Diagnosis not present

## 2019-03-30 DIAGNOSIS — E43 Unspecified severe protein-calorie malnutrition: Secondary | ICD-10-CM | POA: Diagnosis present

## 2019-03-30 DIAGNOSIS — R0689 Other abnormalities of breathing: Secondary | ICD-10-CM | POA: Diagnosis not present

## 2019-03-30 DIAGNOSIS — R531 Weakness: Secondary | ICD-10-CM | POA: Diagnosis not present

## 2019-03-30 DIAGNOSIS — K8020 Calculus of gallbladder without cholecystitis without obstruction: Secondary | ICD-10-CM | POA: Diagnosis not present

## 2019-03-30 DIAGNOSIS — I5033 Acute on chronic diastolic (congestive) heart failure: Secondary | ICD-10-CM | POA: Diagnosis not present

## 2019-03-30 DIAGNOSIS — I5031 Acute diastolic (congestive) heart failure: Secondary | ICD-10-CM

## 2019-03-30 DIAGNOSIS — Z853 Personal history of malignant neoplasm of breast: Secondary | ICD-10-CM

## 2019-03-30 DIAGNOSIS — M24562 Contracture, left knee: Secondary | ICD-10-CM | POA: Diagnosis not present

## 2019-03-30 DIAGNOSIS — R918 Other nonspecific abnormal finding of lung field: Secondary | ICD-10-CM | POA: Diagnosis not present

## 2019-03-30 DIAGNOSIS — Z743 Need for continuous supervision: Secondary | ICD-10-CM | POA: Diagnosis not present

## 2019-03-30 DIAGNOSIS — Z880 Allergy status to penicillin: Secondary | ICD-10-CM | POA: Diagnosis not present

## 2019-03-30 DIAGNOSIS — L89212 Pressure ulcer of right hip, stage 2: Secondary | ICD-10-CM | POA: Diagnosis present

## 2019-03-30 DIAGNOSIS — J1282 Pneumonia due to coronavirus disease 2019: Secondary | ICD-10-CM | POA: Diagnosis not present

## 2019-03-30 DIAGNOSIS — I959 Hypotension, unspecified: Secondary | ICD-10-CM | POA: Diagnosis not present

## 2019-03-30 DIAGNOSIS — Z431 Encounter for attention to gastrostomy: Secondary | ICD-10-CM

## 2019-03-30 DIAGNOSIS — Z833 Family history of diabetes mellitus: Secondary | ICD-10-CM

## 2019-03-30 DIAGNOSIS — R627 Adult failure to thrive: Secondary | ICD-10-CM | POA: Diagnosis present

## 2019-03-30 DIAGNOSIS — Z7401 Bed confinement status: Secondary | ICD-10-CM | POA: Diagnosis not present

## 2019-03-30 DIAGNOSIS — T68XXXA Hypothermia, initial encounter: Secondary | ICD-10-CM

## 2019-03-30 DIAGNOSIS — R1312 Dysphagia, oropharyngeal phase: Secondary | ICD-10-CM | POA: Diagnosis not present

## 2019-03-30 DIAGNOSIS — R6521 Severe sepsis with septic shock: Secondary | ICD-10-CM | POA: Diagnosis present

## 2019-03-30 DIAGNOSIS — M6281 Muscle weakness (generalized): Secondary | ICD-10-CM | POA: Diagnosis not present

## 2019-03-30 DIAGNOSIS — T68XXXD Hypothermia, subsequent encounter: Secondary | ICD-10-CM | POA: Diagnosis not present

## 2019-03-30 DIAGNOSIS — D75839 Thrombocytosis, unspecified: Secondary | ICD-10-CM

## 2019-03-30 LAB — COMPREHENSIVE METABOLIC PANEL
ALT: 18 U/L (ref 0–44)
AST: 32 U/L (ref 15–41)
Albumin: 2.6 g/dL — ABNORMAL LOW (ref 3.5–5.0)
Alkaline Phosphatase: 66 U/L (ref 38–126)
Anion gap: 11 (ref 5–15)
BUN: 27 mg/dL — ABNORMAL HIGH (ref 8–23)
CO2: 24 mmol/L (ref 22–32)
Calcium: 7.9 mg/dL — ABNORMAL LOW (ref 8.9–10.3)
Chloride: 113 mmol/L — ABNORMAL HIGH (ref 98–111)
Creatinine, Ser: 1.36 mg/dL — ABNORMAL HIGH (ref 0.44–1.00)
GFR calc Af Amer: 37 mL/min — ABNORMAL LOW (ref >60)
GFR calc non Af Amer: 32 mL/min — ABNORMAL LOW (ref >60)
Glucose, Bld: 120 mg/dL — ABNORMAL HIGH (ref 70–99)
Potassium: 3.7 mmol/L (ref 3.5–5.1)
Sodium: 148 mmol/L — ABNORMAL HIGH (ref 135–145)
Total Bilirubin: 0.4 mg/dL (ref 0.3–1.2)
Total Protein: 6.6 g/dL (ref 6.5–8.1)

## 2019-03-30 LAB — CBC WITH DIFFERENTIAL/PLATELET
Abs Immature Granulocytes: 0.13 10*3/uL — ABNORMAL HIGH (ref 0.00–0.07)
Basophils Absolute: 0.1 10*3/uL (ref 0.0–0.1)
Basophils Relative: 2 %
Eosinophils Absolute: 0 10*3/uL (ref 0.0–0.5)
Eosinophils Relative: 1 %
HCT: 34.7 % — ABNORMAL LOW (ref 36.0–46.0)
Hemoglobin: 10 g/dL — ABNORMAL LOW (ref 12.0–15.0)
Immature Granulocytes: 5 %
Lymphocytes Relative: 16 %
Lymphs Abs: 0.5 10*3/uL — ABNORMAL LOW (ref 0.7–4.0)
MCH: 27 pg (ref 26.0–34.0)
MCHC: 28.8 g/dL — ABNORMAL LOW (ref 30.0–36.0)
MCV: 93.5 fL (ref 80.0–100.0)
Monocytes Absolute: 0.5 10*3/uL (ref 0.1–1.0)
Monocytes Relative: 18 %
Neutro Abs: 1.7 10*3/uL (ref 1.7–7.7)
Neutrophils Relative %: 58 %
Platelets: 674 10*3/uL — ABNORMAL HIGH (ref 150–400)
RBC: 3.71 MIL/uL — ABNORMAL LOW (ref 3.87–5.11)
RDW: 22 % — ABNORMAL HIGH (ref 11.5–15.5)
WBC: 2.9 10*3/uL — ABNORMAL LOW (ref 4.0–10.5)
nRBC: 0 % (ref 0.0–0.2)

## 2019-03-30 LAB — URINALYSIS, ROUTINE W REFLEX MICROSCOPIC
Bilirubin Urine: NEGATIVE
Glucose, UA: NEGATIVE mg/dL
Ketones, ur: NEGATIVE mg/dL
Leukocytes,Ua: NEGATIVE
Nitrite: NEGATIVE
Protein, ur: 100 mg/dL — AB
Specific Gravity, Urine: 1.017 (ref 1.005–1.030)
pH: 5 (ref 5.0–8.0)

## 2019-03-30 LAB — TSH: TSH: 12.915 u[IU]/mL — ABNORMAL HIGH (ref 0.350–4.500)

## 2019-03-30 LAB — APTT: aPTT: 43 seconds — ABNORMAL HIGH (ref 24–36)

## 2019-03-30 LAB — RESPIRATORY PANEL BY RT PCR (FLU A&B, COVID)
Influenza A by PCR: NEGATIVE
Influenza B by PCR: NEGATIVE
SARS Coronavirus 2 by RT PCR: POSITIVE — AB

## 2019-03-30 LAB — PROCALCITONIN: Procalcitonin: 0.23 ng/mL

## 2019-03-30 LAB — PROTIME-INR
INR: 1.2 (ref 0.8–1.2)
Prothrombin Time: 15.5 seconds — ABNORMAL HIGH (ref 11.4–15.2)

## 2019-03-30 LAB — BRAIN NATRIURETIC PEPTIDE: B Natriuretic Peptide: 800.7 pg/mL — ABNORMAL HIGH (ref 0.0–100.0)

## 2019-03-30 LAB — LACTIC ACID, PLASMA
Lactic Acid, Venous: 3.1 mmol/L (ref 0.5–1.9)
Lactic Acid, Venous: 1.9 mmol/L (ref 0.5–1.9)

## 2019-03-30 MED ORDER — HEPARIN SODIUM (PORCINE) 5000 UNIT/ML IJ SOLN
5000.0000 [IU] | Freq: Three times a day (TID) | INTRAMUSCULAR | Status: DC
  Administered 2019-03-30: 5000 [IU] via SUBCUTANEOUS
  Filled 2019-03-30: qty 1

## 2019-03-30 MED ORDER — LEVOTHYROXINE SODIUM 25 MCG PO TABS
25.0000 ug | ORAL_TABLET | Freq: Every day | ORAL | Status: DC
  Administered 2019-03-31 – 2019-04-03 (×4): 25 ug via ORAL
  Filled 2019-03-30 (×4): qty 1

## 2019-03-30 MED ORDER — ACETAMINOPHEN 325 MG PO TABS
650.0000 mg | ORAL_TABLET | Freq: Four times a day (QID) | ORAL | Status: DC | PRN
  Administered 2019-04-08 – 2019-04-09 (×2): 650 mg via ORAL
  Filled 2019-03-30 (×3): qty 2

## 2019-03-30 MED ORDER — ACETAMINOPHEN 650 MG RE SUPP
650.0000 mg | Freq: Once | RECTAL | Status: AC
  Administered 2019-03-30: 650 mg via RECTAL
  Filled 2019-03-30: qty 1

## 2019-03-30 MED ORDER — BENZONATATE 100 MG PO CAPS: 100.0000 mg | ORAL_CAPSULE | Freq: Three times a day (TID) | ORAL | Status: DC | PRN

## 2019-03-30 MED ORDER — LACTATED RINGERS IV BOLUS (SEPSIS)
500.0000 mL | Freq: Once | INTRAVENOUS | Status: AC
  Administered 2019-03-30: 18:00:00 500 mL via INTRAVENOUS

## 2019-03-30 MED ORDER — ASCORBIC ACID 500 MG PO TABS
500.0000 mg | ORAL_TABLET | Freq: Every day | ORAL | Status: DC
  Administered 2019-03-31 – 2019-04-14 (×8): 500 mg via ORAL
  Filled 2019-03-30 (×13): qty 1

## 2019-03-30 MED ORDER — ZINC SULFATE 220 (50 ZN) MG PO CAPS
220.0000 mg | ORAL_CAPSULE | Freq: Every day | ORAL | Status: DC
  Administered 2019-03-31 – 2019-04-14 (×8): 220 mg via ORAL
  Filled 2019-03-30 (×13): qty 1

## 2019-03-30 MED ORDER — FLUTICASONE PROPIONATE 50 MCG/ACT NA SUSP: 2.0000 | Freq: Every day | NASAL | Status: DC | PRN

## 2019-03-30 MED ORDER — VANCOMYCIN HCL IN DEXTROSE 1-5 GM/200ML-% IV SOLN
1000.0000 mg | Freq: Once | INTRAVENOUS | Status: AC
  Administered 2019-03-30: 1000 mg via INTRAVENOUS
  Filled 2019-03-30: qty 200

## 2019-03-30 MED ORDER — SODIUM CHLORIDE 0.9 % IV SOLN
2.0000 g | Freq: Once | INTRAVENOUS | Status: AC
  Administered 2019-03-30: 2 g via INTRAVENOUS
  Filled 2019-03-30: qty 2

## 2019-03-30 MED ORDER — DEXAMETHASONE SODIUM PHOSPHATE 10 MG/ML IJ SOLN
6.0000 mg | INTRAMUSCULAR | Status: DC
  Administered 2019-03-31 – 2019-04-10 (×12): 6 mg via INTRAVENOUS
  Filled 2019-03-30 (×11): qty 1

## 2019-03-30 MED ORDER — VANCOMYCIN VARIABLE DOSE PER UNSTABLE RENAL FUNCTION (PHARMACIST DOSING): Status: DC

## 2019-03-30 MED ORDER — SODIUM CHLORIDE 0.9 % IV SOLN
2.0000 g | INTRAVENOUS | Status: AC
  Administered 2019-03-31 – 2019-04-05 (×6): 2 g via INTRAVENOUS
  Filled 2019-03-30 (×7): qty 2

## 2019-03-30 MED ORDER — ACETAMINOPHEN 650 MG RE SUPP: 650.0000 mg | Freq: Four times a day (QID) | RECTAL | Status: DC | PRN

## 2019-03-30 MED ORDER — METRONIDAZOLE IN NACL 5-0.79 MG/ML-% IV SOLN
500.0000 mg | Freq: Once | INTRAVENOUS | Status: AC
  Administered 2019-03-30: 19:00:00 500 mg via INTRAVENOUS
  Filled 2019-03-30: qty 100

## 2019-03-30 MED ORDER — LACTATED RINGERS IV BOLUS (SEPSIS): 1000.0000 mL | Freq: Once | INTRAVENOUS | Status: DC

## 2019-03-30 MED ORDER — LACTATED RINGERS IV BOLUS (SEPSIS): 250.0000 mL | Freq: Once | INTRAVENOUS | Status: DC

## 2019-03-30 NOTE — Progress Notes (Signed)
A consult was received from an ED physician for vancomycin and cefepime per pharmacy dosing (for an indication other than meningitis). The patient's profile has been reviewed for ht/wt/allergies/indication/available labs. A one time order has been placed for the above antibiotics.  Further antibiotics/pharmacy consults should be ordered by admitting physician if indicated.                       Reuel Boom, PharmD, BCPS [phone] 03/30/2019, 6:19 PM

## 2019-03-30 NOTE — Progress Notes (Signed)
Pharmacy Antibiotic Note  Angela [name] is a 86 y.o. female admitted on 03/30/2019 with sepsis.  Pharmacy has been consulted for vancomycin and cefepime dosing. First doses given in ED  Plan:  Based on estimated kinetics; vanc will take > 48 hr to reach acceptable trough; will monitor SCr and check random levels as needed  Cefepime 2 g IV q24 hr   Height: 5\' 3"  (160 cm) Weight: 89 lb 15.2 oz (40.8 kg) IBW/kg (Calculated) : 52.4  Temp (24hrs), Avg:99.6 F (37.6 C), Min:97.9 F (36.6 C), Max:101.2 F (38.4 C)  Recent Labs  Lab 03/30/19 1730 03/30/19 1931  WBC 2.9*  --   CREATININE 1.36*  --   LATICACIDVEN 3.1* 1.9    Estimated Creatinine Clearance: 14.5 mL/min (A) (by C-G formula based on SCr of 1.36 mg/dL (H)).    Allergies  Allergen Reactions  . Penicillins     REACTION: Question of penicillin allergy Did it involve swelling of the face/tongue/throat, SOB, or low BP? Unknown Did it involve sudden or severe rash/hives, skin peeling, or any reaction on the inside of your mouth or nose? Unknown Did you need to seek medical attention at a hospital or doctor's office? Unknown When did it last happen?Unknown If all above answers are "NO", may proceed with cephalosporin use.     Antimicrobials this admission: 3/26 vanc >>  3/26 cefepime >>   Dose adjustments this admission: n/a  Microbiology results: 3/26 BCx: sent 3/26 UCx: sent    Thank you for allowing pharmacy to be a part of this patient's care.  Gailyn Crook A 03/30/2019 9:19 PM

## 2019-03-30 NOTE — ED Notes (Signed)
Critical Value Lactic Acid 3.1

## 2019-03-30 NOTE — ED Triage Notes (Signed)
86 yo from home. Family called 911 for abd pain which pt denies. EMS noted 2 wounds on backside and in vaginal area, +fever of 103.1 en route. BP 88/60, HR 130, Tachypneic 40/min. Sat 98 RA. Pt c/o back pain. Recent admission for sepsis.

## 2019-03-30 NOTE — ED Notes (Signed)
Patient placed on [place] 2 liters due to 02 desats to 88% RA.

## 2019-03-30 NOTE — ED Notes (Signed)
[place] (Son) at bedside.

## 2019-03-30 NOTE — ED Provider Notes (Addendum)
[facility] DEPT Provider Note   CSN: [id number] Arrival date & time: 03/30/19  1717     History Chief Complaint  Patient presents with  . Code Sepsis  . Wound Check    Angela [name] is a 86 y.o. female.  HPI Patient was discharged in the hospital 3\10\21.  She had sepsis and bacterial pneumonia.  Covid testing have been negative.  Patient does have advanced age, debility and adult failure to thrive.  She was discharged with some declined base line function prehospitalization and overall guarded prognosis.  Family called EMS today because of patient had complained of abdominal pain.  Shortly after arrival to the emergency department, patient had a very large brown stool per nursing staff.  No blood visualized.  They also had concerns for vaginal wounds or lesions that have been identified by home health.  At the time that EMS arrived, patient was found to be febrile, tachycardic and mildly hypotensive.  Patient son reports that she chronically has low blood pressure.  She reports she frequently complains of discomfort in her genital or vaginal area.  Son reports they are aware of the wounds on her sacrum and caring for those.  Once at the hospital, there was no further complaint of abdominal pain.  Patient is a difficult historian due to condition was advanced age and acute illness.    Past Medical History:  Diagnosis Date  . Adenocarcinoma of breast ([place])   . Allergic rhinitis   . Anemia   . Anxiety   . Depression   . Rheumatoid arthritis(714.0)     Patient Active Problem List   Diagnosis Date Noted  . Pressure injury of skin 03/10/2019  . Generalized weakness 03/08/2019  . Acute lower UTI 03/08/2019  . Multifocal pneumonia 03/08/2019  . Abnormal TSH 01/28/2019  . Hypokalemia 01/28/2019  . Vitamin D deficiency 01/28/2019  . Gait disorder 07/18/2017  . Bilateral hearing loss 04/28/2017  . Acquired deformity of toenail 04/28/2017  . Nocturia  04/28/2017  . Encounter for well adult exam with abnormal findings 01/19/2017  . Neuralgia, postherpetic 05/27/2016  . Constipation 05/05/2016  . Shingles 05/05/2016  . OAB (overactive bladder) 05/05/2016  . Contusion 02/26/2016  . Impacted cerumen of both ears 09/25/2015  . Neutropenia ([place]) 09/25/2015  . Bilateral knee contractures 10/26/2013  . Sinusitis, acute 10/26/2013  . Dysuria 04/20/2012  . Other malaise and fatigue 04/20/2012  . Decubitus ulcers 08/10/2011  . Vision loss of left eye 04/06/2011  . Atrophic vaginitis 09/12/2010  . CHOLELITHIASIS, SYMPTOMATIC 10/06/2007  . HLD (hyperlipidemia) 06/16/2007  . ANEMIA-NOS 06/16/2007  . Anxiety state 06/16/2007  . Depression 06/16/2007  . SKIN LESIONS, MULTIPLE 06/16/2007  . ADENOCARCINOMA, BREAST 11/12/2006  . MASTECTOMY, LEFT, HX OF 11/12/2006  . Allergic rhinitis 10/05/2006  . Rheumatoid arthritis ([place]) 10/05/2006    Past Surgical History:  Procedure Laterality Date  . ANKLE SURGERY    . CATARACT EXTRACTION    . masectomy     left breast     OB History    Gravida  9   Para  8   Term      Preterm      AB  1   Living  8     SAB  1   TAB      Ectopic      Multiple      Live Births              Family History  Problem Relation Age of Onset  . Diabetes Mother   . Cancer Father   . Cancer Sister   . Arthritis Son     Social History   Tobacco Use  . Smoking status: Former Research scientist (life sciences)  . Smokeless tobacco: Never Used  Substance Use Topics  . Alcohol use: No  . Drug use: No    Home Medications Prior to Admission medications   Medication Sig Start Date End Date Taking? Authorizing Provider  acetaminophen (TYLENOL) 325 MG tablet Take 2 tablets (650 mg total) by mouth every 6 (six) hours as needed for mild pain, moderate pain, fever or headache (fever >/= 101). 03/14/19  Yes Hongalgi, Lenis Dickinson, MD  citalopram (CELEXA) 10 MG tablet Take 1 tablet (10 mg total) by mouth daily. 01/25/19 01/25/20 Yes  [name], MD  fluticasone [facility]) 50 MCG/ACT nasal spray Place 2 sprays into both nostrils daily as needed for allergies. SHAKE LIQUID AND USE 2 SPRAYS IN EACH NOSTRIL DAILY 03/14/19  Yes Hongalgi, Lenis Dickinson, MD  levothyroxine (SYNTHROID) 25 MCG tablet Take 1 tablet (25 mcg total) by mouth daily before breakfast. 08/11/18  Yes [name], MD  metoprolol tartrate (LOPRESSOR) 25 MG tablet Take 1 tablet (25 mg total) by mouth 2 (two) times daily. 03/14/19  Yes Hongalgi, Lenis Dickinson, MD  Vitamin D, Ergocalciferol, (DRISDOL) 1.25 MG (50000 UT) CAPS capsule Take 1 capsule (50,000 Units total) by mouth every 7 (seven) days. Patient not taking: Reported on 03/09/2019 08/11/18   [name], MD    Allergies    Penicillins  Review of Systems   Review of Systems Level 5 caveat cannot obtain review of systems due to patient condition. Physical Exam Updated Vital Signs BP 118/60   Pulse 86   Temp (!) 101.2 F (38.4 C) (Rectal)   Resp 19   Ht 5\' 3"  (1.6 m)   Wt 40.8 kg   SpO2 100%   BMI 15.93 kg/m   Physical Exam Constitutional:      Comments: Patient appears very debilitated and is predominantly lying on her left side keeping her legs slightly flexed.  Some tachypnea but no significant respiratory distress.  HENT:     Head: Normocephalic and atraumatic.     Mouth/Throat:     Pharynx: Oropharynx is clear.  Eyes:     Extraocular Movements: Extraocular movements intact.  Cardiovascular:     Comments: Heart is regular.  Borderline tachycardic. Pulmonary:     Comments: Mild tachypnea but no significant respiratory distress.  Right lung is grossly clear.  Left lung has crackles from the mid lung field to the base. Abdominal:     Comments: Abdomen seems slightly distended.  No guarding.  Patient does not seem to have significant pain with palpation.  Genitourinary:    Comments: Multiple nonthrombosed hemorrhoids.  Rectum is clear to digital palpation.  Trace brown stool in the vault.  No blood.   Sacral pressure wound.  See attached image.  Inspection of the vulva and vaginal introitus do not reveal any appearance of injury or wound.  Though not shown in the image, the vaginal introitus did appear normal without any specific wound or drainage (This was a significant concern for the family because reportedly the home health nurse identified an issue.)  Musculoskeletal:     Comments: Patient has significant stiffness of the extremities.  She predominately keeps both legs slightly flexed.  Is very difficult to straighten or moves the patient.  2-3+ pitting edema of  both feet.  Nails of the toes are long.  But no wounds or appearance of cellulitis of the feet or lower legs.  Skin:    General: Skin is warm and dry.  Neurological:     Comments: Patient has limited verbal communication.  She does respond to painful stimulus and moan and try to move her extremity during IV start.  She is not really following any commands.  She is awake.             ED Results / Procedures / Treatments   Labs (all labs ordered are listed, but only abnormal results are displayed) Labs Reviewed  COMPREHENSIVE METABOLIC PANEL - Abnormal; Notable for the following components:      Result Value   Sodium 148 (*)    Chloride 113 (*)    Glucose, Bld 120 (*)    BUN 27 (*)    Creatinine, Ser 1.36 (*)    Calcium 7.9 (*)    Albumin 2.6 (*)    GFR calc non Af Amer 32 (*)    GFR calc Af Amer 37 (*)    All other components within normal limits  LACTIC ACID, PLASMA - Abnormal; Notable for the following components:   Lactic Acid, Venous 3.1 (*)    All other components within normal limits  CBC WITH DIFFERENTIAL/PLATELET - Abnormal; Notable for the following components:   WBC 2.9 (*)    RBC 3.71 (*)    Hemoglobin 10.0 (*)    HCT 34.7 (*)    MCHC 28.8 (*)    RDW 22.0 (*)    Platelets 674 (*)    Lymphs Abs 0.5 (*)    Abs Immature Granulocytes 0.13 (*)    All other components within normal limits    PROTIME-INR - Abnormal; Notable for the following components:   Prothrombin Time 15.5 (*)    All other components within normal limits  URINALYSIS, ROUTINE W REFLEX MICROSCOPIC - Abnormal; Notable for the following components:   Hgb urine dipstick SMALL (*)    Protein, ur 100 (*)    Bacteria, UA RARE (*)    All other components within normal limits  APTT - Abnormal; Notable for the following components:   aPTT 43 (*)    All other components within normal limits  CULTURE, BLOOD (ROUTINE X 2)  CULTURE, BLOOD (ROUTINE X 2)  RESPIRATORY PANEL BY RT PCR (FLU A&B, COVID)  URINE CULTURE  LACTIC ACID, PLASMA    EKG None  Radiology DG Chest Port 1 View  Result Date: 03/30/2019 CLINICAL DATA:  Abdominal pain. EXAM: PORTABLE CHEST 1 VIEW COMPARISON:  March 14, 2019 FINDINGS: Moderate to marked severity left basilar atelectasis and/or infiltrate is seen. This is stable in severity when compared to the prior study. A 6 mm calcified nodular opacity is seen overlying the mid right lung. A small left pleural effusion is noted which is mildly increased in size. No pneumothorax is identified. The cardiac silhouette is mildly enlarged and unchanged in size. Radiopaque surgical clips are seen along the lateral aspect of the mid left hemithorax. Degenerative changes seen throughout the thoracic spine with stable moderate to marked severity dextroscoliosis. IMPRESSION: 1. Moderate to marked severity left basilar atelectasis and/or infiltrate. This is stable in severity when compared to the prior study. 2. Small left pleural effusion which is mildly increased in size. Electronically Signed   By: Virgina Norfolk M.D.   On: 03/30/2019 18:40    Procedures Procedures (including critical care time)  CRITICAL CARE Performed by: Charlesetta Shanks   Total critical care time: 30 minutes  Critical care time was exclusive of separately billable procedures and treating other patients.  Critical care was necessary  to treat or prevent imminent or life-threatening deterioration.  Critical care was time spent personally by me on the following activities: development of treatment plan with patient and/or surrogate as well as nursing, discussions with consultants, evaluation of patient's response to treatment, examination of patient, obtaining history from patient or surrogate, ordering and performing treatments and interventions, ordering and review of laboratory studies, ordering and review of radiographic studies, pulse oximetry and re-evaluation of patient's condition. Medications Ordered in ED Medications  metroNIDAZOLE (FLAGYL) IVPB 500 mg (500 mg Intravenous New Bag/Given 03/30/19 1845)  vancomycin (VANCOCIN) IVPB 1000 mg/200 mL premix (has no administration in time range)  lactated ringers bolus 500 mL (0 mLs Intravenous Stopped 03/30/19 1857)  ceFEPIme (MAXIPIME) 2 g in sodium chloride 0.9 % 100 mL IVPB (0 g Intravenous Stopped 03/30/19 1845)  acetaminophen (TYLENOL) suppository 650 mg (650 mg Rectal Given 03/30/19 1812)    ED Course  I have reviewed the triage vital signs and the nursing notes.  Pertinent labs & imaging results that were available during my care of the patient were reviewed by me and considered in my medical decision making (see chart for details).  Clinical Course as of Apr 10 2333  Fri Mar 30, 2019  2015 Consult: Reviewed with Dr. Marlowe Sax excepts for admission.   [MP]    Clinical Course User Index [MP] Charlesetta Shanks, MD   MDM Rules/Calculators/A&P                     Patient with significantly advanced age and recent hospitalization presents with high fever, tachycardia and hypotension.  Sepsis protocol initiated.  On physical exam there is crackle in the left lung field.  Did not have Covid during her prior hospitalization.  Retesting has been done.  She is restarted on a empiric antibiotic regimen for sepsis of unknown source.  IV fluid resuscitation was used judiciously due  to patient already having peripheral edema of the feet.  He was given a 500 cc bolus of lactated Ringer.  Pressures now 121/63.  Lactate is initially mildly elevated at 3.1.  Will recheck after light hydration.  Will plan for readmission. Final Clinical Impression(s) / ED Diagnoses Final diagnoses:  Fever, unspecified fever cause  Sepsis, due to unspecified organism, unspecified whether acute organ dysfunction present [facility])    Rx / DC Orders ED Discharge Orders    None       Charlesetta Shanks, MD 03/30/19 [id number]    Charlesetta Shanks, MD 04/11/19 [phone]

## 2019-03-30 NOTE — ED Notes (Signed)
Call received from pt daughter [name] [phone]; inquires re: pt status/location, would like to discuss previous hospitalization in relation to current ED visit re: skin sores/lesions. Advised pt daughter RN/MD will be advised of questions/concerns, will reach out for f/u when possible. Per pt daughter request registration contact info provided to update emergency contacts for pt records. RN advised. [employer]

## 2019-03-30 NOTE — H&P (Addendum)
History and Physical    Angela [name] [id number] DOB: February 19, 1918 DOA: 03/30/2019  PCP: [name], MD Patient coming from: Home  Chief Complaint: Abdominal pain  HPI: Angela [name] is a 86 y.o. female with medical history significant of breast status postmastectomy, anemia, anxiety, depression, rheumatoid arthritis, hypothyroidism, recent hospital admission 03/08/2019-03/14/2019 (advanced age/debility/failure to thrive, acute hypoxic respiratory failure secondary to multifocal pneumonia, COVID-19 negative, hyponatremia, severe protein calorie malnutrition, hypoglycemia, AKI, acute diastolic CHF, thrombocytosis, AVNRT) presenting to the ED via EMS for evaluation of abdominal pain.  Family called EMS for abdominal pain.  Febrile in route with temperature 103.1 F.  Hypotensive with blood pressure 88/60 and tachycardic with heart rate 130.  Tachypneic with respiratory rate 40.  Oxygen saturation 98% on room air.  Patient able to give limited history due to her advanced age and acute illness.  States she sometimes feels short of breath and sometimes experiences abdominal pain.  Reports history of nausea but no vomiting.  She is having bowel movements.  Denies chest pain.  Son at bedside stated that the patient's caregiver was concerned about a wound in her vaginal area.  He called the caregiver on his phone while I was present in the room and I spoke to her.  She stated that the patient has a wound in her buttock/sacral region and has been complaining of pain/burning in that area.  States she has not noticed any wound in the vaginal/genital area.  ED Course: Febrile with temperature 101.2 F.  Slightly tachycardic and tachypneic.  Blood pressure improved after a 500 cc fluid bolus.  Oxygen saturation 86% on room air, placed on 2 L supplemental oxygen. Labs showing leukocytosis with WBC count 2.9, consistent with labs done during recent hospitalization.  Hemoglobin 10.0, stable compared to recent  labs.  Platelet count 674, thrombocytosis improved compared to recent labs.  Lactic acid 3.1.  Sodium mildly elevated at 148, consistent with prior labs.  Blood glucose 120.  BUN 27, creatinine 1.3.  Creatinine was 0.9 at the time of recent hospital discharge.  LFTs normal.  UA not suggestive of infection.  Urine culture pending.  Blood culture x2 pending.  Respiratory panel pending.  Chest x-ray showing moderate to marked severity left basilar atelectasis and/or infiltrate, stable in severity compared to the prior study.  Small left pleural effusion which is mildly increased in size.  Noted to have sacral pressure wound.  Inspection of vulva and vaginal introitus done in the ED did not reveal any injury or wound.   Patient had a very large bowel movement in the ED with brown stool.  Patient was given vancomycin, cefepime, and metronidazole.  Review of Systems:  All systems reviewed and apart from history of presenting illness, are negative.  Past Medical History:  Diagnosis Date  . Adenocarcinoma of breast ([place])   . Allergic rhinitis   . Anemia   . Anxiety   . Depression   . Rheumatoid arthritis(714.0)     Past Surgical History:  Procedure Laterality Date  . ANKLE SURGERY    . CATARACT EXTRACTION    . masectomy     left breast     reports that she has quit smoking. She has never used smokeless tobacco. She reports that she does not drink alcohol or use drugs.  Allergies  Allergen Reactions  . Penicillins     REACTION: Question of penicillin allergy Did it involve swelling of the face/tongue/throat, SOB, or low BP? Unknown Did it involve  sudden or severe rash/hives, skin peeling, or any reaction on the inside of your mouth or nose? Unknown Did you need to seek medical attention at a hospital or doctor's office? Unknown When did it last happen?Unknown If all above answers are "NO", may proceed with cephalosporin use.     Family History  Problem Relation Age of Onset    . Diabetes Mother   . Cancer Father   . Cancer Sister   . Arthritis Son     Prior to Admission medications   Medication Sig Start Date End Date Taking? Authorizing Provider  acetaminophen (TYLENOL) 325 MG tablet Take 2 tablets (650 mg total) by mouth every 6 (six) hours as needed for mild pain, moderate pain, fever or headache (fever >/= 101). 03/14/19  Yes Hongalgi, Lenis Dickinson, MD  citalopram (CELEXA) 10 MG tablet Take 1 tablet (10 mg total) by mouth daily. 01/25/19 01/25/20 Yes [name], MD  fluticasone [facility]) 50 MCG/ACT nasal spray Place 2 sprays into both nostrils daily as needed for allergies. SHAKE LIQUID AND USE 2 SPRAYS IN EACH NOSTRIL DAILY 03/14/19  Yes Hongalgi, Lenis Dickinson, MD  levothyroxine (SYNTHROID) 25 MCG tablet Take 1 tablet (25 mcg total) by mouth daily before breakfast. 08/11/18  Yes [name], MD  metoprolol tartrate (LOPRESSOR) 25 MG tablet Take 1 tablet (25 mg total) by mouth 2 (two) times daily. 03/14/19  Yes Hongalgi, Lenis Dickinson, MD  Vitamin D, Ergocalciferol, (DRISDOL) 1.25 MG (50000 UT) CAPS capsule Take 1 capsule (50,000 Units total) by mouth every 7 (seven) days. Patient not taking: Reported on 03/09/2019 08/11/18   [name], MD    Physical Exam: Vitals:   03/30/19 2300 03/30/19 2330 03/30/19 2331 03/31/19 0000  BP: 106/68 (!) 116/57  124/60  Pulse:      Resp: 17     Temp:      TempSrc:      SpO2:   100%   Weight:      Height:        Physical Exam  Constitutional: She is oriented to person, place, and time. No distress.  Frail elderly female  HENT:  Head: Normocephalic.  Dry mucous membranes  Eyes: Right eye exhibits no discharge. Left eye exhibits no discharge.  Neck: JVD present.  Cardiovascular: Normal rate, regular rhythm and intact distal pulses.  Pulmonary/Chest: Effort normal. She has no wheezes.  Mild rales appreciated at the left lung base Satting well on 2 L supplemental oxygen  Abdominal: Soft. Bowel sounds are normal. There is no  abdominal tenderness. There is no rebound and no guarding.  Mild abdominal distention  Musculoskeletal:        General: Edema present.     Cervical back: Neck supple.     Comments: +4 pitting edema bilateral lower extremities  Neurological: She is alert and oriented to person, place, and time.  Skin: Skin is warm and dry. She is not diaphoretic.  Sacral and right hip pressure wounds.  Wound on the medial aspect of the left knee.  Please see images.             Labs on Admission: I have personally reviewed following labs and imaging studies  CBC: Recent Labs  Lab 03/30/19 1730  WBC 2.9*  NEUTROABS 1.7  HGB 10.0*  HCT 34.7*  MCV 93.5  PLT 123XX123*   Basic Metabolic Panel: Recent Labs  Lab 03/30/19 1730  NA 148*  K 3.7  CL 113*  CO2 24  GLUCOSE  120*  BUN 27*  CREATININE 1.36*  CALCIUM 7.9*   GFR: Estimated Creatinine Clearance: 14.5 mL/min (A) (by C-G formula based on SCr of 1.36 mg/dL (H)). Liver Function Tests: Recent Labs  Lab 03/30/19 1730  AST 32  ALT 18  ALKPHOS 66  BILITOT 0.4  PROT 6.6  ALBUMIN 2.6*   No results for input(s): LIPASE, AMYLASE in the last 168 hours. No results for input(s): AMMONIA in the last 168 hours. Coagulation Profile: Recent Labs  Lab 03/30/19 1730  INR 1.2   Cardiac Enzymes: No results for input(s): CKTOTAL, CKMB, CKMBINDEX, TROPONINI in the last 168 hours. BNP (last 3 results) No results for input(s): PROBNP in the last 8760 hours. HbA1C: No results for input(s): HGBA1C in the last 72 hours. CBG: No results for input(s): GLUCAP in the last 168 hours. Lipid Profile: No results for input(s): CHOL, HDL, LDLCALC, TRIG, CHOLHDL, LDLDIRECT in the last 72 hours. Thyroid Function Tests: Recent Labs    03/30/19 1730  TSH 12.915*   Anemia Panel: No results for input(s): VITAMINB12, FOLATE, FERRITIN, TIBC, IRON, RETICCTPCT in the last 72 hours. Urine analysis:    Component Value Date/Time   COLORURINE YELLOW  03/30/2019 1801   APPEARANCEUR CLEAR 03/30/2019 1801   LABSPEC 1.017 03/30/2019 1801   PHURINE 5.0 03/30/2019 1801   GLUCOSEU NEGATIVE 03/30/2019 1801   GLUCOSEU NEGATIVE 01/19/2017 1346   HGBUR SMALL (A) 03/30/2019 1801   BILIRUBINUR NEGATIVE 03/30/2019 1801   BILIRUBINUR neg 03/09/2013 1447   KETONESUR NEGATIVE 03/30/2019 1801   PROTEINUR 100 (A) 03/30/2019 1801   UROBILINOGEN 0.2 01/19/2017 1346   NITRITE NEGATIVE 03/30/2019 1801   LEUKOCYTESUR NEGATIVE 03/30/2019 1801    Radiological Exams on Admission: CT ABDOMEN PELVIS WO CONTRAST  Result Date: 03/30/2019 CLINICAL DATA:  Sepsis abdominal pain and distension EXAM: CT ABDOMEN AND PELVIS WITHOUT CONTRAST TECHNIQUE: Multidetector CT imaging of the abdomen and pelvis was performed following the standard protocol without IV contrast. COMPARISON:  None. FINDINGS: Lower chest: There is mild cardiomegaly. A small left pleural effusion is seen. Patchy airspace opacity seen at the right lung base. The visualized portions of the lungs are clear. Hepatobiliary: Although limited due to the lack of intravenous contrast, normal in appearance without gross focal abnormality. Layering calcified gallstones are present. Pancreas:  Unremarkable.  No surrounding inflammatory changes. Spleen: Normal in size. Although limited due to the lack of intravenous contrast, normal in appearance. Adrenals/Urinary Tract: Limited visualization of the adrenal glands. The kidneys and collecting system appear normal without evidence of urinary tract calculus or hydronephrosis. Tiny calcifications seen adjacent to the right renal hilum which could be renal vascular. Bladder is unremarkable. Stomach/Bowel: Although limited the stomach, small bowel and colon are grossly unremarkable. Scattered colonic diverticula are noted. Vascular/Lymphatic: Scattered aortic atherosclerotic calcifications are seen without aneurysmal dilatation. Reproductive: Calcified uterine fibroid is seen.  Other: There appears to be diffuse anasarca. Musculoskeletal: Diffuse osteopenia is seen. Bilateral hip osteoarthritis is seen with superior joint space loss and marginal osteophyte formation. IMPRESSION: Small left pleural effusion with patchy airspace opacity which could be due to atelectasis and/or early infectious etiology. Limited examination due to technique and patient's body habitus, however no definite acute intra-abdominal or pelvic pathology. Cholelithiasis Diffuse anasarca Aortic Atherosclerosis (ICD10-I70.0). Electronically Signed   By: Prudencio Pair M.D.   On: 03/30/2019 21:33   DG Chest Port 1 View  Result Date: 03/30/2019 CLINICAL DATA:  Abdominal pain. EXAM: PORTABLE CHEST 1 VIEW COMPARISON:  March 14, 2019 FINDINGS: Moderate  to marked severity left basilar atelectasis and/or infiltrate is seen. This is stable in severity when compared to the prior study. A 6 mm calcified nodular opacity is seen overlying the mid right lung. A small left pleural effusion is noted which is mildly increased in size. No pneumothorax is identified. The cardiac silhouette is mildly enlarged and unchanged in size. Radiopaque surgical clips are seen along the lateral aspect of the mid left hemithorax. Degenerative changes seen throughout the thoracic spine with stable moderate to marked severity dextroscoliosis. IMPRESSION: 1. Moderate to marked severity left basilar atelectasis and/or infiltrate. This is stable in severity when compared to the prior study. 2. Small left pleural effusion which is mildly increased in size. Electronically Signed   By: Virgina Norfolk M.D.   On: 03/30/2019 18:40    EKG: Independently reviewed.  Sinus rhythm, RBBB, and LPFB.  Rate improved since prior tracing.  Assessment/Plan Principal Problem:   Pneumonia due to COVID-19 virus Active Problems:   Severe sepsis (HCC)   Acute respiratory failure with hypoxia (HCC)   Acute diastolic CHF (congestive heart failure) (HCC)   AKI  (acute kidney injury) (HCC)   Severe sepsis and acute hypoxic respiratory failure secondary to COVID-19 viral pneumonia: Febrile, tachycardic, and tachypneic on arrival.  Was initially hypotensive but blood pressure has now improved after small fluid bolus.  Oxygen saturation 86% on room air, improved with 2 L supplemental oxygen.  Labs showing persistent leukopenia.  Lactic acidosis resolved after fluid bolus. Chest x-ray showing moderate to marked severity left basilar atelectasis and/or infiltrate, stable in severity compared to the prior study.  Procalcitonin 0.23.  SARS-CoV-2 PCR test positive.  Influenza panel negative. -Remdesivir dosing per pharmacy -IV Decadron 6 mg daily -Continue vancomycin and cefepime at this time -Vitamin C, zinc -Antitussive as needed -Tylenol as needed -Check inflammatory markers including ferritin, fibrinogen, D-dimer, CRP, LDH -Airborne and contact precautions -Continuous pulse ox -Supplemental oxygen as needed to keep oxygen saturation above 90% -Blood culture x2 pending  Addendum: D-dimer significantly elevated at 5.31. GFR 37 on initial labs and already received a small fluid bolus.  Will order CT angiogram with low-dose contrast to rule out PE.  Addendum 4:45 am: Received a call from radiologist stating CTA showing a small PE in the right lower lobe and signs of mild CHF.  Start heparin for anticoagulation.  Blood pressure has now improved and sepsis physiology resolved, will order IV Lasix 20 mg.  Please reassess in a.m. and order additional doses of Lasix.  Acute diastolic CHF: Echo done [id number] showing normal LV function and grade 1 diastolic dysfunction.  Appears volume overloaded with JVD and significant bilateral lower extremity edema.  Chest x-ray showing a small left pleural effusion which is mildly increased in size.  BNP elevated at 800.  She received a small fluid bolus for hypotension in the setting of severe sepsis.  Blood pressure currently  improved but still soft.  Will hold off giving Lasix at this time.  Please reassess in a.m. and give Lasix if blood pressure is able to tolerate.  Abdominal pain: Currently appears comfortable.  CT showing no acute intra-abdominal or pelvic pathology.  No complaints of vomiting or diarrhea.  Patient had very large bowel movement with brown stool in the ED.  Continue to monitor.  Mild hypernatremia: Suspect due to dehydration.  Received a small fluid bolus.  Repeat labs in a.m.  AKI: Suspect prerenal due to severe sepsis/hypotension.  Creatinine 1.3, was 0.9 at the time  of recent hospital discharge.  Received a small fluid bolus.  Avoid additional fluids given signs of volume overload.  Repeat labs in a.m.  Avoid nephrotoxic agents/contrast.  Sacral/ right hip pressure ulcer: Wound care consult  Hypothyroidism: Per pharmacy med rec, son reported that patient has not taken her Synthroid in over a month.  TSH significantly elevated at 12.9. Will resume home dose of Synthroid.  Will need repeat thyroid function studies in 6 weeks.  DVT prophylaxis: Subcutaneous heparin Code Status: DNR.  Discussed with the patient's son. Family Communication: Son at bedside. Disposition Plan: Anticipate discharge after clinical improvement. Admission status: It is my clinical opinion that admission to INPATIENT is reasonable and necessary because of the expectation that this patient will require hospital care that crosses at least 2 midnights to treat this condition based on the medical complexity of the problems presented.  Given the aforementioned information, the predictability of an adverse outcome is felt to be significant.  The medical decision making on this patient was of high complexity and the patient is at high risk for clinical deterioration, therefore this is a level 3 visit.  Shela Leff MD [facility]  If 7PM-7AM, please contact night-coverage [email or web address]  03/31/2019, 12:15 AM

## 2019-03-30 NOTE — ED Notes (Signed)
Pt transported to CT ?

## 2019-03-30 NOTE — ED Notes (Signed)
Sacrum wound noted on buttock and right side of hip. Also wound noted on left knee.    Per family patient has a vaginal wound. No wounds noted in vaginal area. Breakdown spots noted on buttocks.    Pfeiffer, EDP made aware and has evaluated wounds.

## 2019-03-31 ENCOUNTER — Inpatient Hospital Stay (HOSPITAL_COMMUNITY): Payer: Medicare Other

## 2019-03-31 ENCOUNTER — Encounter (HOSPITAL_COMMUNITY): Payer: Self-pay | Admitting: Internal Medicine

## 2019-03-31 DIAGNOSIS — N179 Acute kidney failure, unspecified: Secondary | ICD-10-CM

## 2019-03-31 DIAGNOSIS — I5031 Acute diastolic (congestive) heart failure: Secondary | ICD-10-CM

## 2019-03-31 DIAGNOSIS — J9600 Acute respiratory failure, unspecified whether with hypoxia or hypercapnia: Secondary | ICD-10-CM | POA: Diagnosis present

## 2019-03-31 DIAGNOSIS — U071 COVID-19: Secondary | ICD-10-CM

## 2019-03-31 DIAGNOSIS — J9601 Acute respiratory failure with hypoxia: Secondary | ICD-10-CM

## 2019-03-31 LAB — BASIC METABOLIC PANEL
Anion gap: 7 (ref 5–15)
BUN: 23 mg/dL (ref 8–23)
CO2: 22 mmol/L (ref 22–32)
Calcium: 6.8 mg/dL — ABNORMAL LOW (ref 8.9–10.3)
Chloride: 115 mmol/L — ABNORMAL HIGH (ref 98–111)
Creatinine, Ser: 1.03 mg/dL — ABNORMAL HIGH (ref 0.44–1.00)
GFR calc Af Amer: 52 mL/min — ABNORMAL LOW (ref >60)
GFR calc non Af Amer: 45 mL/min — ABNORMAL LOW (ref >60)
Glucose, Bld: 102 mg/dL — ABNORMAL HIGH (ref 70–99)
Potassium: 3.8 mmol/L (ref 3.5–5.1)
Sodium: 144 mmol/L (ref 135–145)

## 2019-03-31 LAB — FERRITIN: Ferritin: 172 ng/mL (ref 11–307)

## 2019-03-31 LAB — CBC
HCT: 30.1 % — ABNORMAL LOW (ref 36.0–46.0)
Hemoglobin: 8.6 g/dL — ABNORMAL LOW (ref 12.0–15.0)
MCH: 26.8 pg (ref 26.0–34.0)
MCHC: 28.6 g/dL — ABNORMAL LOW (ref 30.0–36.0)
MCV: 93.8 fL (ref 80.0–100.0)
Platelets: 452 10*3/uL — ABNORMAL HIGH (ref 150–400)
RBC: 3.21 MIL/uL — ABNORMAL LOW (ref 3.87–5.11)
RDW: 21.8 % — ABNORMAL HIGH (ref 11.5–15.5)
WBC: 2 10*3/uL — ABNORMAL LOW (ref 4.0–10.5)
nRBC: 0 % (ref 0.0–0.2)

## 2019-03-31 LAB — HEPARIN LEVEL (UNFRACTIONATED): Heparin Unfractionated: 0.16 IU/mL — ABNORMAL LOW (ref 0.30–0.70)

## 2019-03-31 LAB — FIBRINOGEN: Fibrinogen: 490 mg/dL — ABNORMAL HIGH (ref 210–475)

## 2019-03-31 LAB — C-REACTIVE PROTEIN: CRP: 10.5 mg/dL — ABNORMAL HIGH (ref <1.0)

## 2019-03-31 LAB — D-DIMER, QUANTITATIVE: D-Dimer, Quant: 5.31 ug/mL-FEU — ABNORMAL HIGH (ref 0.00–0.50)

## 2019-03-31 LAB — LACTATE DEHYDROGENASE: LDH: 685 U/L — ABNORMAL HIGH (ref 98–192)

## 2019-03-31 MED ORDER — FUROSEMIDE 10 MG/ML IJ SOLN
40.0000 mg | Freq: Every day | INTRAMUSCULAR | Status: DC
  Administered 2019-03-31 – 2019-04-01 (×2): 40 mg via INTRAVENOUS
  Filled 2019-03-31: qty 4

## 2019-03-31 MED ORDER — FUROSEMIDE 10 MG/ML IJ SOLN
20.0000 mg | Freq: Once | INTRAMUSCULAR | Status: AC
  Administered 2019-03-31: 06:00:00 20 mg via INTRAVENOUS
  Filled 2019-03-31: qty 4

## 2019-03-31 MED ORDER — SODIUM CHLORIDE 0.9 % IV SOLN
100.0000 mg | Freq: Every day | INTRAVENOUS | Status: AC
  Administered 2019-03-31 – 2019-04-03 (×4): 100 mg via INTRAVENOUS
  Filled 2019-03-31 (×4): qty 20

## 2019-03-31 MED ORDER — HEPARIN (PORCINE) 25000 UT/250ML-% IV SOLN
600.0000 [IU]/h | INTRAVENOUS | Status: DC
  Administered 2019-03-31: 06:00:00 600 [IU]/h via INTRAVENOUS
  Filled 2019-03-31: qty 250

## 2019-03-31 MED ORDER — SODIUM CHLORIDE (PF) 0.9 % IJ SOLN
INTRAMUSCULAR | Status: AC
  Filled 2019-03-31: qty 50

## 2019-03-31 MED ORDER — IOHEXOL 350 MG/ML SOLN
100.0000 mL | Freq: Once | INTRAVENOUS | Status: AC | PRN
  Administered 2019-03-31: 70 mL via INTRAVENOUS

## 2019-03-31 MED ORDER — HEPARIN (PORCINE) 25000 UT/250ML-% IV SOLN
750.0000 [IU]/h | INTRAVENOUS | Status: DC
  Administered 2019-03-31: 750 [IU]/h via INTRAVENOUS

## 2019-03-31 MED ORDER — HEPARIN SODIUM (PORCINE) 5000 UNIT/ML IJ SOLN: 7500.0000 [IU] | Freq: Three times a day (TID) | INTRAMUSCULAR | Status: DC

## 2019-03-31 MED ORDER — SODIUM CHLORIDE 0.9 % IV SOLN
100.0000 mg | INTRAVENOUS | Status: AC
  Administered 2019-03-31 (×2): 100 mg via INTRAVENOUS
  Filled 2019-03-31 (×2): qty 20

## 2019-03-31 NOTE — Progress Notes (Signed)
[place] for IV heparin  Indication: pulmonary embolus  Allergies  Allergen Reactions  . Penicillins     REACTION: Question of penicillin allergy Did it involve swelling of the face/tongue/throat, SOB, or low BP? Unknown Did it involve sudden or severe rash/hives, skin peeling, or any reaction on the inside of your mouth or nose? Unknown Did you need to seek medical attention at a hospital or doctor's office? Unknown When did it last happen?Unknown If all above answers are "NO", may proceed with cephalosporin use.    Patient Measurements: Height: 5\' 3"  (160 cm) Weight: 89 lb 15.2 oz (40.8 kg) IBW/kg (Calculated) : 52.4  Vital Signs: BP: 160/87 (03/27 1501) Pulse Rate: 75 (03/27 1501)  Labs: Recent Labs    03/30/19 1730 03/31/19 0555 03/31/19 1400  HGB 10.0* 8.6*  --   HCT 34.7* 30.1*  --   PLT 674* 452*  --   APTT 43*  --   --   LABPROT 15.5*  --   --   INR 1.2  --   --   HEPARINUNFRC  --   --  0.16*  CREATININE 1.36* 1.03*  --    Estimated Creatinine Clearance: 19.2 mL/min (A) (by C-G formula based on SCr of 1.03 mg/dL (H)).  Medical History: Past Medical History:  Diagnosis Date  . Adenocarcinoma of breast ([place])   . Allergic rhinitis   . Anemia   . Anxiety   . Depression   . Rheumatoid arthritis(714.0)    Medications:  Scheduled:  . vitamin C  500 mg Oral Daily  . dexamethasone (DECADRON) injection  6 mg Intravenous Q24H  . furosemide  40 mg Intravenous Daily  . levothyroxine  25 mcg Oral Q0600  . sodium chloride (PF)      . sodium chloride (PF)      . vancomycin variable dose per unstable renal function (pharmacist dosing)   Does not apply See admin instructions  . zinc sulfate  220 mg Oral Daily   Infusions:  . ceFEPime (MAXIPIME) IV    . heparin 600 Units/hr (03/31/19 0543)  . remdesivir 100 mg in NS 100 mL 100 mg (03/31/19 1140)   Assessment: 86 yo female with COVID PNA, ddimer > 5, now found to  have a PE per CT scan to start IV heparin per pharmacy dosing. Note that patient received 5000 units of SQ heparin at 2200 3/26. Baseline labs 3/26 PM. Heparin begun 3/27 at 0543 at 600 units/hr, with no loading dose  Goal of Therapy:  Heparin level 0.3-0.7 units/ml Monitor platelets by anticoagulation protocol: Yes   Today, 03/31/2019 1400 Heparin level 0.16 units/ml   Plan:   Increase Heparin to 750 units/hr  Check heparin level at 2400 (~ 8hr after rate increase)  Daily CBC, order daily Hep level at steady state  Minda Ditto PharmD 03/31/2019,3:11 PM

## 2019-03-31 NOTE — ED Notes (Signed)
Patient clean up and bed change done.

## 2019-03-31 NOTE — ED Notes (Signed)
ED TO INPATIENT HANDOFF REPORT  ED Nurse Name and Phone #: [phone]  S Name/Age/Gender Angela [name] 86 y.o. female Room/Bed: WA17/WA17  Code Status   Code Status: DNR  Home/SNF/Other Home Patient oriented to: self, place, time and situation Is this baseline? Yes   Triage Complete: Triage complete  Chief Complaint Severe sepsis ([facility]) [A41.9, R65.20] Acute respiratory failure ([place]) [J96.00]  Triage Note 86 yo from home. Family called 911 for abd pain which pt denies. EMS noted 2 wounds on backside and in vaginal area, +fever of 103.1 en route. BP 88/60, HR 130, Tachypneic 40/min. Sat 98 RA. Pt c/o back pain. Recent admission for sepsis.     Allergies Allergies  Allergen Reactions  . Penicillins     REACTION: Question of penicillin allergy Did it involve swelling of the face/tongue/throat, SOB, or low BP? Unknown Did it involve sudden or severe rash/hives, skin peeling, or any reaction on the inside of your mouth or nose? Unknown Did you need to seek medical attention at a hospital or doctor's office? Unknown When did it last happen?Unknown If all above answers are "NO", may proceed with cephalosporin use.     Level of Care/Admitting Diagnosis ED Disposition    ED Disposition Condition Comment   Admit  Hospital Area: [facility] [[id number]]  Level of Care: Telemetry [5]  Admit to tele based on following criteria: Complex arrhythmia (Bradycardia/Tachycardia)  Covid Evaluation: Confirmed COVID Positive  Diagnosis: Acute respiratory failure ([facility]) [518.81.ICD-9-CM]  Admitting Physician: Shela Leff [id number]  Attending Physician: RAI, RIPUDEEP K [[id number]]  Estimated length of stay: past midnight tomorrow  Certification:: I certify this patient will need inpatient services for at least 2 midnights       B Medical/Surgery History Past Medical History:  Diagnosis Date  . Adenocarcinoma of breast ([place])   . Allergic rhinitis   . Anemia    . Anxiety   . Depression   . Rheumatoid arthritis(714.0)    Past Surgical History:  Procedure Laterality Date  . ANKLE SURGERY    . CATARACT EXTRACTION    . masectomy     left breast     A IV Location/Drains/Wounds Patient Lines/Drains/Airways Status   Active Line/Drains/Airways    Name:   Placement date:   Placement time:   Site:   Days:   Peripheral IV 03/11/19 Right;Upper Arm   03/11/19    0442    Arm   20   Peripheral IV 03/30/19 Right Antecubital   03/30/19    1800    Antecubital   1   Peripheral IV 03/30/19 Left;Upper Arm   03/30/19    1812    Arm   1   External Urinary Catheter   03/08/19    2210    -   23   Pressure Injury 03/10/19 Hip Right;Lateral Stage 2 -  Partial thickness loss of dermis presenting as a shallow open injury with a red, pink wound bed without slough.   03/10/19    0800     21   Pressure Injury 03/10/19 Sacrum Mid Stage 2 -  Partial thickness loss of dermis presenting as a shallow open injury with a red, pink wound bed without slough.   03/10/19    0800     21          Intake/Output Last 24 hours  Intake/Output Summary (Last 24 hours) at 03/31/2019 1556 Last data filed at 03/31/2019 0152 Gross per 24 hour  Intake 1079.21  ml  Output -  Net 1079.21 ml    Labs/Imaging Results for orders placed or performed during the hospital encounter of 03/30/19 (from the past 48 hour(s))  Comprehensive metabolic panel     Status: Abnormal   Collection Time: 03/30/19  5:30 PM  Result Value Ref Range   Sodium 148 (H) 135 - 145 mmol/L   Potassium 3.7 3.5 - 5.1 mmol/L   Chloride 113 (H) 98 - 111 mmol/L   CO2 24 22 - 32 mmol/L   Glucose, Bld 120 (H) 70 - 99 mg/dL    Comment: Glucose reference range applies only to samples taken after fasting for at least 8 hours.   BUN 27 (H) 8 - 23 mg/dL   Creatinine, Ser 1.36 (H) 0.44 - 1.00 mg/dL   Calcium 7.9 (L) 8.9 - 10.3 mg/dL   Total Protein 6.6 6.5 - 8.1 g/dL   Albumin 2.6 (L) 3.5 - 5.0 g/dL   AST 32 15 - 41 U/L    ALT 18 0 - 44 U/L   Alkaline Phosphatase 66 38 - 126 U/L   Total Bilirubin 0.4 0.3 - 1.2 mg/dL   GFR calc non Af Amer 32 (L) >60 mL/min   GFR calc Af Amer 37 (L) >60 mL/min   Anion gap 11 5 - 15    Comment: Performed at [facility], [place] [address] [place], [place] [address]  Lactic acid, plasma     Status: Abnormal   Collection Time: 03/30/19  5:30 PM  Result Value Ref Range   Lactic Acid, Venous 3.1 (HH) 0.5 - 1.9 mmol/L    Comment: CRITICAL RESULT CALLED TO, READ BACK BY AND VERIFIED WITH: Linden Dolin RN [id number] 03/30/19 JM Performed at [facility], [place] [address], [place], [place] [address]   CBC with Differential     Status: Abnormal   Collection Time: 03/30/19  5:30 PM  Result Value Ref Range   WBC 2.9 (L) 4.0 - 10.5 K/uL   RBC 3.71 (L) 3.87 - 5.11 MIL/uL   Hemoglobin 10.0 (L) 12.0 - 15.0 g/dL   HCT 34.7 (L) 36.0 - 46.0 %   MCV 93.5 80.0 - 100.0 fL   MCH 27.0 26.0 - 34.0 pg   MCHC 28.8 (L) 30.0 - 36.0 g/dL   RDW 22.0 (H) 11.5 - 15.5 %   Platelets 674 (H) 150 - 400 K/uL   nRBC 0.0 0.0 - 0.2 %   Neutrophils Relative % 58 %   Neutro Abs 1.7 1.7 - 7.7 K/uL   Lymphocytes Relative 16 %   Lymphs Abs 0.5 (L) 0.7 - 4.0 K/uL   Monocytes Relative 18 %   Monocytes Absolute 0.5 0.1 - 1.0 K/uL   Eosinophils Relative 1 %   Eosinophils Absolute 0.0 0.0 - 0.5 K/uL   Basophils Relative 2 %   Basophils Absolute 0.1 0.0 - 0.1 K/uL   Immature Granulocytes 5 %   Abs Immature Granulocytes 0.13 (H) 0.00 - 0.07 K/uL    Comment: Performed at [facility], [place] [name], [place], [place] [address]  Protime-INR     Status: Abnormal   Collection Time: 03/30/19  5:30 PM  Result Value Ref Range   Prothrombin Time 15.5 (H) 11.4 - 15.2 seconds   INR 1.2 0.8 - 1.2    Comment: (NOTE) INR goal varies based on device and disease states. Performed at [facility], [place] [address], [place], [place] [address]   Culture, blood  (  Routine x 2)     Status: None (Preliminary result)   Collection Time: 03/30/19  5:30 PM   Specimen: BLOOD  Result Value Ref Range   Specimen Description      BLOOD RIGHT ANTECUBITAL Performed at [facility], [place] [address], [place], [place] [address]    Special Requests      BOTTLES DRAWN AEROBIC AND ANAEROBIC Blood Culture results may not be optimal due to an excessive volume of blood received in culture bottles Performed at [place] [address] [place], [place] [address]    Culture      NO GROWTH < 24 HOURS Performed at [place] [address] [place], [place] [address]    Report Status PENDING   APTT     Status: Abnormal   Collection Time: 03/30/19  5:30 PM  Result Value Ref Range   aPTT 43 (H) 24 - 36 seconds    Comment:        IF BASELINE aPTT IS ELEVATED, SUGGEST PATIENT RISK ASSESSMENT BE USED TO DETERMINE APPROPRIATE ANTICOAGULANT THERAPY. Performed at [facility], [place] [address], [place], [place] [address]   Procalcitonin - Baseline     Status: None   Collection Time: 03/30/19  5:30 PM  Result Value Ref Range   Procalcitonin 0.23 ng/mL    Comment:        Interpretation: PCT (Procalcitonin) <= 0.5 ng/mL: Systemic infection (sepsis) is not likely. Local bacterial infection is possible. (NOTE)       Sepsis PCT Algorithm           Lower Respiratory Tract                                      Infection PCT Algorithm    ----------------------------     ----------------------------         PCT < 0.25 ng/mL                PCT < 0.10 ng/mL         Strongly encourage             Strongly discourage   discontinuation of antibiotics    initiation of antibiotics    ----------------------------     -----------------------------       PCT 0.25 - 0.50 ng/mL            PCT 0.10 - 0.25 ng/mL               OR       >80% decrease in PCT            Discourage initiation of                                             antibiotics      Encourage discontinuation           of antibiotics    ----------------------------     -----------------------------         PCT >= 0.50 ng/mL              PCT 0.26 - 0.50 ng/mL               AND        <80% decrease  in PCT             Encourage initiation of                                             antibiotics       Encourage continuation           of antibiotics    ----------------------------     -----------------------------        PCT >= 0.50 ng/mL                  PCT > 0.50 ng/mL               AND         increase in PCT                  Strongly encourage                                      initiation of antibiotics    Strongly encourage escalation           of antibiotics                                     -----------------------------                                           PCT <= 0.25 ng/mL                                                 OR                                        > 80% decrease in PCT                                     Discontinue / Do not initiate                                             antibiotics Performed at [place] [address] [place], [place] [address]   Brain natriuretic peptide     Status: Abnormal   Collection Time: 03/30/19  5:30 PM  Result Value Ref Range   B Natriuretic Peptide 800.7 (H) 0.0 - 100.0 pg/mL    Comment: Performed at [facility], [place] [address] [place], [place] [address]  TSH     Status: Abnormal   Collection Time: 03/30/19  5:30 PM  Result Value Ref Range   TSH 12.915 (H) 0.350 - 4.500 uIU/mL    Comment: Performed by a 3rd Generation assay with a functional sensitivity of <=0.01 uIU/mL.  Performed at [facility], [place] [address], [place], [place] [address]   Culture, blood (Routine x 2)     Status: None (Preliminary result)   Collection Time: 03/30/19  5:40 PM   Specimen: BLOOD  Result Value Ref Range   Specimen  Description      BLOOD LEFT ANTECUBITAL Performed at [facility], [place] [address] [place], [place] [address]    Special Requests      BOTTLES DRAWN AEROBIC AND ANAEROBIC Blood Culture results may not be optimal due to an excessive volume of blood received in culture bottles Performed at [place] [address] [place], [place] [address]    Culture      NO GROWTH < 24 HOURS Performed at [place] [address] [place], [place] [address]    Report Status PENDING   Urinalysis, Routine w reflex microscopic     Status: Abnormal   Collection Time: 03/30/19  6:01 PM  Result Value Ref Range   Color, Urine YELLOW YELLOW   APPearance CLEAR CLEAR   Specific Gravity, Urine 1.017 1.005 - 1.030   pH 5.0 5.0 - 8.0   Glucose, UA NEGATIVE NEGATIVE mg/dL   Hgb urine dipstick SMALL (A) NEGATIVE   Bilirubin Urine NEGATIVE NEGATIVE   Ketones, ur NEGATIVE NEGATIVE mg/dL   Protein, ur 100 (A) NEGATIVE mg/dL   Nitrite NEGATIVE NEGATIVE   Leukocytes,Ua NEGATIVE NEGATIVE   RBC / HPF 0-5 0 - 5 RBC/hpf   WBC, UA 0-5 0 - 5 WBC/hpf   Bacteria, UA RARE (A) NONE SEEN   Squamous Epithelial / LPF 0-5 0 - 5   Mucus PRESENT    Hyaline Casts, UA PRESENT     Comment: Performed at [facility], [place] [address] [place], [place] [address]  Ferritin     Status: None   Collection Time: 03/30/19  6:01 PM  Result Value Ref Range   Ferritin 172 11 - 307 ng/mL    Comment: Performed at [facility], [place] [address] [place], [place] [address]  Fibrinogen     Status: Abnormal   Collection Time: 03/30/19  6:01 PM  Result Value Ref Range   Fibrinogen 490 (H) 210 - 475 mg/dL    Comment: Performed at [facility], [place] [address] [place], [place] [address]  D-dimer, quantitative (not at [facility])     Status: Abnormal   Collection Time: 03/30/19  6:01 PM  Result Value Ref Range   D-Dimer, Quant 5.31 (H) 0.00 - 0.50  ug/mL-FEU    Comment: (NOTE) At the manufacturer cut-off of 0.50 ug/mL FEU, this assay has been documented to exclude PE with a sensitivity and negative predictive value of 97 to 99%.  At this time, this assay has not been approved by the FDA to exclude DVT/VTE. Results should be correlated with clinical presentation. Performed at [facility], [place] [address] [place],  [address]   C-reactive protein     Status: Abnormal   Collection Time: 03/30/19  6:01 PM  Result Value Ref Range   CRP 10.5 (H) <1.0 mg/dL    Comment: Performed at [facility], [place] [address], [place],  [address]  Lactate dehydrogenase     Status: Abnormal   Collection Time: 03/30/19  6:01 PM  Result Value Ref Range   LDH 685 (H) 98 - 192 U/L    Comment: Performed at [facility],  [place] [address] [place], [place] [address]  Lactic acid, plasma     Status: None   Collection Time: 03/30/19  7:31 PM  Result Value Ref Range   Lactic Acid, Venous 1.9 0.5 - 1.9 mmol/L    Comment: Performed at [facility], [place] [address], [place], [place] [address]  Respiratory Panel by RT PCR (Flu A&B, Covid) - Nasopharyngeal Swab     Status: Abnormal   Collection Time: 03/30/19  7:31 PM   Specimen: Nasopharyngeal Swab  Result Value Ref Range   SARS Coronavirus 2 by RT PCR POSITIVE (A) NEGATIVE    Comment: RESULT CALLED TO, READ BACK BY AND VERIFIED WITH: N,MOSCINSKI AT 2306 ON 03/30/19 BY A,MOHAMED (NOTE) SARS-CoV-2 target nucleic acids are DETECTED. SARS-CoV-2 RNA is generally detectable in upper respiratory specimens  during the acute phase of infection. Positive results are indicative of the presence of the identified virus, but do not rule out bacterial infection or co-infection with other pathogens not detected by the test. Clinical correlation with patient history and other diagnostic information is necessary to determine  patient infection status. The expected result is Negative. Fact Sheet for Patients:  PinkCheek.be Fact Sheet for Healthcare Providers: GravelBags.it This test is not yet approved or cleared by the [place] FDA and  has been authorized for detection and/or diagnosis of SARS-CoV-2 by FDA under an Emergency Use Authorization (EUA).  This EUA will remain in effect (meaning this test can be  used) for the duration of  the COVID-19 declaration under Section 564(b)(1) of the Act, 21 U.S.C. section 360bbb-3(b)(1), unless the authorization is terminated or revoked sooner.    Influenza A by PCR NEGATIVE NEGATIVE   Influenza B by PCR NEGATIVE NEGATIVE    Comment: (NOTE) The Xpert Xpress SARS-CoV-2/FLU/RSV assay is intended as an aid in  the diagnosis of influenza from Nasopharyngeal swab specimens and  should not be used as a sole basis for treatment. Nasal washings and  aspirates are unacceptable for Xpert Xpress SARS-CoV-2/FLU/RSV  testing. Fact Sheet for Patients: PinkCheek.be Fact Sheet for Healthcare Providers: GravelBags.it This test is not yet approved or cleared by the [place] FDA and  has been authorized for detection and/or diagnosis of SARS-CoV-2 by  FDA under an Emergency Use Authorization (EUA). This EUA will remain  in effect (meaning this test can be used) for the duration of the  Covid-19 declaration under Section 564(b)(1) of the Act, 21  U.S.C. section 360bbb-3(b)(1), unless the authorization is  terminated or revoked. Performed at [facility], [place] [address] [place], [place] [address]   CBC     Status: Abnormal   Collection Time: 03/31/19  5:55 AM  Result Value Ref Range   WBC 2.0 (L) 4.0 - 10.5 K/uL   RBC 3.21 (L) 3.87 - 5.11 MIL/uL   Hemoglobin 8.6 (L) 12.0 - 15.0 g/dL   HCT 30.1 (L) 36.0 - 46.0 %   MCV 93.8 80.0 - 100.0  fL   MCH 26.8 26.0 - 34.0 pg   MCHC 28.6 (L) 30.0 - 36.0 g/dL   RDW 21.8 (H) 11.5 - 15.5 %   Platelets 452 (H) 150 - 400 K/uL   nRBC 0.0 0.0 - 0.2 %    Comment: Performed at [facility], [place] [address] [place],  [id number]  Basic metabolic panel     Status: Abnormal   Collection Time: 03/31/19  5:55 AM  Result Value Ref Range   Sodium 144 135 -  145 mmol/L   Potassium 3.8 3.5 - 5.1 mmol/L   Chloride 115 (H) 98 - 111 mmol/L   CO2 22 22 - 32 mmol/L   Glucose, Bld 102 (H) 70 - 99 mg/dL    Comment: Glucose reference range applies only to samples taken after fasting for at least 8 hours.   BUN 23 8 - 23 mg/dL   Creatinine, Ser 1.03 (H) 0.44 - 1.00 mg/dL   Calcium 6.8 (L) 8.9 - 10.3 mg/dL   GFR calc non Af Amer 45 (L) >60 mL/min   GFR calc Af Amer 52 (L) >60 mL/min   Anion gap 7 5 - 15    Comment: Performed at [facility], [place] [address], [place], [place] [address]  Heparin level (unfractionated)     Status: Abnormal   Collection Time: 03/31/19  2:00 PM  Result Value Ref Range   Heparin Unfractionated 0.16 (L) 0.30 - 0.70 IU/mL    Comment: (NOTE) If heparin results are below expected values, and patient dosage has  been confirmed, suggest follow up testing of antithrombin III levels. Performed at [facility], [place] [address] [place], [place] [address]    CT ABDOMEN PELVIS WO CONTRAST  Result Date: 03/30/2019 CLINICAL DATA:  Sepsis abdominal pain and distension EXAM: CT ABDOMEN AND PELVIS WITHOUT CONTRAST TECHNIQUE: Multidetector CT imaging of the abdomen and pelvis was performed following the standard protocol without IV contrast. COMPARISON:  None. FINDINGS: Lower chest: There is mild cardiomegaly. A small left pleural effusion is seen. Patchy airspace opacity seen at the right lung base. The visualized portions of the lungs are clear. Hepatobiliary: Although limited due to the lack of intravenous contrast, normal in  appearance without gross focal abnormality. Layering calcified gallstones are present. Pancreas:  Unremarkable.  No surrounding inflammatory changes. Spleen: Normal in size. Although limited due to the lack of intravenous contrast, normal in appearance. Adrenals/Urinary Tract: Limited visualization of the adrenal glands. The kidneys and collecting system appear normal without evidence of urinary tract calculus or hydronephrosis. Tiny calcifications seen adjacent to the right renal hilum which could be renal vascular. Bladder is unremarkable. Stomach/Bowel: Although limited the stomach, small bowel and colon are grossly unremarkable. Scattered colonic diverticula are noted. Vascular/Lymphatic: Scattered aortic atherosclerotic calcifications are seen without aneurysmal dilatation. Reproductive: Calcified uterine fibroid is seen. Other: There appears to be diffuse anasarca. Musculoskeletal: Diffuse osteopenia is seen. Bilateral hip osteoarthritis is seen with superior joint space loss and marginal osteophyte formation. IMPRESSION: Small left pleural effusion with patchy airspace opacity which could be due to atelectasis and/or early infectious etiology. Limited examination due to technique and patient's body habitus, however no definite acute intra-abdominal or pelvic pathology. Cholelithiasis Diffuse anasarca Aortic Atherosclerosis (ICD10-I70.0). Electronically Signed   By: Prudencio Pair M.D.   On: 03/30/2019 21:33   CT ANGIO CHEST PE W OR WO CONTRAST  Result Date: 03/31/2019 CLINICAL DATA:  Elevated D-dimer. EXAM: CT ANGIOGRAPHY CHEST WITH CONTRAST TECHNIQUE: Multidetector CT imaging of the chest was performed using the standard protocol during bolus administration of intravenous contrast. Multiplanar CT image reconstructions and MIPs were obtained to evaluate the vascular anatomy. CONTRAST:  60mL OMNIPAQUE IOHEXOL 350 MG/ML SOLN COMPARISON:  None. FINDINGS: Cardiovascular: Satisfactory opacification of the  pulmonary arteries to the segmental level. Small pulmonary embolus in the right lower lobe segmental pulmonary artery cardiomegaly. Thoracic aortic atherosclerosis. No pericardial effusion. Mediastinum/Nodes: No enlarged mediastinal, hilar, or axillary lymph nodes. Thyroid gland, trachea, and esophagus demonstrate no significant findings.  Lungs/Pleura: Small bilateral pleural effusions. Left basilar atelectasis. Mild interstitial prominence. No focal consolidation. No pneumothorax. Upper Abdomen: No acute abnormality. Musculoskeletal: No acute osseous abnormality. No aggressive osseous lesion. Severe dextroscoliosis of the thoracolumbar spine. No aggressive osseous lesion. Severe osteoarthritis of the right glenohumeral joint. Severe osteoarthritis of the sternoclavicular joints. Other: Diffuse anasarca. Review of the MIP images confirms the above findings. IMPRESSION: 1. Small pulmonary embolus in the right lower lobe segmental pulmonary artery cardiomegaly. RV/LV ratio 1.3 which can be seen with right heart strain, but there is likely underlying right heart failure in this patient with low PE burden. 2. Findings concerning for mild CHF.  Diffuse anasarca. 3. Aortic Atherosclerosis (ICD10-I70.0). Critical Value/emergent results were called by telephone at the time of interpretation on 03/31/2019 at 4:29 am to provider [facility] , who verbally acknowledged these results. Electronically Signed   By: Kathreen Devoid   On: 03/31/2019 04:46   DG Chest Port 1 View  Result Date: 03/30/2019 CLINICAL DATA:  Abdominal pain. EXAM: PORTABLE CHEST 1 VIEW COMPARISON:  March 14, 2019 FINDINGS: Moderate to marked severity left basilar atelectasis and/or infiltrate is seen. This is stable in severity when compared to the prior study. A 6 mm calcified nodular opacity is seen overlying the mid right lung. A small left pleural effusion is noted which is mildly increased in size. No pneumothorax is identified. The cardiac  silhouette is mildly enlarged and unchanged in size. Radiopaque surgical clips are seen along the lateral aspect of the mid left hemithorax. Degenerative changes seen throughout the thoracic spine with stable moderate to marked severity dextroscoliosis. IMPRESSION: 1. Moderate to marked severity left basilar atelectasis and/or infiltrate. This is stable in severity when compared to the prior study. 2. Small left pleural effusion which is mildly increased in size. Electronically Signed   By: Virgina Norfolk M.D.   On: 03/30/2019 18:40    Pending Labs Unresulted Labs (From admission, onward)    Start     Ordered   03/31/19 0500  Creatinine, serum  Daily,   R     03/30/19 2117   03/30/19 1750  Urine culture  ONCE - STAT,   STAT     03/30/19 1751          Vitals/Pain Today's Vitals   03/31/19 1245 03/31/19 1255 03/31/19 1415 03/31/19 1501  BP:  (!) 151/70 (!) 185/78 (!) 160/87  Pulse: (!) 46 61 (!) 46 75  Resp: 20 16 18  (!) 21  Temp:      TempSrc:      SpO2: 97% 98% 92% 95%  Weight:      Height:      PainSc:        Isolation Precautions Airborne and Contact precautions  Medications Medications  levothyroxine (SYNTHROID) tablet 25 mcg (25 mcg Oral Given 03/31/19 0624)  fluticasone (FLONASE) 50 MCG/ACT nasal spray 2 spray (has no administration in time range)  acetaminophen (TYLENOL) tablet 650 mg (has no administration in time range)    Or  acetaminophen (TYLENOL) suppository 650 mg (has no administration in time range)  vancomycin variable dose per unstable renal function (pharmacist dosing) (has no administration in time range)  ceFEPIme (MAXIPIME) 2 g in sodium chloride 0.9 % 100 mL IVPB (has no administration in time range)  dexamethasone (DECADRON) injection 6 mg (6 mg Intravenous Given 03/31/19 0030)  ascorbic acid (VITAMIN C) tablet 500 mg (500 mg Oral Given 03/31/19 1147)  zinc sulfate capsule 220 mg (220 mg Oral Given  03/31/19 1151)  benzonatate (TESSALON) capsule 100 mg  (has no administration in time range)  remdesivir 100 mg in sodium chloride 0.9 % 100 mL IVPB (0 mg Intravenous Stopped 03/31/19 0152)    Followed by  remdesivir 100 mg in sodium chloride 0.9 % 100 mL IVPB (100 mg Intravenous New Bag/Given 03/31/19 1140)  sodium chloride (PF) 0.9 % injection (has no administration in time range)  sodium chloride (PF) 0.9 % injection (has no administration in time range)  furosemide (LASIX) injection 40 mg (40 mg Intravenous Given 03/31/19 1326)  heparin ADULT infusion 100 units/mL (25000 units/230mL sodium chloride 0.45%) (750 Units/hr Intravenous New Bag/Given 03/31/19 1544)  lactated ringers bolus 500 mL (0 mLs Intravenous Stopped 03/30/19 1857)  ceFEPIme (MAXIPIME) 2 g in sodium chloride 0.9 % 100 mL IVPB (0 g Intravenous Stopped 03/30/19 1845)  metroNIDAZOLE (FLAGYL) IVPB 500 mg (0 mg Intravenous Stopped 03/30/19 1945)  vancomycin (VANCOCIN) IVPB 1000 mg/200 mL premix (0 mg Intravenous Stopped 03/30/19 2044)  acetaminophen (TYLENOL) suppository 650 mg (650 mg Rectal Given 03/30/19 1812)  iohexol (OMNIPAQUE) 350 MG/ML injection 100 mL (70 mLs Intravenous Contrast Given 03/31/19 0411)  furosemide (LASIX) injection 20 mg (20 mg Intravenous Given 03/31/19 0545)    Mobility non-ambulatory High fall risk   Focused Assessments . R Recommendations: See Admitting Provider Note  Report given to: [place]  Additional Notes: n/a

## 2019-03-31 NOTE — Progress Notes (Signed)
[facility]                                                                              Patient Demographics  Angela [name], is a 86 y.o. female, DOB - December 27, 1918, [id number]  Admit date - 03/30/2019   Admitting Physician Shela Leff, MD  Outpatient Primary MD for the patient is [name], MD  Outpatient specialists:   LOS - 1  days   Medical records reviewed and are as summarized below:    Chief Complaint  Patient presents with  . Code Sepsis  . Wound Check       Brief summary   Patient is a 86 year old female with history of breast CA status post mastectomy, anemia, anxiety, rheumatoid arthritis, hypothyroidism, recent hospital admission 3/4-3/10/2019 (secondary to multifocal pneumonia, hyponatremia, AKI, acute diastolic CHF, AVNRT) presented to ED with abdominal pain, fevers, 103.1 F, hypotensive BP 88/60, tachycardia 130, tachypnea with respiratory rate of 40.  O2 sats 98% on room air Patient was also found to have wound in the buttocks, sacral region Chest x-ray showed moderate to marked severity left basilar atelectasis and/or infiltrates.  Patient was placed on vancomycin, cefepime, metronidazole. COVID-19 positive  Assessment & Plan    Principal Problem:  Acute hypoxic respiratory failure due to acute COVID-19 viral pneumonia during the ongoing COVID-19 pandemic- POA, acute PE, sepsis secondary to HCAP - Patient presented with SIRS, hypotension, tachycardia, tachypnea, COVID-19 positive, chest x-ray showed moderate to marked severity left basilar atelectasis/infiltrates  - Currently hypoxic, requiring 2 L nasal cannula - Continue current management: Decadron 6 mg IV daily, Remdesivir per pharmacy protocol -Continue IV vancomycin and cefepime -CRP trending up 10.5, will give Actemra if worsening hypoxia. -CT angiogram chest showed small pulmonary embolus in the right lower lobe in the segmental pulmonary artery, right heart strain -  Continue Supportive care: vitamin C/zinc, albuterol, Tylenol. - Continue to wean oxygen, ambulatory O2 screening daily as tolerated  - Oxygen - SpO2: 98 % O2 Flow Rate (L/min): 2 L/min - Continue to follow labs as below  Lab Results  Component Value Date   SARSCOV2NAA POSITIVE (A) 03/30/2019   [place] NEGATIVE 03/14/2019   [place] NEGATIVE 03/08/2019     Recent Labs  Lab 03/30/19 1730 03/30/19 1801  DDIMER  --  5.31*  FERRITIN  --  172  CRP  --  10.5*  ALT 18  --   PROCALCITON 0.23  --        Active Problems:  Acute pulmonary embolism -CT angiogram of the chest showed acute right lower lobe PE, with possible right heart strain -Obtain venous Dopplers, 2D echocardiogram -Placed on IV heparin drip    Acute diastolic CHF (congestive heart failure) ([place]) -2D echo 03/09/2019 showed normal LV function grade 1 diastolic function however appears to be volume overloaded with significant bilateral lower extremity edema, JVD. -BNP elevated, 800.  CT angiogram of the chest shows PE with possible right heart strain -will place on IV Lasix 40 mg daily, strict I's and O's and daily weights -Repeat echocardiogram, given possibility of right heart strain on CTA    AKI (acute kidney injury) (  [place]) -Possibly due to acute CHF, hypotension and decreased renal perfusion, COVID-19 pneumonia -Currently creatinine improved, placing on diuresis, follow creatinine closely  Hypernatremia Sodium 148 at the time of admission, improved to 144  Hypothyroidism TSH 12.9, resume Synthroid, will need thyroid studies in 4 to 6 weeks  Pressure Injury Documentation: Right hip lateral stage II Mid sacrum stage II, medial aspect of the left knee -Wound care per nursing, wound care consulted, appreciate recommendations  Moderate to severe protein calorie malnutrition Estimated body mass index is 15.93 kg/m as calculated from the following:   Height as of this encounter: 5\' 3"  (1.6 m).   Weight  as of this encounter: 40.8 kg.   Goals of care Advanced age with COVID-19 pneumonia, acute respiratory failure with multiple other comorbidities, requested palliative medicine consult  Code Status: DNR DVT Prophylaxis: Heparin drip Family Communication: Discussed all imaging results, lab results, explained to the patient. Called patient's daughter, asked me to call back   Disposition Plan: Patient from home.  Barriers to discharge, currently pneumonia with acute CHF, COVID-19 respiratory failure   Time Spent in minutes   35 minutes  Procedures:  CT angiogram chest  Consultants:   Palliative medicine  Antimicrobials:   Anti-infectives (From admission, onward)   Start     Dose/Rate Route Frequency Ordered Stop   03/31/19 1800  ceFEPIme (MAXIPIME) 2 g in sodium chloride 0.9 % 100 mL IVPB     2 g 200 mL/hr over 30 Minutes Intravenous Every 24 hours 03/30/19 2117     03/31/19 1000  remdesivir 100 mg in sodium chloride 0.9 % 100 mL IVPB     100 mg 200 mL/hr over 30 Minutes Intravenous Daily 03/31/19 0002 04/04/19 0959   03/31/19 0100  remdesivir 100 mg in sodium chloride 0.9 % 100 mL IVPB     100 mg 200 mL/hr over 30 Minutes Intravenous Every 1 hr x 2 03/31/19 0002 03/31/19 0152   03/30/19 2113  vancomycin variable dose per unstable renal function (pharmacist dosing)      Does not apply See admin instructions 03/30/19 2117     03/30/19 1800  ceFEPIme (MAXIPIME) 2 g in sodium chloride 0.9 % 100 mL IVPB     2 g 200 mL/hr over 30 Minutes Intravenous  Once 03/30/19 1751 03/30/19 1845   03/30/19 1800  metroNIDAZOLE (FLAGYL) IVPB 500 mg     500 mg 100 mL/hr over 60 Minutes Intravenous  Once 03/30/19 1751 03/30/19 1945   03/30/19 1800  vancomycin (VANCOCIN) IVPB 1000 mg/200 mL premix     1,000 mg 200 mL/hr over 60 Minutes Intravenous  Once 03/30/19 1751 03/30/19 2044         Medications  Scheduled Meds: . vitamin C  500 mg Oral Daily  . dexamethasone (DECADRON) injection  6  mg Intravenous Q24H  . levothyroxine  25 mcg Oral Q0600  . sodium chloride (PF)      . sodium chloride (PF)      . vancomycin variable dose per unstable renal function (pharmacist dosing)   Does not apply See admin instructions  . zinc sulfate  220 mg Oral Daily   Continuous Infusions: . ceFEPime (MAXIPIME) IV    . heparin 600 Units/hr (03/31/19 0543)  . remdesivir 100 mg in NS 100 mL 100 mg (03/31/19 1140)   PRN Meds:.acetaminophen **OR** acetaminophen, benzonatate, fluticasone      Subjective:   Angeliah Mcdonell was seen and examined today.  Difficult to obtain review of system  from the patient, frail elderly female,, + JVD with shortness of breath.  No fevers.  Patient denies dizziness, chest pain, abdominal pain, N/V/D/C, new weakness, numbess, tingling.   Objective:   Vitals:   03/31/19 1115 03/31/19 1130 03/31/19 1215 03/31/19 1255  BP:  (!) 141/104 (!) 141/77 (!) 151/70  Pulse: 61 (!) 41 (!) 53 61  Resp:   20 16  Temp:      TempSrc:      SpO2: 96% 96% 97% 98%  Weight:      Height:        Intake/Output Summary (Last 24 hours) at 03/31/2019 1259 Last data filed at 03/31/2019 0152 Gross per 24 hour  Intake 1079.21 ml  Output --  Net 1079.21 ml     Wt Readings from Last 3 Encounters:  03/30/19 40.8 kg  03/08/19 40.8 kg     Exam  General: Alert and awake, somewhat short of breath  Cardiovascular: S1 S2 auscultated, no murmurs, RRR  Respiratory: Bibasilar Rales  Gastrointestinal: Soft, nontender, mildly distended, + bowel sounds  Ext: 3+ pedal edema bilaterally  Neuro: AAOx3, Cr N's II- XII. Strength 5/5 upper and lower extremities bilaterally  Musculoskeletal: No digital cyanosis, clubbing  Skin: Sacral and right hip pressure wounds wound on the medial aspect of the left knee  Psych: Normal affect and demeanor, alert and oriented x3   Right hip wound      Medial aspect Left knee     Sacral wound      Data Reviewed:  I have personally  reviewed following labs and imaging studies  Micro Results Recent Results (from the past 240 hour(s))  Respiratory Panel by RT PCR (Flu A&B, Covid) - Nasopharyngeal Swab     Status: Abnormal   Collection Time: 03/30/19  7:31 PM   Specimen: Nasopharyngeal Swab  Result Value Ref Range Status   SARS Coronavirus 2 by RT PCR POSITIVE (A) NEGATIVE Final    Comment: RESULT CALLED TO, READ BACK BY AND VERIFIED WITH: N,MOSCINSKI AT 2306 ON 03/30/19 BY A,MOHAMED (NOTE) SARS-CoV-2 target nucleic acids are DETECTED. SARS-CoV-2 RNA is generally detectable in upper respiratory specimens  during the acute phase of infection. Positive results are indicative of the presence of the identified virus, but do not rule out bacterial infection or co-infection with other pathogens not detected by the test. Clinical correlation with patient history and other diagnostic information is necessary to determine patient infection status. The expected result is Negative. Fact Sheet for Patients:  PinkCheek.be Fact Sheet for Healthcare Providers: GravelBags.it This test is not yet approved or cleared by the [place] FDA and  has been authorized for detection and/or diagnosis of SARS-CoV-2 by FDA under an Emergency Use Authorization (EUA).  This EUA will remain in effect (meaning this test can be  used) for the duration of  the COVID-19 declaration under Section 564(b)(1) of the Act, 21 U.S.C. section 360bbb-3(b)(1), unless the authorization is terminated or revoked sooner.    Influenza A by PCR NEGATIVE NEGATIVE Final   Influenza B by PCR NEGATIVE NEGATIVE Final    Comment: (NOTE) The Xpert Xpress SARS-CoV-2/FLU/RSV assay is intended as an aid in  the diagnosis of influenza from Nasopharyngeal swab specimens and  should not be used as a sole basis for treatment. Nasal washings and  aspirates are unacceptable for Xpert Xpress SARS-CoV-2/FLU/RSV    testing. Fact Sheet for Patients: PinkCheek.be Fact Sheet for Healthcare Providers: GravelBags.it This test is not yet approved or cleared by the  [place] Architectural technologist and  has been authorized for detection and/or diagnosis of SARS-CoV-2 by  FDA under an Print production planner (EUA). This EUA will remain  in effect (meaning this test can be used) for the duration of the  Covid-19 declaration under Section 564(b)(1) of the Act, 21  U.S.C. section 360bbb-3(b)(1), unless the authorization is  terminated or revoked. Performed at [facility], [place] [address], [place], [place] [address]     Radiology Reports CT ABDOMEN PELVIS WO CONTRAST  Result Date: 03/30/2019 CLINICAL DATA:  Sepsis abdominal pain and distension EXAM: CT ABDOMEN AND PELVIS WITHOUT CONTRAST TECHNIQUE: Multidetector CT imaging of the abdomen and pelvis was performed following the standard protocol without IV contrast. COMPARISON:  None. FINDINGS: Lower chest: There is mild cardiomegaly. A small left pleural effusion is seen. Patchy airspace opacity seen at the right lung base. The visualized portions of the lungs are clear. Hepatobiliary: Although limited due to the lack of intravenous contrast, normal in appearance without gross focal abnormality. Layering calcified gallstones are present. Pancreas:  Unremarkable.  No surrounding inflammatory changes. Spleen: Normal in size. Although limited due to the lack of intravenous contrast, normal in appearance. Adrenals/Urinary Tract: Limited visualization of the adrenal glands. The kidneys and collecting system appear normal without evidence of urinary tract calculus or hydronephrosis. Tiny calcifications seen adjacent to the right renal hilum which could be renal vascular. Bladder is unremarkable. Stomach/Bowel: Although limited the stomach, small bowel and colon are grossly unremarkable. Scattered colonic  diverticula are noted. Vascular/Lymphatic: Scattered aortic atherosclerotic calcifications are seen without aneurysmal dilatation. Reproductive: Calcified uterine fibroid is seen. Other: There appears to be diffuse anasarca. Musculoskeletal: Diffuse osteopenia is seen. Bilateral hip osteoarthritis is seen with superior joint space loss and marginal osteophyte formation. IMPRESSION: Small left pleural effusion with patchy airspace opacity which could be due to atelectasis and/or early infectious etiology. Limited examination due to technique and patient's body habitus, however no definite acute intra-abdominal or pelvic pathology. Cholelithiasis Diffuse anasarca Aortic Atherosclerosis (ICD10-I70.0). Electronically Signed   By: Prudencio Pair M.D.   On: 03/30/2019 21:33   DG Shoulder Right  Result Date: 03/08/2019 CLINICAL DATA:  Right shoulder pain, no known injury, initial encounter EXAM: RIGHT SHOULDER - 2+ VIEW COMPARISON:  None. FINDINGS: Degenerative changes of the glenohumeral joint are seen. Some deformity of the proximal humerus is seen likely related to prior fracture with healing. Degenerative changes of the acromioclavicular joint are noted as well. The underlying bony thorax shows no acute abnormality. IMPRESSION: Chronic changes in the shoulder without acute abnormality. Electronically Signed   By: Inez Catalina M.D.   On: 03/08/2019 21:30   CT HEAD WO CONTRAST  Result Date: 03/08/2019 CLINICAL DATA:  Recent falls with headaches and neck pain, initial encounter EXAM: CT HEAD WITHOUT CONTRAST CT CERVICAL SPINE WITHOUT CONTRAST TECHNIQUE: Multidetector CT imaging of the head and cervical spine was performed following the standard protocol without intravenous contrast. Multiplanar CT image reconstructions of the cervical spine were also generated. COMPARISON:  07/19/2009 FINDINGS: CT HEAD FINDINGS Brain: Heavy calcification of the basal ganglia is seen bilaterally and stable. Increasing chronic white  matter ischemic change is noted. Mild atrophic changes are seen as well. Vascular: No hyperdense vessel or unexpected calcification. Skull: Normal. Negative for fracture or focal lesion. Sinuses/Orbits: No acute finding. Other: None. CT CERVICAL SPINE FINDINGS Alignment: Within normal limits. Skull base and vertebrae: Exam is somewhat limited by patient motion artifact. Seven cervical segments are well visualized. Vertebral body  height is well maintained. Disc space narrowing is noted from C2-C6 with mild osteophytic changes. No acute fracture or acute facet abnormality is noted. The odontoid is within normal limits. Soft tissues and spinal canal: Surrounding soft tissue structures show no acute abnormality. Upper chest: Visualized lung apices are unremarkable. Other: None IMPRESSION: CT of the head: Chronic atrophic and ischemic changes without acute abnormality. CT of the cervical spine: Somewhat limited exam due to patient motion artifact. Multilevel degenerative change is seen without acute abnormality. Electronically Signed   By: Inez Catalina M.D.   On: 03/08/2019 21:39   CT ANGIO CHEST PE W OR WO CONTRAST  Result Date: 03/31/2019 CLINICAL DATA:  Elevated D-dimer. EXAM: CT ANGIOGRAPHY CHEST WITH CONTRAST TECHNIQUE: Multidetector CT imaging of the chest was performed using the standard protocol during bolus administration of intravenous contrast. Multiplanar CT image reconstructions and MIPs were obtained to evaluate the vascular anatomy. CONTRAST:  24mL OMNIPAQUE IOHEXOL 350 MG/ML SOLN COMPARISON:  None. FINDINGS: Cardiovascular: Satisfactory opacification of the pulmonary arteries to the segmental level. Small pulmonary embolus in the right lower lobe segmental pulmonary artery cardiomegaly. Thoracic aortic atherosclerosis. No pericardial effusion. Mediastinum/Nodes: No enlarged mediastinal, hilar, or axillary lymph nodes. Thyroid gland, trachea, and esophagus demonstrate no significant findings.  Lungs/Pleura: Small bilateral pleural effusions. Left basilar atelectasis. Mild interstitial prominence. No focal consolidation. No pneumothorax. Upper Abdomen: No acute abnormality. Musculoskeletal: No acute osseous abnormality. No aggressive osseous lesion. Severe dextroscoliosis of the thoracolumbar spine. No aggressive osseous lesion. Severe osteoarthritis of the right glenohumeral joint. Severe osteoarthritis of the sternoclavicular joints. Other: Diffuse anasarca. Review of the MIP images confirms the above findings. IMPRESSION: 1. Small pulmonary embolus in the right lower lobe segmental pulmonary artery cardiomegaly. RV/LV ratio 1.3 which can be seen with right heart strain, but there is likely underlying right heart failure in this patient with low PE burden. 2. Findings concerning for mild CHF.  Diffuse anasarca. 3. Aortic Atherosclerosis (ICD10-I70.0). Critical Value/emergent results were called by telephone at the time of interpretation on 03/31/2019 at 4:29 am to provider [facility] , who verbally acknowledged these results. Electronically Signed   By: Kathreen Devoid   On: 03/31/2019 04:46   CT CERVICAL SPINE WO CONTRAST  Result Date: 03/08/2019 CLINICAL DATA:  Recent falls with headaches and neck pain, initial encounter EXAM: CT HEAD WITHOUT CONTRAST CT CERVICAL SPINE WITHOUT CONTRAST TECHNIQUE: Multidetector CT imaging of the head and cervical spine was performed following the standard protocol without intravenous contrast. Multiplanar CT image reconstructions of the cervical spine were also generated. COMPARISON:  07/19/2009 FINDINGS: CT HEAD FINDINGS Brain: Heavy calcification of the basal ganglia is seen bilaterally and stable. Increasing chronic white matter ischemic change is noted. Mild atrophic changes are seen as well. Vascular: No hyperdense vessel or unexpected calcification. Skull: Normal. Negative for fracture or focal lesion. Sinuses/Orbits: No acute finding. Other: None. CT  CERVICAL SPINE FINDINGS Alignment: Within normal limits. Skull base and vertebrae: Exam is somewhat limited by patient motion artifact. Seven cervical segments are well visualized. Vertebral body height is well maintained. Disc space narrowing is noted from C2-C6 with mild osteophytic changes. No acute fracture or acute facet abnormality is noted. The odontoid is within normal limits. Soft tissues and spinal canal: Surrounding soft tissue structures show no acute abnormality. Upper chest: Visualized lung apices are unremarkable. Other: None IMPRESSION: CT of the head: Chronic atrophic and ischemic changes without acute abnormality. CT of the cervical spine: Somewhat limited exam due to patient motion artifact.  Multilevel degenerative change is seen without acute abnormality. Electronically Signed   By: Inez Catalina M.D.   On: 03/08/2019 21:39   DG Chest Port 1 View  Result Date: 03/30/2019 CLINICAL DATA:  Abdominal pain. EXAM: PORTABLE CHEST 1 VIEW COMPARISON:  March 14, 2019 FINDINGS: Moderate to marked severity left basilar atelectasis and/or infiltrate is seen. This is stable in severity when compared to the prior study. A 6 mm calcified nodular opacity is seen overlying the mid right lung. A small left pleural effusion is noted which is mildly increased in size. No pneumothorax is identified. The cardiac silhouette is mildly enlarged and unchanged in size. Radiopaque surgical clips are seen along the lateral aspect of the mid left hemithorax. Degenerative changes seen throughout the thoracic spine with stable moderate to marked severity dextroscoliosis. IMPRESSION: 1. Moderate to marked severity left basilar atelectasis and/or infiltrate. This is stable in severity when compared to the prior study. 2. Small left pleural effusion which is mildly increased in size. Electronically Signed   By: Virgina Norfolk M.D.   On: 03/30/2019 18:40   DG CHEST PORT 1 VIEW  Result Date: 03/14/2019 CLINICAL DATA:   Respiratory failure, pneumonia EXAM: PORTABLE CHEST 1 VIEW COMPARISON:  03/11/2019 FINDINGS: Cardiomegaly. Vascular congestion and bilateral interstitial and airspace opacities, worsening since prior study. Suspect small effusions. No acute bony abnormality. IMPRESSION: Worsening bilateral interstitial and airspace opacities, edema versus infection. Small bilateral effusions. Electronically Signed   By: Rolm Baptise M.D.   On: 03/14/2019 08:24   DG CHEST PORT 1 VIEW  Result Date: 03/11/2019 CLINICAL DATA:  86 year old female with history of shortness of breath. EXAM: PORTABLE CHEST 1 VIEW COMPARISON:  Chest x-ray 03/08/2019. FINDINGS: There is cephalization of the pulmonary vasculature and slight indistinctness of the interstitial markings suggestive of mild pulmonary edema. Trace right pleural effusion. Mild cardiomegaly. The patient is rotated to the left on today's exam, resulting in distortion of the mediastinal contours and reduced diagnostic sensitivity and specificity for mediastinal pathology. Aortic atherosclerosis. Surgical clips projecting over the left axillary region, likely from prior lymph node dissection. IMPRESSION: 1. The appearance of the chest is most compatible with mild congestive heart failure, as above. 2. Aortic atherosclerosis. Electronically Signed   By: Vinnie Langton M.D.   On: 03/11/2019 04:50   DG Chest Port 1 View  Result Date: 03/08/2019 CLINICAL DATA:  Fall, fever EXAM: PORTABLE CHEST 1 VIEW COMPARISON:  Radiograph August 30, 2013 FINDINGS: Diffuse hazy interstitial opacities throughout both lungs with more patchy opacity in the right lung base and a small right pleural effusion. No pneumothorax. Cardiac silhouette is borderline enlarged. The aorta is calcified and tortuous. The osseous structures appear diffusely demineralized which may limit detection of small or nondisplaced fractures. Degenerative changes are present in the imaged spine and shoulders. Stable  dextrocurvature of the thoracic spine. Postsurgical changes in the left axilla. IMPRESSION: Diffuse hazy interstitial opacities throughout both lungs with more patchy opacity in the right lung base and a small right pleural effusion. Findings may represent pulmonary edema and/or multifocal pneumonia. Electronically Signed   By: Lovena Le M.D.   On: 03/08/2019 18:19   ECHOCARDIOGRAM COMPLETE  Result Date: 03/09/2019    ECHOCARDIOGRAM REPORT   Patient Name:   RICKIYAH GREENWALD Date of Exam: 03/09/2019 Medical Rec #:  [id number]        Height:       63.0 in Accession #:    [id number]       Weight:  89.9 lb Date of Birth:  Feb 26, 1918        BSA:          1.377 m Patient Age:    18 years         BP:           102/62 mmHg Patient Gender: F                HR:           85 bpm. Exam Location:  Inpatient Procedure: 2D Echo, Color Doppler and Cardiac Doppler Indications:    R06.9 DOE  History:        Patient has no prior history of Echocardiogram examinations.                 Risk Factors:Dyslipidemia. Mastectomy.  Sonographer:    Raquel Sarna Senior RDCS Referring Phys: [phone] [place]  1. Left ventricular ejection fraction, by estimation, is 60 to 65%. The left ventricle has normal function. The left ventricle has no regional wall motion abnormalities. Left ventricular diastolic parameters are consistent with Grade I diastolic dysfunction (impaired relaxation).  2. Right ventricular systolic function is normal. The right ventricular size is normal. There is moderately elevated pulmonary artery systolic pressure.  3. Left atrial size was moderately dilated.  4. Right atrial size was moderately dilated.  5. The mitral valve is normal in structure and function. Trivial mitral valve regurgitation.  6. Tricuspid valve regurgitation is moderate.  7. The aortic valve is tricuspid. Aortic valve regurgitation is trivial. Moderate aortic valve stenosis. FINDINGS  Left Ventricle: Left ventricular ejection fraction,  by estimation, is 60 to 65%. The left ventricle has normal function. The left ventricle has no regional wall motion abnormalities. The left ventricular internal cavity size was normal in size. There is  no left ventricular hypertrophy. Left ventricular diastolic parameters are consistent with Grade I diastolic dysfunction (impaired relaxation). Right Ventricle: The right ventricular size is normal. No increase in right ventricular wall thickness. Right ventricular systolic function is normal. There is moderately elevated pulmonary artery systolic pressure. The tricuspid regurgitant velocity is 3.26 m/s, and with an assumed right atrial pressure of 3 mmHg, the estimated right ventricular systolic pressure is [id number] mmHg. Left Atrium: Left atrial size was moderately dilated. Right Atrium: Right atrial size was moderately dilated. Pericardium: There is no evidence of pericardial effusion. Mitral Valve: The mitral valve is normal in structure and function. There is mild calcification of the mitral valve leaflet(s). Trivial mitral valve regurgitation. Tricuspid Valve: The tricuspid valve is normal in structure. Tricuspid valve regurgitation is moderate. Aortic Valve: The aortic valve is tricuspid. Aortic valve regurgitation is trivial. Moderate aortic stenosis is present. There is moderate calcification of the aortic valve. Aortic valve mean gradient measures 15.0 mmHg. Aortic valve peak gradient measures 25.8 mmHg. Aortic valve area, by VTI measures 0.72 cm. Pulmonic Valve: The pulmonic valve was normal in structure. Pulmonic valve regurgitation is not visualized. Aorta: The aortic root and ascending aorta are structurally normal, with no evidence of dilitation. IAS/Shunts: No atrial level shunt detected by color flow Doppler.  LEFT VENTRICLE PLAX 2D LVIDd:         3.50 cm  Diastology LVIDs:         2.60 cm  LV e' lateral:   3.09 cm/s LV PW:         1.20 cm  LV E/e' lateral: 20.0 LV IVS:        1.00 cm  LV  e' medial:     2.47 cm/s LVOT diam:     1.80 cm  LV E/e' medial:  25.0 LV SV:         35 LV SV Index:   25 LVOT Area:     2.54 cm  RIGHT VENTRICLE RV S prime:     8.83 cm/s TAPSE (M-mode): 1.4 cm LEFT ATRIUM             Index       RIGHT ATRIUM           Index LA diam:        3.40 cm 2.47 cm/m  RA Area:     18.50 cm LA Vol (A2C):   66.2 ml 48.08 ml/m RA Volume:   58.40 ml  42.42 ml/m LA Vol (A4C):   48.0 ml 34.86 ml/m LA Biplane Vol: 56.8 ml 41.25 ml/m  AORTIC VALVE AV Area (Vmax):    0.76 cm AV Area (Vmean):   0.72 cm AV Area (VTI):     0.72 cm AV Vmax:           254.00 cm/s AV Vmean:          185.000 cm/s AV VTI:            0.487 m AV Peak Grad:      25.8 mmHg AV Mean Grad:      15.0 mmHg LVOT Vmax:         75.70 cm/s LVOT Vmean:        52.700 cm/s LVOT VTI:          0.137 m LVOT/AV VTI ratio: 0.28  AORTA Ao Root diam: 2.30 cm Ao Asc diam:  2.30 cm MITRAL VALVE                TRICUSPID VALVE MV Area (PHT): 2.87 cm     TR Peak grad:   42.5 mmHg MV Decel Time: 264 msec     TR Vmax:        326.00 cm/s MV E velocity: 61.70 cm/s MV A velocity: 111.00 cm/s  SHUNTS MV E/A ratio:  0.56         Systemic VTI:  0.14 m                             Systemic Diam: 1.80 cm Glori Bickers MD Electronically signed by Glori Bickers MD Signature Date/Time: 03/09/2019/6:22:33 PM    Final    VAS [place] LOWER EXTREMITY VENOUS (DVT)  Result Date: 03/11/2019  Lower Venous DVTStudy Indications: Elevated Ddimer.  Risk Factors: None identified. Limitations: Patient positioning, patient immobility, patient pain tolerance. Comparison Study: No prior studies. Performing Technologist: Oliver Hum RVT  Examination Guidelines: A complete evaluation includes B-mode imaging, spectral Doppler, color Doppler, and power Doppler as needed of all accessible portions of each vessel. Bilateral testing is considered an integral part of a complete examination. Limited examinations for reoccurring indications may be performed as noted. The reflux portion of  the exam is performed with the patient in reverse Trendelenburg.  +---------+---------------+---------+-----------+----------+--------------+ RIGHT    CompressibilityPhasicitySpontaneityPropertiesThrombus Aging +---------+---------------+---------+-----------+----------+--------------+ CFV      Full           Yes      Yes                                 +---------+---------------+---------+-----------+----------+--------------+ SFJ  Full                                                        +---------+---------------+---------+-----------+----------+--------------+ FV Prox  Full                                                        +---------+---------------+---------+-----------+----------+--------------+ FV Mid   Full                                                        +---------+---------------+---------+-----------+----------+--------------+ FV DistalFull                                                        +---------+---------------+---------+-----------+----------+--------------+ PFV      Full                                                        +---------+---------------+---------+-----------+----------+--------------+ POP      Full           Yes      Yes                                 +---------+---------------+---------+-----------+----------+--------------+ PTV      Full                                                        +---------+---------------+---------+-----------+----------+--------------+ PERO     Full                                                        +---------+---------------+---------+-----------+----------+--------------+   +---------+---------------+---------+-----------+----------+--------------+ LEFT     CompressibilityPhasicitySpontaneityPropertiesThrombus Aging +---------+---------------+---------+-----------+----------+--------------+ CFV      Full           Yes      Yes                                  +---------+---------------+---------+-----------+----------+--------------+ SFJ      Full                                                        +---------+---------------+---------+-----------+----------+--------------+  FV Prox  Full                                                        +---------+---------------+---------+-----------+----------+--------------+ FV Mid   Full                                                        +---------+---------------+---------+-----------+----------+--------------+ FV DistalFull                                                        +---------+---------------+---------+-----------+----------+--------------+ PFV      Full                                                        +---------+---------------+---------+-----------+----------+--------------+ POP      Full           Yes      Yes                                 +---------+---------------+---------+-----------+----------+--------------+ PTV      Full                                                        +---------+---------------+---------+-----------+----------+--------------+ PERO     Full                                                        +---------+---------------+---------+-----------+----------+--------------+     Summary: RIGHT: - There is no evidence of deep vein thrombosis in the lower extremity.  - No cystic structure found in the popliteal fossa.  LEFT: - There is no evidence of deep vein thrombosis in the lower extremity.  - No cystic structure found in the popliteal fossa.  *See table(s) above for measurements and observations. Electronically signed by Monica Martinez MD on 03/11/2019 at 11:06:42 AM.    Final     Lab Data:  CBC: Recent Labs  Lab 03/30/19 1730 03/31/19 0555  WBC 2.9* 2.0*  NEUTROABS 1.7  --   HGB 10.0* 8.6*  HCT 34.7* 30.1*  MCV 93.5 93.8  PLT 674* [id number]*   Basic Metabolic Panel: Recent Labs  Lab  03/30/19 1730 03/31/19 0555  NA 148* 144  K 3.7 3.8  CL 113* 115*  CO2 24 22  GLUCOSE 120* 102*  BUN 27* 23  CREATININE 1.36* 1.03*  CALCIUM 7.9* 6.8*   GFR: Estimated Creatinine Clearance: 19.2 mL/min (A) (by C-G  formula based on SCr of 1.03 mg/dL (H)). Liver Function Tests: Recent Labs  Lab 03/30/19 1730  AST 32  ALT 18  ALKPHOS 66  BILITOT 0.4  PROT 6.6  ALBUMIN 2.6*   No results for input(s): LIPASE, AMYLASE in the last 168 hours. No results for input(s): AMMONIA in the last 168 hours. Coagulation Profile: Recent Labs  Lab 03/30/19 1730  INR 1.2   Cardiac Enzymes: No results for input(s): CKTOTAL, CKMB, CKMBINDEX, TROPONINI in the last 168 hours. BNP (last 3 results) No results for input(s): PROBNP in the last 8760 hours. HbA1C: No results for input(s): HGBA1C in the last 72 hours. CBG: No results for input(s): GLUCAP in the last 168 hours. Lipid Profile: No results for input(s): CHOL, HDL, LDLCALC, TRIG, CHOLHDL, LDLDIRECT in the last 72 hours. Thyroid Function Tests: Recent Labs    03/30/19 1730  TSH 12.915*   Anemia Panel: Recent Labs    03/30/19 1801  FERRITIN 172   Urine analysis:    Component Value Date/Time   COLORURINE YELLOW 03/30/2019 1801   APPEARANCEUR CLEAR 03/30/2019 1801   LABSPEC 1.017 03/30/2019 1801   PHURINE 5.0 03/30/2019 1801   GLUCOSEU NEGATIVE 03/30/2019 1801   GLUCOSEU NEGATIVE 01/19/2017 1346   HGBUR SMALL (A) 03/30/2019 1801   BILIRUBINUR NEGATIVE 03/30/2019 1801   BILIRUBINUR neg 03/09/2013 1447   KETONESUR NEGATIVE 03/30/2019 1801   PROTEINUR 100 (A) 03/30/2019 1801   UROBILINOGEN 0.2 01/19/2017 1346   NITRITE NEGATIVE 03/30/2019 1801   LEUKOCYTESUR NEGATIVE 03/30/2019 1801     Shan Padgett M.D. [facility] 03/31/2019, 12:59 PM   Call night coverage person covering after 7pm

## 2019-03-31 NOTE — Consult Note (Signed)
WOC Nurse Consult Note: Patient receiving care in [facility] ED WA17. Consult completed remotely after review of record.  At the current time, the FYIS box contains the following statement:  "DETECTED/POSITIVE/PRESUMPTIVE POSITIVE NOVEL CORONAVIRUS (2019-NCOV)". Reason for Consult: sacral/hip pressure wounds Wound type: unstageable PIs to the sacrum, right trochanter, left medial knee; partial thickness wound to left buttock may represent results of fecal and urinary incontinence and other comorbidities. Pressure Injury POA: Yes Measurement: To be provided by the bedside RN in the flowsheet section Wound bed: please see photos of these wounds.  The images in the photos show wounds with heavy yellow slough that are unstageable Drainage (amount, consistency, odor) unknown at current Periwound: intact Dressing procedure/placement/frequency:  Place saline moistened gauze in the sacral wound bed, and over the right trochanter wound. Cover with an ABD pad, tape in place.  Daily application of a hydrocolloid to the left knee and left buttock wounds.  I have added an air mattress for pressure reduction. Monitor the wound area(s) for worsening of condition such as: Signs/symptoms of infection,  Increase in size,  Development of or worsening of odor, Development of pain, or increased pain at the affected locations.  Notify the medical team if any of these develop.  Thank you for the consult. [place] nurse will not follow at this time.  Please re-consult the [place] team if needed.  Val Riles, RN, MSN, CWOCN, CNS-BC, pager [phone]

## 2019-03-31 NOTE — Progress Notes (Signed)
Attempted echo.  Patient going to be cleaned just now.  Will attempt tmrw per conversation w/ nurse.

## 2019-03-31 NOTE — Progress Notes (Signed)
ANTICOAGULATION CONSULT NOTE - Initial Consult  Pharmacy Consult for IV heparin  Indication: pulmonary embolus  Allergies  Allergen Reactions  . Penicillins     REACTION: Question of penicillin allergy Did it involve swelling of the face/tongue/throat, SOB, or low BP? Unknown Did it involve sudden or severe rash/hives, skin peeling, or any reaction on the inside of your mouth or nose? Unknown Did you need to seek medical attention at a hospital or doctor's office? Unknown When did it last happen?Unknown If all above answers are "NO", may proceed with cephalosporin use.     Patient Measurements: Height: 5\' 3"  (160 cm) Weight: 89 lb 15.2 oz (40.8 kg) IBW/kg (Calculated) : 52.4  Vital Signs: Temp: 97.9 F (36.6 C) (03/26 1944) Temp Source: Oral (03/26 1944) BP: 151/64 (03/27 0500) Pulse Rate: 76 (03/27 0300)  Labs: Recent Labs    03/30/19 1730  HGB 10.0*  HCT 34.7*  PLT 674*  APTT 43*  LABPROT 15.5*  INR 1.2  CREATININE 1.36*    Estimated Creatinine Clearance: 14.5 mL/min (A) (by C-G formula based on SCr of 1.36 mg/dL (H)).   Medical History: Past Medical History:  Diagnosis Date  . Adenocarcinoma of breast ([place])   . Allergic rhinitis   . Anemia   . Anxiety   . Depression   . Rheumatoid arthritis(714.0)     Medications:  Scheduled:  . vitamin C  500 mg Oral Daily  . dexamethasone (DECADRON) injection  6 mg Intravenous Q24H  . furosemide  20 mg Intravenous Once  . levothyroxine  25 mcg Oral Q0600  . sodium chloride (PF)      . sodium chloride (PF)      . vancomycin variable dose per unstable renal function (pharmacist dosing)   Does not apply See admin instructions  . zinc sulfate  220 mg Oral Daily   Infusions:  . ceFEPime (MAXIPIME) IV    . remdesivir 100 mg in NS 100 mL      Assessment: 86 yo female with COVID PNA, ddimer > 5, now found to have a PE per CT scan to start IV heparin per pharmacy dosing. Note that patient received 5000 units  of SQ heparin at 2200 3/26. Baseline labs drawn last PM.  Goal of Therapy:  Heparin level 0.3-0.7 units/ml Monitor platelets by anticoagulation protocol: Yes   Plan:   No loading dose of IV heparin  Start IV heparin at rate of 600 units/hr  Check heparin level 8 hours after start of IV heparin  Daily CBC  Kara Mead 03/31/2019,5:09 AM

## 2019-03-31 NOTE — ED Notes (Signed)
Pt son states that she will only eat cornflakes for breakfast and he wanted to make sure that's what we fed her.

## 2019-03-31 NOTE — ED Notes (Signed)
Patient transported to CT 

## 2019-04-01 ENCOUNTER — Inpatient Hospital Stay (HOSPITAL_COMMUNITY): Payer: Medicare Other

## 2019-04-01 DIAGNOSIS — Z515 Encounter for palliative care: Secondary | ICD-10-CM

## 2019-04-01 DIAGNOSIS — I350 Nonrheumatic aortic (valve) stenosis: Secondary | ICD-10-CM

## 2019-04-01 DIAGNOSIS — Z7189 Other specified counseling: Secondary | ICD-10-CM

## 2019-04-01 DIAGNOSIS — I340 Nonrheumatic mitral (valve) insufficiency: Secondary | ICD-10-CM

## 2019-04-01 DIAGNOSIS — R509 Fever, unspecified: Secondary | ICD-10-CM

## 2019-04-01 DIAGNOSIS — J9600 Acute respiratory failure, unspecified whether with hypoxia or hypercapnia: Secondary | ICD-10-CM

## 2019-04-01 LAB — HEPARIN LEVEL (UNFRACTIONATED)
Heparin Unfractionated: 0.52 IU/mL (ref 0.30–0.70)
Heparin Unfractionated: 0.55 IU/mL (ref 0.30–0.70)
Heparin Unfractionated: 0.79 IU/mL — ABNORMAL HIGH (ref 0.30–0.70)

## 2019-04-01 LAB — ECHOCARDIOGRAM LIMITED
Height: 63 in
Weight: 1439.16 oz

## 2019-04-01 LAB — HEPATIC FUNCTION PANEL
ALT: 25 U/L (ref 0–44)
AST: 53 U/L — ABNORMAL HIGH (ref 15–41)
Albumin: 2.2 g/dL — ABNORMAL LOW (ref 3.5–5.0)
Alkaline Phosphatase: 156 U/L — ABNORMAL HIGH (ref 38–126)
Bilirubin, Direct: 0.1 mg/dL (ref 0.0–0.2)
Indirect Bilirubin: 0.4 mg/dL (ref 0.3–0.9)
Total Bilirubin: 0.5 mg/dL (ref 0.3–1.2)
Total Protein: 6.2 g/dL — ABNORMAL LOW (ref 6.5–8.1)

## 2019-04-01 LAB — BASIC METABOLIC PANEL
Anion gap: 12 (ref 5–15)
BUN: 29 mg/dL — ABNORMAL HIGH (ref 8–23)
CO2: 24 mmol/L (ref 22–32)
Calcium: 7.7 mg/dL — ABNORMAL LOW (ref 8.9–10.3)
Chloride: 109 mmol/L (ref 98–111)
Creatinine, Ser: 1.11 mg/dL — ABNORMAL HIGH (ref 0.44–1.00)
GFR calc Af Amer: 48 mL/min — ABNORMAL LOW (ref >60)
GFR calc non Af Amer: 41 mL/min — ABNORMAL LOW (ref >60)
Glucose, Bld: 72 mg/dL (ref 70–99)
Potassium: 3.2 mmol/L — ABNORMAL LOW (ref 3.5–5.1)
Sodium: 145 mmol/L (ref 135–145)

## 2019-04-01 LAB — C-REACTIVE PROTEIN: CRP: 9.3 mg/dL — ABNORMAL HIGH (ref <1.0)

## 2019-04-01 LAB — CBC
HCT: 37.8 % (ref 36.0–46.0)
Hemoglobin: 11.2 g/dL — ABNORMAL LOW (ref 12.0–15.0)
MCH: 26.9 pg (ref 26.0–34.0)
MCHC: 29.6 g/dL — ABNORMAL LOW (ref 30.0–36.0)
MCV: 90.6 fL (ref 80.0–100.0)
Platelets: 678 10*3/uL — ABNORMAL HIGH (ref 150–400)
RBC: 4.17 MIL/uL (ref 3.87–5.11)
RDW: 21.7 % — ABNORMAL HIGH (ref 11.5–15.5)
WBC: 2 10*3/uL — ABNORMAL LOW (ref 4.0–10.5)
nRBC: 0 % (ref 0.0–0.2)

## 2019-04-01 LAB — CREATININE, SERUM
Creatinine, Ser: 1.17 mg/dL — ABNORMAL HIGH (ref 0.44–1.00)
GFR calc Af Amer: 45 mL/min — ABNORMAL LOW (ref >60)
GFR calc non Af Amer: 38 mL/min — ABNORMAL LOW (ref >60)

## 2019-04-01 LAB — URINE CULTURE: Culture: NO GROWTH

## 2019-04-01 LAB — MRSA PCR SCREENING: MRSA by PCR: NEGATIVE

## 2019-04-01 LAB — FERRITIN: Ferritin: 418 ng/mL — ABNORMAL HIGH (ref 11–307)

## 2019-04-01 LAB — D-DIMER, QUANTITATIVE: D-Dimer, Quant: 2.84 ug/mL-FEU — ABNORMAL HIGH (ref 0.00–0.50)

## 2019-04-01 LAB — VANCOMYCIN, RANDOM: Vancomycin Rm: 8

## 2019-04-01 MED ORDER — HEPARIN (PORCINE) 25000 UT/250ML-% IV SOLN
600.0000 [IU]/h | INTRAVENOUS | Status: DC
  Administered 2019-04-01 – 2019-04-03 (×2): 600 [IU]/h via INTRAVENOUS
  Filled 2019-04-01 (×3): qty 250

## 2019-04-01 MED ORDER — HYDRALAZINE HCL 20 MG/ML IJ SOLN
10.0000 mg | Freq: Once | INTRAMUSCULAR | Status: AC
  Administered 2019-04-01: 10 mg via INTRAVENOUS
  Filled 2019-04-01: qty 1

## 2019-04-01 MED ORDER — POTASSIUM CHLORIDE CRYS ER 20 MEQ PO TBCR
40.0000 meq | EXTENDED_RELEASE_TABLET | Freq: Once | ORAL | Status: AC
  Administered 2019-04-01: 40 meq via ORAL
  Filled 2019-04-01: qty 2

## 2019-04-01 MED ORDER — FUROSEMIDE 10 MG/ML IJ SOLN
40.0000 mg | Freq: Two times a day (BID) | INTRAMUSCULAR | Status: DC
  Administered 2019-04-01 – 2019-04-03 (×4): 40 mg via INTRAVENOUS
  Filled 2019-04-01 (×5): qty 4

## 2019-04-01 NOTE — Progress Notes (Signed)
[facility]                                                                              Patient Demographics  Angela [name], is a 86 y.o. female, DOB - 1918-02-14, [id number]  Admit date - 03/30/2019   Admitting Physician Shela Leff, MD  Outpatient Primary MD for the patient is [name], MD  Outpatient specialists:   LOS - 2  days   Medical records reviewed and are as summarized below:    Chief Complaint  Patient presents with  . Code Sepsis  . Wound Check       Brief summary   Patient is a 86 year old female with history of breast CA status post mastectomy, anemia, anxiety, rheumatoid arthritis, hypothyroidism, recent hospital admission 3/4-3/10/2019 (secondary to multifocal pneumonia, hyponatremia, AKI, acute diastolic CHF, AVNRT) presented to ED with abdominal pain, fevers, 103.1 F, hypotensive BP 88/60, tachycardia 130, tachypnea with respiratory rate of 40.  O2 sats 98% on room air Patient was also found to have wound in the buttocks, sacral region Chest x-ray showed moderate to marked severity left basilar atelectasis and/or infiltrates.  Patient was placed on vancomycin, cefepime, metronidazole. COVID-19 positive  Assessment & Plan    Principal Problem:  Acute hypoxic respiratory failure due to acute COVID-19 viral pneumonia during the ongoing COVID-19 pandemic- POA, acute PE, sepsis secondary to HCAP - Patient presented with SIRS, hypotension, tachycardia, tachypnea, COVID-19 positive, chest x-ray showed moderate to marked severity left basilar atelectasis/infiltrates  - Currently hypoxic, requiring 2 L nasal cannula - Continue current management: Decadron IV, remdesivir day #2 today   -Continue IV vancomycin and cefepime. Will check MRSA PCR screening, DC vancomycin if negative -CRP trending up 10.5, will give Actemra if worsening hypoxia. -CT angiogram chest showed small pulmonary embolus in the right lower lobe in the segmental  pulmonary artery, right heart strain.  Continue IV heparin drip - Continue Supportive care: vitamin C/zinc, albuterol, Tylenol. -Leukopenia likely secondary to COVID-19 - Continue to wean oxygen, ambulatory O2 screening daily as tolerated  - Oxygen - SpO2: 97 % O2 Flow Rate (L/min): 2 L/min - Continue to follow labs as below  Lab Results  Component Value Date   SARSCOV2NAA POSITIVE (A) 03/30/2019   [place] NEGATIVE 03/14/2019   [place] NEGATIVE 03/08/2019     Recent Labs  Lab 03/30/19 1730 03/30/19 1801 04/01/19 0826  DDIMER  --  5.31* 2.84*  FERRITIN  --  172  --   CRP  --  10.5*  --   ALT 18  --  25  PROCALCITON 0.23  --   --        Active Problems:  Acute pulmonary embolism -CT angiogram of the chest showed acute right lower lobe PE, with possible right heart strain.  Continue IV heparin drip - Unable to obtain venous Dopplers due to contractures, will not change the management -Follow 2D echo results     Acute diastolic CHF (congestive heart failure) ([place]) -2D echo 03/09/2019 showed normal LV function grade 1 diastolic function however appears to be volume overloaded with significant bilateral lower extremity edema, JVD. -Still volume overloaded,  3+ pitting edema in lower extremities -BNP elevated, 800.  CT angiogram of the chest shows PE with possible right heart strain - still 1.2 L positive, increase Lasix to 40 mg every 12 hours -Repeat echocardiogram, given possibility of right heart strain on CTA, results pending    AKI (acute kidney injury) ([place]) -Possibly due to acute CHF, hypotension and decreased renal perfusion, COVID-19 pneumonia -Creatinine currently stable  Hypernatremia Sodium 148 at the time of admission, improved to 145  Hypothyroidism TSH 12.9, continue Synthroid -Will need thyroid studies in 4 to 6 weeks  Pressure Injury Documentation: Right hip lateral stage II Mid sacrum stage II, medial aspect of the left knee -Wound care per  nursing, wound care consulted, appreciate recommendations  Moderate to severe protein calorie malnutrition Estimated body mass index is 15.93 kg/m as calculated from the following:   Height as of this encounter: 5\' 3"  (1.6 m).   Weight as of this encounter: 40.8 kg.   Goals of care Advanced age with COVID-19 pneumonia, acute respiratory failure with multiple other comorbidities, requested palliative medicine consult  Code Status: DNR DVT Prophylaxis: Heparin drip Family Communication: Discussed all imaging results, lab results, explained to the patient.  Called patient's son, unable to make contact, left detailed voicemail   Disposition Plan: Patient from home.  Barriers to discharge, currently pneumonia with acute CHF, COVID-19 respiratory failure   Time Spent in minutes   35 minutes  Procedures:  CT angiogram chest  Consultants:   Palliative medicine  Antimicrobials:   Anti-infectives (From admission, onward)   Start     Dose/Rate Route Frequency Ordered Stop   03/31/19 1800  ceFEPIme (MAXIPIME) 2 g in sodium chloride 0.9 % 100 mL IVPB     2 g 200 mL/hr over 30 Minutes Intravenous Every 24 hours 03/30/19 2117     03/31/19 1000  remdesivir 100 mg in sodium chloride 0.9 % 100 mL IVPB     100 mg 200 mL/hr over 30 Minutes Intravenous Daily 03/31/19 0002 04/04/19 0959   03/31/19 0100  remdesivir 100 mg in sodium chloride 0.9 % 100 mL IVPB     100 mg 200 mL/hr over 30 Minutes Intravenous Every 1 hr x 2 03/31/19 0002 03/31/19 0152   03/30/19 2113  vancomycin variable dose per unstable renal function (pharmacist dosing)      Does not apply See admin instructions 03/30/19 2117     03/30/19 1800  ceFEPIme (MAXIPIME) 2 g in sodium chloride 0.9 % 100 mL IVPB     2 g 200 mL/hr over 30 Minutes Intravenous  Once 03/30/19 1751 03/30/19 1845   03/30/19 1800  metroNIDAZOLE (FLAGYL) IVPB 500 mg     500 mg 100 mL/hr over 60 Minutes Intravenous  Once 03/30/19 1751 03/30/19 1945    03/30/19 1800  vancomycin (VANCOCIN) IVPB 1000 mg/200 mL premix     1,000 mg 200 mL/hr over 60 Minutes Intravenous  Once 03/30/19 1751 03/30/19 2044         Medications  Scheduled Meds: . vitamin C  500 mg Oral Daily  . dexamethasone (DECADRON) injection  6 mg Intravenous Q24H  . furosemide  40 mg Intravenous Daily  . levothyroxine  25 mcg Oral Q0600  . vancomycin variable dose per unstable renal function (pharmacist dosing)   Does not apply See admin instructions  . zinc sulfate  220 mg Oral Daily   Continuous Infusions: . ceFEPime (MAXIPIME) IV 2 g (03/31/19 1846)  . heparin    .  remdesivir 100 mg in NS 100 mL 100 mg (03/31/19 1140)   PRN Meds:.acetaminophen **OR** acetaminophen, benzonatate, fluticasone      Subjective:   Angela [name] was seen and examined today.  Difficult to obtain review of system from the patient, does not appear to be in any pain.  No fevers or chills. 3+pitting edema. Patient denies dizziness, chest pain, abdominal pain, N/V/D/C, new weakness, numbess, tingling.   Objective:   Vitals:   03/31/19 1501 03/31/19 1600 03/31/19 1619 03/31/19 1744  BP: (!) 160/87 (!) 157/76 (!) 157/76 (!) 174/74  Pulse: 75  61 70  Resp: (!) 21 20 (!) 25   Temp:    (!) 97.3 F (36.3 C)  TempSrc:    Axillary  SpO2: 95%  99% 97%  Weight:      Height:        Intake/Output Summary (Last 24 hours) at 04/01/2019 1139 Last data filed at 04/01/2019 1031 Gross per 24 hour  Intake 190.14 ml  Output --  Net 190.14 ml     Wt Readings from Last 3 Encounters:  03/30/19 40.8 kg  03/08/19 40.8 kg    Physical Exam  General: Alert and awake, NAD  Cardiovascular: S1 S2 clear, RRR.  3+ pitting edema  Respiratory: Diminished breath sound at the bases  Gastrointestinal: Soft, nontender, nondistended, NBS  Ext: no pedal edema bilaterally  Neuro: No new FND's, contracted in the lower extremities, lying in the fetal position  Musculoskeletal: No cyanosis,  clubbing  Skin: Sacral and right hip wounds, on the medial aspect of the left knee  Psych: Appears to have underlying dementia    Right hip wound      Medial aspect Left knee     Sacral wound      Data Reviewed:  I have personally reviewed following labs and imaging studies  Micro Results Recent Results (from the past 240 hour(s))  Culture, blood (Routine x 2)     Status: None (Preliminary result)   Collection Time: 03/30/19  5:30 PM   Specimen: BLOOD  Result Value Ref Range Status   Specimen Description   Final    BLOOD RIGHT ANTECUBITAL Performed at [facility], [place] [address], [place], [place] [address]    Special Requests   Final    BOTTLES DRAWN AEROBIC AND ANAEROBIC Blood Culture results may not be optimal due to an excessive volume of blood received in culture bottles Performed at [place] [address], [place], [place] [address]    Culture   Final    NO GROWTH 2 DAYS Performed at [place] [address], [place], [place] [address]    Report Status PENDING  Incomplete  Culture, blood (Routine x 2)     Status: None (Preliminary result)   Collection Time: 03/30/19  5:40 PM   Specimen: BLOOD  Result Value Ref Range Status   Specimen Description   Final    BLOOD LEFT ANTECUBITAL Performed at [place] [address] [place], [place] [address]    Special Requests   Final    BOTTLES DRAWN AEROBIC AND ANAEROBIC Blood Culture results may not be optimal due to an excessive volume of blood received in culture bottles Performed at [facility] [address], [place], [place] [address]    Culture   Final    NO GROWTH 2 DAYS Performed at [place] [facility] [address], [place], [place] [address]  Report Status PENDING  Incomplete  Urine culture     Status: None   Collection Time: 03/30/19  6:01 PM   Specimen: In/Out Cath Urine  Result Value Ref Range  Status   Specimen Description   Final    IN/OUT CATH URINE Performed at [facility], [place] [address], [place], [place] [address]    Special Requests   Final    NONE Performed at [facility], [place] [address], [place], [place] [address]    Culture   Final    NO GROWTH Performed at [facility], [place] [address], [place], [place] [address]    Report Status 04/01/2019 FINAL  Final  Respiratory Panel by RT PCR (Flu A&B, Covid) - Nasopharyngeal Swab     Status: Abnormal   Collection Time: 03/30/19  7:31 PM   Specimen: Nasopharyngeal Swab  Result Value Ref Range Status   SARS Coronavirus 2 by RT PCR POSITIVE (A) NEGATIVE Final    Comment: RESULT CALLED TO, READ BACK BY AND VERIFIED WITH: N,MOSCINSKI AT 2306 ON 03/30/19 BY A,MOHAMED (NOTE) SARS-CoV-2 target nucleic acids are DETECTED. SARS-CoV-2 RNA is generally detectable in upper respiratory specimens  during the acute phase of infection. Positive results are indicative of the presence of the identified virus, but do not rule out bacterial infection or co-infection with other pathogens not detected by the test. Clinical correlation with patient history and other diagnostic information is necessary to determine patient infection status. The expected result is Negative. Fact Sheet for Patients:  PinkCheek.be Fact Sheet for Healthcare Providers: GravelBags.it This test is not yet approved or cleared by the [place] FDA and  has been authorized for detection and/or diagnosis of SARS-CoV-2 by FDA under an Emergency Use Authorization (EUA).  This EUA will remain in effect (meaning this test can be  used) for the duration of  the COVID-19 declaration under Section 564(b)(1) of the Act, 21 U.S.C. section 360bbb-3(b)(1), unless the authorization is terminated or revoked sooner.    Influenza A by PCR NEGATIVE NEGATIVE Final   Influenza B  by PCR NEGATIVE NEGATIVE Final    Comment: (NOTE) The Xpert Xpress SARS-CoV-2/FLU/RSV assay is intended as an aid in  the diagnosis of influenza from Nasopharyngeal swab specimens and  should not be used as a sole basis for treatment. Nasal washings and  aspirates are unacceptable for Xpert Xpress SARS-CoV-2/FLU/RSV  testing. Fact Sheet for Patients: PinkCheek.be Fact Sheet for Healthcare Providers: GravelBags.it This test is not yet approved or cleared by the [place] FDA and  has been authorized for detection and/or diagnosis of SARS-CoV-2 by  FDA under an Emergency Use Authorization (EUA). This EUA will remain  in effect (meaning this test can be used) for the duration of the  Covid-19 declaration under Section 564(b)(1) of the Act, 21  U.S.C. section 360bbb-3(b)(1), unless the authorization is  terminated or revoked. Performed at [facility], [place] [address], [place], [place] [address]     Radiology Reports CT ABDOMEN PELVIS WO CONTRAST  Result Date: 03/30/2019 CLINICAL DATA:  Sepsis abdominal pain and distension EXAM: CT ABDOMEN AND PELVIS WITHOUT CONTRAST TECHNIQUE: Multidetector CT imaging of the abdomen and pelvis was performed following the standard protocol without IV contrast. COMPARISON:  None. FINDINGS: Lower chest: There is mild cardiomegaly. A small left pleural effusion is seen. Patchy airspace opacity seen at the right lung base. The visualized portions of the lungs are clear. Hepatobiliary: Although limited due to the  lack of intravenous contrast, normal in appearance without gross focal abnormality. Layering calcified gallstones are present. Pancreas:  Unremarkable.  No surrounding inflammatory changes. Spleen: Normal in size. Although limited due to the lack of intravenous contrast, normal in appearance. Adrenals/Urinary Tract: Limited visualization of the adrenal glands. The kidneys and  collecting system appear normal without evidence of urinary tract calculus or hydronephrosis. Tiny calcifications seen adjacent to the right renal hilum which could be renal vascular. Bladder is unremarkable. Stomach/Bowel: Although limited the stomach, small bowel and colon are grossly unremarkable. Scattered colonic diverticula are noted. Vascular/Lymphatic: Scattered aortic atherosclerotic calcifications are seen without aneurysmal dilatation. Reproductive: Calcified uterine fibroid is seen. Other: There appears to be diffuse anasarca. Musculoskeletal: Diffuse osteopenia is seen. Bilateral hip osteoarthritis is seen with superior joint space loss and marginal osteophyte formation. IMPRESSION: Small left pleural effusion with patchy airspace opacity which could be due to atelectasis and/or early infectious etiology. Limited examination due to technique and patient's body habitus, however no definite acute intra-abdominal or pelvic pathology. Cholelithiasis Diffuse anasarca Aortic Atherosclerosis (ICD10-I70.0). Electronically Signed   By: Prudencio Pair M.D.   On: 03/30/2019 21:33   DG Shoulder Right  Result Date: 03/08/2019 CLINICAL DATA:  Right shoulder pain, no known injury, initial encounter EXAM: RIGHT SHOULDER - 2+ VIEW COMPARISON:  None. FINDINGS: Degenerative changes of the glenohumeral joint are seen. Some deformity of the proximal humerus is seen likely related to prior fracture with healing. Degenerative changes of the acromioclavicular joint are noted as well. The underlying bony thorax shows no acute abnormality. IMPRESSION: Chronic changes in the shoulder without acute abnormality. Electronically Signed   By: Inez Catalina M.D.   On: 03/08/2019 21:30   CT HEAD WO CONTRAST  Result Date: 03/08/2019 CLINICAL DATA:  Recent falls with headaches and neck pain, initial encounter EXAM: CT HEAD WITHOUT CONTRAST CT CERVICAL SPINE WITHOUT CONTRAST TECHNIQUE: Multidetector CT imaging of the head and cervical  spine was performed following the standard protocol without intravenous contrast. Multiplanar CT image reconstructions of the cervical spine were also generated. COMPARISON:  07/19/2009 FINDINGS: CT HEAD FINDINGS Brain: Heavy calcification of the basal ganglia is seen bilaterally and stable. Increasing chronic white matter ischemic change is noted. Mild atrophic changes are seen as well. Vascular: No hyperdense vessel or unexpected calcification. Skull: Normal. Negative for fracture or focal lesion. Sinuses/Orbits: No acute finding. Other: None. CT CERVICAL SPINE FINDINGS Alignment: Within normal limits. Skull base and vertebrae: Exam is somewhat limited by patient motion artifact. Seven cervical segments are well visualized. Vertebral body height is well maintained. Disc space narrowing is noted from C2-C6 with mild osteophytic changes. No acute fracture or acute facet abnormality is noted. The odontoid is within normal limits. Soft tissues and spinal canal: Surrounding soft tissue structures show no acute abnormality. Upper chest: Visualized lung apices are unremarkable. Other: None IMPRESSION: CT of the head: Chronic atrophic and ischemic changes without acute abnormality. CT of the cervical spine: Somewhat limited exam due to patient motion artifact. Multilevel degenerative change is seen without acute abnormality. Electronically Signed   By: Inez Catalina M.D.   On: 03/08/2019 21:39   CT ANGIO CHEST PE W OR WO CONTRAST  Result Date: 03/31/2019 CLINICAL DATA:  Elevated D-dimer. EXAM: CT ANGIOGRAPHY CHEST WITH CONTRAST TECHNIQUE: Multidetector CT imaging of the chest was performed using the standard protocol during bolus administration of intravenous contrast. Multiplanar CT image reconstructions and MIPs were obtained to evaluate the vascular anatomy. CONTRAST:  100mL OMNIPAQUE IOHEXOL 350 MG/ML  SOLN COMPARISON:  None. FINDINGS: Cardiovascular: Satisfactory opacification of the pulmonary arteries to the  segmental level. Small pulmonary embolus in the right lower lobe segmental pulmonary artery cardiomegaly. Thoracic aortic atherosclerosis. No pericardial effusion. Mediastinum/Nodes: No enlarged mediastinal, hilar, or axillary lymph nodes. Thyroid gland, trachea, and esophagus demonstrate no significant findings. Lungs/Pleura: Small bilateral pleural effusions. Left basilar atelectasis. Mild interstitial prominence. No focal consolidation. No pneumothorax. Upper Abdomen: No acute abnormality. Musculoskeletal: No acute osseous abnormality. No aggressive osseous lesion. Severe dextroscoliosis of the thoracolumbar spine. No aggressive osseous lesion. Severe osteoarthritis of the right glenohumeral joint. Severe osteoarthritis of the sternoclavicular joints. Other: Diffuse anasarca. Review of the MIP images confirms the above findings. IMPRESSION: 1. Small pulmonary embolus in the right lower lobe segmental pulmonary artery cardiomegaly. RV/LV ratio 1.3 which can be seen with right heart strain, but there is likely underlying right heart failure in this patient with low PE burden. 2. Findings concerning for mild CHF.  Diffuse anasarca. 3. Aortic Atherosclerosis (ICD10-I70.0). Critical Value/emergent results were called by telephone at the time of interpretation on 03/31/2019 at 4:29 am to provider [facility] , who verbally acknowledged these results. Electronically Signed   By: Kathreen Devoid   On: 03/31/2019 04:46   CT CERVICAL SPINE WO CONTRAST  Result Date: 03/08/2019 CLINICAL DATA:  Recent falls with headaches and neck pain, initial encounter EXAM: CT HEAD WITHOUT CONTRAST CT CERVICAL SPINE WITHOUT CONTRAST TECHNIQUE: Multidetector CT imaging of the head and cervical spine was performed following the standard protocol without intravenous contrast. Multiplanar CT image reconstructions of the cervical spine were also generated. COMPARISON:  07/19/2009 FINDINGS: CT HEAD FINDINGS Brain: Heavy calcification of the  basal ganglia is seen bilaterally and stable. Increasing chronic white matter ischemic change is noted. Mild atrophic changes are seen as well. Vascular: No hyperdense vessel or unexpected calcification. Skull: Normal. Negative for fracture or focal lesion. Sinuses/Orbits: No acute finding. Other: None. CT CERVICAL SPINE FINDINGS Alignment: Within normal limits. Skull base and vertebrae: Exam is somewhat limited by patient motion artifact. Seven cervical segments are well visualized. Vertebral body height is well maintained. Disc space narrowing is noted from C2-C6 with mild osteophytic changes. No acute fracture or acute facet abnormality is noted. The odontoid is within normal limits. Soft tissues and spinal canal: Surrounding soft tissue structures show no acute abnormality. Upper chest: Visualized lung apices are unremarkable. Other: None IMPRESSION: CT of the head: Chronic atrophic and ischemic changes without acute abnormality. CT of the cervical spine: Somewhat limited exam due to patient motion artifact. Multilevel degenerative change is seen without acute abnormality. Electronically Signed   By: Inez Catalina M.D.   On: 03/08/2019 21:39   DG Chest Port 1 View  Result Date: 03/30/2019 CLINICAL DATA:  Abdominal pain. EXAM: PORTABLE CHEST 1 VIEW COMPARISON:  March 14, 2019 FINDINGS: Moderate to marked severity left basilar atelectasis and/or infiltrate is seen. This is stable in severity when compared to the prior study. A 6 mm calcified nodular opacity is seen overlying the mid right lung. A small left pleural effusion is noted which is mildly increased in size. No pneumothorax is identified. The cardiac silhouette is mildly enlarged and unchanged in size. Radiopaque surgical clips are seen along the lateral aspect of the mid left hemithorax. Degenerative changes seen throughout the thoracic spine with stable moderate to marked severity dextroscoliosis. IMPRESSION: 1. Moderate to marked severity left  basilar atelectasis and/or infiltrate. This is stable in severity when compared to the prior study. 2. Small  left pleural effusion which is mildly increased in size. Electronically Signed   By: Virgina Norfolk M.D.   On: 03/30/2019 18:40   DG CHEST PORT 1 VIEW  Result Date: 03/14/2019 CLINICAL DATA:  Respiratory failure, pneumonia EXAM: PORTABLE CHEST 1 VIEW COMPARISON:  03/11/2019 FINDINGS: Cardiomegaly. Vascular congestion and bilateral interstitial and airspace opacities, worsening since prior study. Suspect small effusions. No acute bony abnormality. IMPRESSION: Worsening bilateral interstitial and airspace opacities, edema versus infection. Small bilateral effusions. Electronically Signed   By: Rolm Baptise M.D.   On: 03/14/2019 08:24   DG CHEST PORT 1 VIEW  Result Date: 03/11/2019 CLINICAL DATA:  86 year old female with history of shortness of breath. EXAM: PORTABLE CHEST 1 VIEW COMPARISON:  Chest x-ray 03/08/2019. FINDINGS: There is cephalization of the pulmonary vasculature and slight indistinctness of the interstitial markings suggestive of mild pulmonary edema. Trace right pleural effusion. Mild cardiomegaly. The patient is rotated to the left on today's exam, resulting in distortion of the mediastinal contours and reduced diagnostic sensitivity and specificity for mediastinal pathology. Aortic atherosclerosis. Surgical clips projecting over the left axillary region, likely from prior lymph node dissection. IMPRESSION: 1. The appearance of the chest is most compatible with mild congestive heart failure, as above. 2. Aortic atherosclerosis. Electronically Signed   By: Vinnie Langton M.D.   On: 03/11/2019 04:50   DG Chest Port 1 View  Result Date: 03/08/2019 CLINICAL DATA:  Fall, fever EXAM: PORTABLE CHEST 1 VIEW COMPARISON:  Radiograph August 30, 2013 FINDINGS: Diffuse hazy interstitial opacities throughout both lungs with more patchy opacity in the right lung base and a small right pleural  effusion. No pneumothorax. Cardiac silhouette is borderline enlarged. The aorta is calcified and tortuous. The osseous structures appear diffusely demineralized which may limit detection of small or nondisplaced fractures. Degenerative changes are present in the imaged spine and shoulders. Stable dextrocurvature of the thoracic spine. Postsurgical changes in the left axilla. IMPRESSION: Diffuse hazy interstitial opacities throughout both lungs with more patchy opacity in the right lung base and a small right pleural effusion. Findings may represent pulmonary edema and/or multifocal pneumonia. Electronically Signed   By: Lovena Le M.D.   On: 03/08/2019 18:19   ECHOCARDIOGRAM COMPLETE  Result Date: 03/09/2019    ECHOCARDIOGRAM REPORT   Patient Name:   Angela [name] Date of Exam: 03/09/2019 Medical Rec #:  [id number]        Height:       63.0 in Accession #:    [id number]       Weight:       89.9 lb Date of Birth:  December 21, 1918        BSA:          1.377 m Patient Age:    8 years         BP:           102/62 mmHg Patient Gender: F                HR:           87 bpm. Exam Location:  Inpatient Procedure: 2D Echo, Color Doppler and Cardiac Doppler Indications:    R06.9 DOE  History:        Patient has no prior history of Echocardiogram examinations.                 Risk Factors:Dyslipidemia. Mastectomy.  Sonographer:    Raquel Sarna Senior RDCS Referring Phys: [phone] [place]  1. Left ventricular ejection  fraction, by estimation, is 60 to 65%. The left ventricle has normal function. The left ventricle has no regional wall motion abnormalities. Left ventricular diastolic parameters are consistent with Grade I diastolic dysfunction (impaired relaxation).  2. Right ventricular systolic function is normal. The right ventricular size is normal. There is moderately elevated pulmonary artery systolic pressure.  3. Left atrial size was moderately dilated.  4. Right atrial size was moderately dilated.  5. The  mitral valve is normal in structure and function. Trivial mitral valve regurgitation.  6. Tricuspid valve regurgitation is moderate.  7. The aortic valve is tricuspid. Aortic valve regurgitation is trivial. Moderate aortic valve stenosis. FINDINGS  Left Ventricle: Left ventricular ejection fraction, by estimation, is 60 to 65%. The left ventricle has normal function. The left ventricle has no regional wall motion abnormalities. The left ventricular internal cavity size was normal in size. There is  no left ventricular hypertrophy. Left ventricular diastolic parameters are consistent with Grade I diastolic dysfunction (impaired relaxation). Right Ventricle: The right ventricular size is normal. No increase in right ventricular wall thickness. Right ventricular systolic function is normal. There is moderately elevated pulmonary artery systolic pressure. The tricuspid regurgitant velocity is 3.26 m/s, and with an assumed right atrial pressure of 3 mmHg, the estimated right ventricular systolic pressure is [id number] mmHg. Left Atrium: Left atrial size was moderately dilated. Right Atrium: Right atrial size was moderately dilated. Pericardium: There is no evidence of pericardial effusion. Mitral Valve: The mitral valve is normal in structure and function. There is mild calcification of the mitral valve leaflet(s). Trivial mitral valve regurgitation. Tricuspid Valve: The tricuspid valve is normal in structure. Tricuspid valve regurgitation is moderate. Aortic Valve: The aortic valve is tricuspid. Aortic valve regurgitation is trivial. Moderate aortic stenosis is present. There is moderate calcification of the aortic valve. Aortic valve mean gradient measures 15.0 mmHg. Aortic valve peak gradient measures 25.8 mmHg. Aortic valve area, by VTI measures 0.72 cm. Pulmonic Valve: The pulmonic valve was normal in structure. Pulmonic valve regurgitation is not visualized. Aorta: The aortic root and ascending aorta are structurally  normal, with no evidence of dilitation. IAS/Shunts: No atrial level shunt detected by color flow Doppler.  LEFT VENTRICLE PLAX 2D LVIDd:         3.50 cm  Diastology LVIDs:         2.60 cm  LV e' lateral:   3.09 cm/s LV PW:         1.20 cm  LV E/e' lateral: 20.0 LV IVS:        1.00 cm  LV e' medial:    2.47 cm/s LVOT diam:     1.80 cm  LV E/e' medial:  25.0 LV SV:         35 LV SV Index:   25 LVOT Area:     2.54 cm  RIGHT VENTRICLE RV S prime:     8.83 cm/s TAPSE (M-mode): 1.4 cm LEFT ATRIUM             Index       RIGHT ATRIUM           Index LA diam:        3.40 cm 2.47 cm/m  RA Area:     18.50 cm LA Vol (A2C):   66.2 ml 48.08 ml/m RA Volume:   58.40 ml  42.42 ml/m LA Vol (A4C):   48.0 ml 34.86 ml/m LA Biplane Vol: 56.8 ml 41.25 ml/m  AORTIC VALVE AV Area (  Vmax):    0.76 cm AV Area (Vmean):   0.72 cm AV Area (VTI):     0.72 cm AV Vmax:           254.00 cm/s AV Vmean:          185.000 cm/s AV VTI:            0.487 m AV Peak Grad:      25.8 mmHg AV Mean Grad:      15.0 mmHg LVOT Vmax:         75.70 cm/s LVOT Vmean:        52.700 cm/s LVOT VTI:          0.137 m LVOT/AV VTI ratio: 0.28  AORTA Ao Root diam: 2.30 cm Ao Asc diam:  2.30 cm MITRAL VALVE                TRICUSPID VALVE MV Area (PHT): 2.87 cm     TR Peak grad:   42.5 mmHg MV Decel Time: 264 msec     TR Vmax:        326.00 cm/s MV E velocity: 61.70 cm/s MV A velocity: 111.00 cm/s  SHUNTS MV E/A ratio:  0.56         Systemic VTI:  0.14 m                             Systemic Diam: 1.80 cm Glori Bickers MD Electronically signed by Glori Bickers MD Signature Date/Time: 03/09/2019/6:22:33 PM    Final    VAS [place] LOWER EXTREMITY VENOUS (DVT)  Result Date: 03/11/2019  Lower Venous DVTStudy Indications: Elevated Ddimer.  Risk Factors: None identified. Limitations: Patient positioning, patient immobility, patient pain tolerance. Comparison Study: No prior studies. Performing Technologist: Oliver Hum RVT  Examination Guidelines: A complete evaluation  includes B-mode imaging, spectral Doppler, color Doppler, and power Doppler as needed of all accessible portions of each vessel. Bilateral testing is considered an integral part of a complete examination. Limited examinations for reoccurring indications may be performed as noted. The reflux portion of the exam is performed with the patient in reverse Trendelenburg.  +---------+---------------+---------+-----------+----------+--------------+ RIGHT    CompressibilityPhasicitySpontaneityPropertiesThrombus Aging +---------+---------------+---------+-----------+----------+--------------+ CFV      Full           Yes      Yes                                 +---------+---------------+---------+-----------+----------+--------------+ SFJ      Full                                                        +---------+---------------+---------+-----------+----------+--------------+ FV Prox  Full                                                        +---------+---------------+---------+-----------+----------+--------------+ FV Mid   Full                                                        +---------+---------------+---------+-----------+----------+--------------+  FV DistalFull                                                        +---------+---------------+---------+-----------+----------+--------------+ PFV      Full                                                        +---------+---------------+---------+-----------+----------+--------------+ POP      Full           Yes      Yes                                 +---------+---------------+---------+-----------+----------+--------------+ PTV      Full                                                        +---------+---------------+---------+-----------+----------+--------------+ PERO     Full                                                         +---------+---------------+---------+-----------+----------+--------------+   +---------+---------------+---------+-----------+----------+--------------+ LEFT     CompressibilityPhasicitySpontaneityPropertiesThrombus Aging +---------+---------------+---------+-----------+----------+--------------+ CFV      Full           Yes      Yes                                 +---------+---------------+---------+-----------+----------+--------------+ SFJ      Full                                                        +---------+---------------+---------+-----------+----------+--------------+ FV Prox  Full                                                        +---------+---------------+---------+-----------+----------+--------------+ FV Mid   Full                                                        +---------+---------------+---------+-----------+----------+--------------+ FV DistalFull                                                        +---------+---------------+---------+-----------+----------+--------------+  PFV      Full                                                        +---------+---------------+---------+-----------+----------+--------------+ POP      Full           Yes      Yes                                 +---------+---------------+---------+-----------+----------+--------------+ PTV      Full                                                        +---------+---------------+---------+-----------+----------+--------------+ PERO     Full                                                        +---------+---------------+---------+-----------+----------+--------------+     Summary: RIGHT: - There is no evidence of deep vein thrombosis in the lower extremity.  - No cystic structure found in the popliteal fossa.  LEFT: - There is no evidence of deep vein thrombosis in the lower extremity.  - No cystic structure found in the popliteal fossa.   *See table(s) above for measurements and observations. Electronically signed by Monica Martinez MD on 03/11/2019 at 11:06:42 AM.    Final     Lab Data:  CBC: Recent Labs  Lab 03/30/19 1730 03/31/19 0555 04/01/19 0826  WBC 2.9* 2.0* 2.0*  NEUTROABS 1.7  --   --   HGB 10.0* 8.6* 11.2*  HCT 34.7* 30.1* 37.8  MCV 93.5 93.8 90.6  PLT 674* 452* [id number]*   Basic Metabolic Panel: Recent Labs  Lab 03/30/19 1730 03/31/19 0555 04/01/19 0011 04/01/19 0826  NA 148* 144  --  145  K 3.7 3.8  --  3.2*  CL 113* 115*  --  109  CO2 24 22  --  24  GLUCOSE 120* 102*  --  72  BUN 27* 23  --  29*  CREATININE 1.36* 1.03* 1.17* 1.11*  CALCIUM 7.9* 6.8*  --  7.7*   GFR: Estimated Creatinine Clearance: 17.8 mL/min (A) (by C-G formula based on SCr of 1.11 mg/dL (H)). Liver Function Tests: Recent Labs  Lab 03/30/19 1730 04/01/19 0826  AST 32 53*  ALT 18 25  ALKPHOS 66 156*  BILITOT 0.4 0.5  PROT 6.6 6.2*  ALBUMIN 2.6* 2.2*   No results for input(s): LIPASE, AMYLASE in the last 168 hours. No results for input(s): AMMONIA in the last 168 hours. Coagulation Profile: Recent Labs  Lab 03/30/19 1730  INR 1.2   Cardiac Enzymes: No results for input(s): CKTOTAL, CKMB, CKMBINDEX, TROPONINI in the last 168 hours. BNP (last 3 results) No results for input(s): PROBNP in the last 8760 hours. HbA1C: No results for input(s): HGBA1C in the last 72 hours. CBG: No results for input(s): GLUCAP in the last 168 hours. Lipid Profile: No results for  input(s): CHOL, HDL, LDLCALC, TRIG, CHOLHDL, LDLDIRECT in the last 72 hours. Thyroid Function Tests: Recent Labs    03/30/19 1730  TSH 12.915*   Anemia Panel: Recent Labs    03/30/19 1801  FERRITIN 172   Urine analysis:    Component Value Date/Time   COLORURINE YELLOW 03/30/2019 1801   APPEARANCEUR CLEAR 03/30/2019 1801   LABSPEC 1.017 03/30/2019 1801   PHURINE 5.0 03/30/2019 1801   GLUCOSEU NEGATIVE 03/30/2019 1801   GLUCOSEU NEGATIVE  01/19/2017 1346   HGBUR SMALL (A) 03/30/2019 1801   BILIRUBINUR NEGATIVE 03/30/2019 1801   BILIRUBINUR neg 03/09/2013 1447   KETONESUR NEGATIVE 03/30/2019 1801   PROTEINUR 100 (A) 03/30/2019 1801   UROBILINOGEN 0.2 01/19/2017 1346   NITRITE NEGATIVE 03/30/2019 1801   LEUKOCYTESUR NEGATIVE 03/30/2019 1801     Raylyn Speckman M.D. [facility] 04/01/2019, 11:39 AM   Call night coverage person covering after 7pm

## 2019-04-01 NOTE — Progress Notes (Signed)
[place] for IV heparin  Indication: pulmonary embolus  Allergies  Allergen Reactions  . Penicillins     REACTION: Question of penicillin allergy Did it involve swelling of the face/tongue/throat, SOB, or low BP? Unknown Did it involve sudden or severe rash/hives, skin peeling, or any reaction on the inside of your mouth or nose? Unknown Did you need to seek medical attention at a hospital or doctor's office? Unknown When did it last happen?Unknown If all above answers are "NO", may proceed with cephalosporin use.    Patient Measurements: Height: 5\' 3"  (160 cm) Weight: 89 lb 15.2 oz (40.8 kg) IBW/kg (Calculated) : 52.4  Vital Signs: Temp: 95.2 F (35.1 C) (03/28 1802) Temp Source: Rectal (03/28 1802) BP: 144/72 (03/28 2016) Pulse Rate: 71 (03/28 2016)  Labs: Recent Labs    03/30/19 1730 03/30/19 1730 03/31/19 0555 03/31/19 1400 04/01/19 0011 04/01/19 0826 04/01/19 1905  HGB 10.0*   < > 8.6*  --   --  11.2*  --   HCT 34.7*  --  30.1*  --   --  37.8  --   PLT 674*  --  452*  --   --  678*  --   APTT 43*  --   --   --   --   --   --   LABPROT 15.5*  --   --   --   --   --   --   INR 1.2  --   --   --   --   --   --   HEPARINUNFRC  --   --   --    < > 0.55 0.79* 0.52  CREATININE 1.36*   < > 1.03*  --  1.17* 1.11*  --    < > = values in this interval not displayed.   Estimated Creatinine Clearance: 17.8 mL/min (A) (by C-G formula based on SCr of 1.11 mg/dL (H)).  Medical History: Past Medical History:  Diagnosis Date  . Adenocarcinoma of breast ([place])   . Allergic rhinitis   . Anemia   . Anxiety   . Depression   . Rheumatoid arthritis(714.0)    Medications:  Scheduled:  . vitamin C  500 mg Oral Daily  . dexamethasone (DECADRON) injection  6 mg Intravenous Q24H  . furosemide  40 mg Intravenous Q12H  . levothyroxine  25 mcg Oral Q0600  . vancomycin variable dose per unstable renal function (pharmacist dosing)   Does  not apply See admin instructions  . zinc sulfate  220 mg Oral Daily   Infusions:  . ceFEPime (MAXIPIME) IV 2 g (04/01/19 1835)  . heparin 600 Units/hr (04/01/19 1840)  . remdesivir 100 mg in NS 100 mL 100 mg (04/01/19 1313)   Assessment: 86 yo female with COVID PNA, ddimer > 5, now found to have a PE per CT scan to start IV heparin per pharmacy dosing. Note that patient received 5000 units of SQ heparin at 2200 3/26. Baseline labs 3/26 PM. Heparin begun 3/27 at 0543 at 600 units/hr, with no loading dose  Goal of Therapy:  Heparin level 0.3-0.7 units/ml Monitor platelets by anticoagulation protocol: Yes   Today, 04/01/2019 0011 Hep level = 0.55 units/ml on 750 units/hr 0826 Hep level = 0.79 units/ml   Plan:   Continue Heparin at 600 units/hr  Difficult stick  Daily CBC, daily Hep level  Minda Ditto PharmD 04/01/2019,8:20 PM

## 2019-04-01 NOTE — Progress Notes (Signed)
Estill Bamberg RN from ICU spoke with this RN about recent vital signs. The Hospitalist has canceled the transfer to ICU.  Will continue to monitor this patient.

## 2019-04-01 NOTE — Progress Notes (Signed)
MD notified of patients continued low temp, continues to be 94.8 rectally.  Blankets, heat packs, and room temp increased.  MD entering orders for patient to tx to stepdown for warming blanket

## 2019-04-01 NOTE — Progress Notes (Signed)
  Echocardiogram 2D Echocardiogram has been performed.  Angela [name] 04/01/2019, 9:44 AM

## 2019-04-01 NOTE — Progress Notes (Signed)
Brief Pharmacy Consult Note - IV heparin  Labs: heparin level 0.55  A/P: heparin level therapeutic (goal 0.3-0.7) for PE on current IV heparin rate of 750 units/hr. NO reported bleeding. Continue same heparin rate. Recheck heparin level at 0800.   Adrian Saran, PharmD, BCPS 04/01/2019 12:58 AM

## 2019-04-01 NOTE — Progress Notes (Signed)
Angela [name] for IV heparin  Indication: pulmonary embolus  Allergies  Allergen Reactions  . Penicillins     REACTION: Question of penicillin allergy Did it involve swelling of the face/tongue/throat, SOB, or low BP? Unknown Did it involve sudden or severe rash/hives, skin peeling, or any reaction on the inside of your mouth or nose? Unknown Did you need to seek medical attention at a hospital or doctor's office? Unknown When did it last happen?Unknown If all above answers are "NO", may proceed with cephalosporin use.    Patient Measurements: Height: 5\' 3"  (160 cm) Weight: 89 lb 15.2 oz (40.8 kg) IBW/kg (Calculated) : 52.4  Vital Signs:    Labs: Recent Labs    03/30/19 1730 03/30/19 1730 03/31/19 0555 03/31/19 1400 04/01/19 0011 04/01/19 0826  HGB 10.0*   < > 8.6*  --   --  11.2*  HCT 34.7*  --  30.1*  --   --  37.8  PLT 674*  --  452*  --   --  678*  APTT 43*  --   --   --   --   --   LABPROT 15.5*  --   --   --   --   --   INR 1.2  --   --   --   --   --   HEPARINUNFRC  --   --   --  0.16* 0.55 0.79*  CREATININE 1.36*   < > 1.03*  --  1.17* 1.11*   < > = values in this interval not displayed.   Estimated Creatinine Clearance: 17.8 mL/min (A) (by C-G formula based on SCr of 1.11 mg/dL (H)).  Medical History: Past Medical History:  Diagnosis Date  . Adenocarcinoma of breast ([place])   . Allergic rhinitis   . Anemia   . Anxiety   . Depression   . Rheumatoid arthritis(714.0)    Medications:  Scheduled:  . vitamin C  500 mg Oral Daily  . dexamethasone (DECADRON) injection  6 mg Intravenous Q24H  . furosemide  40 mg Intravenous Daily  . levothyroxine  25 mcg Oral Q0600  . vancomycin variable dose per unstable renal function (pharmacist dosing)   Does not apply See admin instructions  . zinc sulfate  220 mg Oral Daily   Infusions:  . ceFEPime (MAXIPIME) IV 2 g (03/31/19 1846)  . heparin 750 Units/hr (03/31/19 1544)  .  remdesivir 100 mg in NS 100 mL 100 mg (03/31/19 1140)   Assessment: 86 yo female with COVID PNA, ddimer > 5, now found to have a PE per CT scan to start IV heparin per pharmacy dosing. Note that patient received 5000 units of SQ heparin at 2200 3/26. Baseline labs 3/26 PM. Heparin begun 3/27 at 0543 at 600 units/hr, with no loading dose  Goal of Therapy:  Heparin level 0.3-0.7 units/ml Monitor platelets by anticoagulation protocol: Yes   Today, 04/01/2019 0011 Hep level = 0.55 units/ml on 750 units/hr 0826 Hep level = 0.79 units/ml   Plan:   Decrease Heparin to 600 units/hr  Recheck Hep leve at 1800, also draw random Vancomycin level at same time  Daily CBC, order daily Hep level at steady state  Minda Ditto PharmD 04/01/2019,9:30 AM

## 2019-04-01 NOTE — Progress Notes (Addendum)
Per recent vitals, on call hospitalist provider paged to question if transfer to stepdown unit still appropriate for this patient. Discussed also with current RN providing care, Mechele Claude, who agreed it would be in patient's best interest to stay in 1435. Awaiting response from provider. 2058: Spoke to on call provider who will cancel transfer.

## 2019-04-01 NOTE — Consult Note (Signed)
Palliative Care Progress Note  Reason for Consult: Goals of care in light of COVID 19 with hypothermia  Palliative Care Consult Received.  Chart Reviewed including personal review of pertinent labs and imaging.  I met today with Angela [name].  She is acutely ill but is awake and alert.  She is not able to carry on conversation regarding goals of care, and she reports that she is cold and feels "terrible" but she cannot further clarify.  I called and was able to reach her son Angela [name].  He reports that she had 8 children but one has since passed.  He is the closest to his mother and her caregiver.  Angela [name] reports that he helps to make decisions along with his sister Angela [name] in regard to his mothers medical care.    We discussed her clinical course as well as wishes moving forward in regard to care plan in light of her COVID infection with sepsis and hypothermia.  We discussed difference between a aggressive medical intervention path and a palliative, comfort focused care path.  Angela [name] reports that his mother has been having some physical decline, but she is "100% with her mental status" and he reports that he believes that her desire would be to continue aggressive interventions at this point in time.  He asked me to pass on message to his mother that he is thinking of and loves her.    I then spoke with patient's daughter, Angela [name] who confirmed that Ms. Treichler would desire for continuation of aggressive care plan and would not desire shift in focus to comfort.  Questions and concerns addressed.   PMT will continue to support holistically.  - Discussed with patient's children today.  Plan for continuation of current care plan.  Will continue to follow and progress conversation based on clinical course.  Time in: 1630 Time out: 1730  Greater than 50%  of this time was spent counseling and coordinating care related to the above assessment and plan.  Micheline Rough, MD [place]  Team [phone]

## 2019-04-01 NOTE — Progress Notes (Signed)
Consult was placed to IV Therapy for an iv restart; pt is on a heparin drip,  and is receiving remdesivir- this requires 2 separate iv sites;  Upon assessing the pt, found the iv in the right ac to be out of the vein, under the dressing;  Was able to restart one of the ivs on the 2nd attempt;  This writer attempted 2 more times in an effort to place a 2nd site, but was unable;  Pt's hands and extremities are cold;  She has very limited range of motion and is unable to turn her arms to assess well; suggest calling anesthesia dept or ER to have another RN attempt iv access;  Ultrasound was not used as pt's arms are very thin, and veins are visible, but multiple valves are noted.  Pt has one working IV at this time;

## 2019-04-01 NOTE — Progress Notes (Signed)
Pt BP 179/90, HR 73, Temp 94.7 rectally, Resp 24. MD notified. Will continue to monitor.

## 2019-04-02 ENCOUNTER — Telehealth: Payer: Self-pay

## 2019-04-02 LAB — DIFFERENTIAL
Abs Immature Granulocytes: 0.03 10*3/uL (ref 0.00–0.07)
Basophils Absolute: 0 10*3/uL (ref 0.0–0.1)
Basophils Relative: 1 %
Eosinophils Absolute: 0 10*3/uL (ref 0.0–0.5)
Eosinophils Relative: 1 %
Immature Granulocytes: 2 %
Lymphocytes Relative: 19 %
Lymphs Abs: 0.4 10*3/uL — ABNORMAL LOW (ref 0.7–4.0)
Monocytes Absolute: 0.3 10*3/uL (ref 0.1–1.0)
Monocytes Relative: 18 %
Neutro Abs: 1.1 10*3/uL — ABNORMAL LOW (ref 1.7–7.7)
Neutrophils Relative %: 59 %

## 2019-04-02 LAB — CBC
HCT: 42.9 % (ref 36.0–46.0)
HCT: 44.2 % (ref 36.0–46.0)
Hemoglobin: 12.5 g/dL (ref 12.0–15.0)
Hemoglobin: 12.7 g/dL (ref 12.0–15.0)
MCH: 26.9 pg (ref 26.0–34.0)
MCH: 27 pg (ref 26.0–34.0)
MCHC: 29.1 g/dL — ABNORMAL LOW (ref 30.0–36.0)
MCHC: 28.7 g/dL — ABNORMAL LOW (ref 30.0–36.0)
MCV: 92.3 fL (ref 80.0–100.0)
MCV: 94 fL (ref 80.0–100.0)
Platelets: 745 10*3/uL — ABNORMAL HIGH (ref 150–400)
Platelets: 759 10*3/uL — ABNORMAL HIGH (ref 150–400)
RBC: 4.65 MIL/uL (ref 3.87–5.11)
RBC: 4.7 MIL/uL (ref 3.87–5.11)
RDW: 22.4 % — ABNORMAL HIGH (ref 11.5–15.5)
RDW: 22.7 % — ABNORMAL HIGH (ref 11.5–15.5)
WBC: 1.8 10*3/uL — ABNORMAL LOW (ref 4.0–10.5)
WBC: 1.9 10*3/uL — ABNORMAL LOW (ref 4.0–10.5)
nRBC: 0 % (ref 0.0–0.2)
nRBC: 1.1 % — ABNORMAL HIGH (ref 0.0–0.2)

## 2019-04-02 LAB — HEPARIN LEVEL (UNFRACTIONATED): Heparin Unfractionated: 0.44 IU/mL (ref 0.30–0.70)

## 2019-04-02 LAB — BASIC METABOLIC PANEL
Anion gap: 15 (ref 5–15)
BUN: 31 mg/dL — ABNORMAL HIGH (ref 8–23)
CO2: 23 mmol/L (ref 22–32)
Calcium: 8 mg/dL — ABNORMAL LOW (ref 8.9–10.3)
Chloride: 108 mmol/L (ref 98–111)
Creatinine, Ser: 1.05 mg/dL — ABNORMAL HIGH (ref 0.44–1.00)
GFR calc Af Amer: 51 mL/min — ABNORMAL LOW (ref >60)
GFR calc non Af Amer: 44 mL/min — ABNORMAL LOW (ref >60)
Glucose, Bld: 75 mg/dL (ref 70–99)
Potassium: 4 mmol/L (ref 3.5–5.1)
Sodium: 146 mmol/L — ABNORMAL HIGH (ref 135–145)

## 2019-04-02 LAB — D-DIMER, QUANTITATIVE: D-Dimer, Quant: 2.72 ug/mL-FEU — ABNORMAL HIGH (ref 0.00–0.50)

## 2019-04-02 LAB — FERRITIN: Ferritin: 396 ng/mL — ABNORMAL HIGH (ref 11–307)

## 2019-04-02 LAB — C-REACTIVE PROTEIN: CRP: 9.3 mg/dL — ABNORMAL HIGH (ref <1.0)

## 2019-04-02 LAB — PROCALCITONIN: Procalcitonin: 0.61 ng/mL

## 2019-04-02 MED ORDER — ONDANSETRON HCL 4 MG/2ML IJ SOLN
4.0000 mg | Freq: Four times a day (QID) | INTRAMUSCULAR | Status: DC | PRN
  Administered 2019-04-02 – 2019-04-10 (×3): 4 mg via INTRAVENOUS
  Filled 2019-04-02 (×4): qty 2

## 2019-04-02 MED ORDER — ENSURE ENLIVE PO LIQD
237.0000 mL | Freq: Two times a day (BID) | ORAL | Status: DC
  Administered 2019-04-03 – 2019-04-14 (×7): 237 mL via ORAL

## 2019-04-02 MED ORDER — ADULT MULTIVITAMIN W/MINERALS CH
1.0000 | ORAL_TABLET | Freq: Every day | ORAL | Status: DC
  Administered 2019-04-02 – 2019-04-14 (×7): 1 via ORAL
  Filled 2019-04-02 (×11): qty 1

## 2019-04-02 NOTE — Progress Notes (Signed)
Pharmacy Antibiotic Note  Angela [name] is a 86 y.o. female admitted on 03/30/2019 with sepsis due to HCAP and COVID-19 PNA.  Pharmacy has been consulted for vancomycin and cefepime dosing. Vancomycin was stopped 3/28 due to negative MRSA PCR.   Plan:  Continue cefepime 2 g IV q24 hr  F/U renal function, final culture results, planned duration of therapy   Height: 5\' 3"  (160 cm) Weight: 89 lb 15.2 oz (40.8 kg) IBW/kg (Calculated) : 52.4  Temp (24hrs), Avg:95.7 F (35.4 C), Min:94.7 F (34.8 C), Max:96.4 F (35.8 C)  Recent Labs  Lab 03/30/19 1730 03/30/19 1931 03/31/19 0555 04/01/19 0011 04/01/19 0826 04/01/19 1905 04/02/19 0422  WBC 2.9*  --  2.0*  --  2.0*  --  1.8*  CREATININE 1.36*  --  1.03* 1.17* 1.11*  --  1.05*  LATICACIDVEN 3.1* 1.9  --   --   --   --   --   VANCORANDOM  --   --   --   --   --  8  --     Estimated Creatinine Clearance: 18.8 mL/min (A) (by C-G formula based on SCr of 1.05 mg/dL (H)).    Allergies  Allergen Reactions  . Penicillins     REACTION: Question of penicillin allergy Did it involve swelling of the face/tongue/throat, SOB, or low BP? Unknown Did it involve sudden or severe rash/hives, skin peeling, or any reaction on the inside of your mouth or nose? Unknown Did you need to seek medical attention at a hospital or doctor's office? Unknown When did it last happen?Unknown If all above answers are "NO", may proceed with cephalosporin use.     Antimicrobials this admission: 3/26 vanc >> 3/28 3/26 cefepime >>   Dose adjustments this admission: n/a  Microbiology results: 3/26 BCx: NGTD 3/26 UCx: NGF   Thank you for allowing pharmacy to be a part of this patient's care.  Ulice Dash D 04/02/2019 11:45 AM

## 2019-04-02 NOTE — Care Management Important Message (Signed)
Important Message  Patient Details IM Letter given to Gabriel Earing RN Case Manager to present to the Patient Name: Angela [name] MRN: [id number] Date of Birth: Jan 04, 1919   Medicare Important Message Given:  Yes     Kerin Salen 04/02/2019, 11:12 AM

## 2019-04-02 NOTE — TOC Progression Note (Signed)
Transition of Care [facility]) - Progression Note    Patient Details  Name: Angela [name] MRN: [id number] Date of Birth: [birth year]  Transition of Care [facility]) CM/SW Contact  Purcell Mouton, RN Phone Number: 04/02/2019, 3:32 PM  Clinical Narrative:     Spoke with pt's daughter Angela [name] concerning discharge plans. Pt will go back home with family, with sitters. Daughter asked if pt would receive a COVID vaccination here at hospital. Explained that hospital is not giving vaccinations to pat's. Angela [name] asked about pt coming home what she should do. Encouraged mask, handwashing and for her to receive COVID vaccination. Also that pt's RN will go over everything with her when pt is discharged.       Expected Discharge Plan and Services                                                 Social Determinants of Health (SDOH) Interventions    Readmission Risk Interventions No flowsheet data found.

## 2019-04-02 NOTE — Progress Notes (Addendum)
[facility]                                                                              Patient Demographics  Angela [name], is a 86 y.o. female, DOB - 1918-01-21, [id number]  Admit date - 03/30/2019   Admitting Physician Shela Leff, MD  Outpatient Primary MD for the patient is [name], MD  Outpatient specialists:   LOS - 3  days   Medical records reviewed and are as summarized below:    Chief Complaint  Patient presents with   Code Sepsis   Wound Check       Brief summary   Patient is a 86 year old female with history of breast CA status post mastectomy, anemia, anxiety, rheumatoid arthritis, hypothyroidism, recent hospital admission 3/4-3/10/2019 (secondary to multifocal pneumonia, hyponatremia, AKI, acute diastolic CHF, AVNRT) presented to ED with abdominal pain, fevers, 103.1 F, hypotensive BP 88/60, tachycardia 130, tachypnea with respiratory rate of 40.  O2 sats 98% on room air Patient was also found to have wound in the buttocks, sacral region Chest x-ray showed moderate to marked severity left basilar atelectasis and/or infiltrates.  Patient was placed on vancomycin, cefepime, metronidazole. COVID-19 positive  Assessment & Plan    Principal Problem:  Acute hypoxic respiratory failure due to acute COVID-19 viral pneumonia during the ongoing COVID-19 pandemic- POA, acute PE, sepsis secondary to HCAP, hypothermia - Patient presented with SIRS, hypotension, tachycardia, tachypnea, COVID-19 positive, chest x-ray showed moderate to marked severity left basilar atelectasis/infiltrates  -Continue IV Decadron, remdesivir day #3 today  -Continue  IV cefepime.  Vancomycin discontinued.  Leukopenia likely due to COVID-19 -CRP trending down 9.3, no worsening hypoxia -CT angiogram chest showed small pulmonary embolus in the right lower lobe in the segmental pulmonary artery, right heart strain.  Continue IV heparin drip - Continue Supportive  care: vitamin C/zinc, albuterol, Tylenol. - Continue to wean oxygen, ambulatory O2 screening daily as tolerated  - Oxygen - SpO2: 100 % O2 Flow Rate (L/min): 2 L/min - Continue to follow labs as below  Lab Results  Component Value Date   SARSCOV2NAA POSITIVE (A) 03/30/2019   [place] NEGATIVE 03/14/2019   [place] NEGATIVE 03/08/2019     Recent Labs  Lab 03/30/19 1730 03/30/19 1801 04/01/19 0826 04/01/19 1905 04/02/19 0422  DDIMER  --  5.31* 2.84*  --  2.72*  FERRITIN  --  172  --  418* 396*  CRP  --  10.5*  --  9.3* 9.3*  ALT 18  --  25  --   --   PROCALCITON 0.23  --   --   --   --        Active Problems:  Acute pulmonary embolism -CT angiogram of the chest showed acute right lower lobe PE, with possible right heart strain. -Continue IV heparin drip, case management consult for co-pays for NOAC - Unable to obtain venous Dopplers due to contractures, will not change the management -2D echo showed EF of 55 to 60%, mildly reduced right ventricular systolic function, moderately elevated PA pressure    Acute diastolic CHF (congestive heart failure) ([place]) -2D echo 03/09/2019 showed  normal LV function grade 1 diastolic function however appears to be volume overloaded with significant bilateral lower extremity edema, JVD. -Improving but still volume overloaded, 2+ pitting edema in the lower extremities  -CT angiogram of the chest shows PE with possible right heart strain -Still positive balance of 670 cc, continue IV Lasix 40 mg every 12 hours function, currently stable     AKI (acute kidney injury) ([place]) -Possibly due to acute CHF, hypotension and decreased renal perfusion, COVID-19 pneumonia -Creatinine improving, 1.0  Hypernatremia Sodium 148 at the time of admission, currently 146  Hypothyroidism TSH 12.9, continue Synthroid -Will need thyroid studies in 4 to 6 weeks  Pressure Injury Documentation: Right hip lateral stage II Mid sacrum stage II, medial  aspect of the left knee -Wound care per nursing, wound care consulted, appreciate recommendations  Moderate to severe protein calorie malnutrition Estimated body mass index is 15.93 kg/m as calculated from the following:   Height as of this encounter: 5\' 3"  (1.6 m).   Weight as of this encounter: 40.8 kg.   Goals of care Advanced age with COVID-19 pneumonia, acute respiratory failure with multiple other comorbidities, palliative medicine following  Code Status: DNR DVT Prophylaxis: Heparin drip Family Communication: Discussed all imaging results, lab results, explained to the patient.  Called pt's son, Ezella Beas, did not pick up phone, left detailed message   Disposition Plan: Patient from home.  Barriers to discharge, currently pneumonia with acute CHF, COVID-19 respiratory failure   Time Spent in minutes 25 minutes  Procedures:  CT angiogram chest 2D echo  Consultants:   Palliative medicine  Antimicrobials:   Anti-infectives (From admission, onward)   Start     Dose/Rate Route Frequency Ordered Stop   03/31/19 1800  ceFEPIme (MAXIPIME) 2 g in sodium chloride 0.9 % 100 mL IVPB     2 g 200 mL/hr over 30 Minutes Intravenous Every 24 hours 03/30/19 2117     03/31/19 1000  remdesivir 100 mg in sodium chloride 0.9 % 100 mL IVPB     100 mg 200 mL/hr over 30 Minutes Intravenous Daily 03/31/19 0002 04/04/19 0959   03/31/19 0100  remdesivir 100 mg in sodium chloride 0.9 % 100 mL IVPB     100 mg 200 mL/hr over 30 Minutes Intravenous Every 1 hr x 2 03/31/19 0002 03/31/19 0152   03/30/19 2113  vancomycin variable dose per unstable renal function (pharmacist dosing)  Status:  Discontinued      Does not apply See admin instructions 03/30/19 2117 04/01/19 2333   03/30/19 1800  ceFEPIme (MAXIPIME) 2 g in sodium chloride 0.9 % 100 mL IVPB     2 g 200 mL/hr over 30 Minutes Intravenous  Once 03/30/19 1751 03/30/19 1845   03/30/19 1800  metroNIDAZOLE (FLAGYL) IVPB 500 mg     500  mg 100 mL/hr over 60 Minutes Intravenous  Once 03/30/19 1751 03/30/19 1945   03/30/19 1800  vancomycin (VANCOCIN) IVPB 1000 mg/200 mL premix     1,000 mg 200 mL/hr over 60 Minutes Intravenous  Once 03/30/19 1751 03/30/19 2044         Medications  Scheduled Meds:  vitamin C  500 mg Oral Daily   dexamethasone (DECADRON) injection  6 mg Intravenous Q24H   feeding supplement (ENSURE ENLIVE)  237 mL Oral BID BM   furosemide  40 mg Intravenous Q12H   levothyroxine  25 mcg Oral Q0600   multivitamin with minerals  1 tablet Oral Daily   zinc sulfate  220 mg Oral Daily   Continuous Infusions:  ceFEPime (MAXIPIME) IV 2 g (04/01/19 1835)   heparin 600 Units/hr (04/01/19 1840)   remdesivir 100 mg in NS 100 mL 100 mg (04/01/19 1313)   PRN Meds:.acetaminophen **OR** acetaminophen, benzonatate, fluticasone      Subjective:   Angela [name] was seen and examined today.  Currently alert and awake, hypothermia improving.  No acute hypoxia.  Still volume overloaded, lower extremities edema.   Patient denies dizziness, chest pain, abdominal pain, N/V/D/C, new weakness, numbess, tingling.   Objective:   Vitals:   04/01/19 2354 04/02/19 0121 04/02/19 0445 04/02/19 1232  BP: 134/62 (!) 141/80 (!) 144/82 (!) 146/66  Pulse: 70 69 74 67  Resp: (!) 22 20  18   Temp: (!) 96.2 F (35.7 C) (!) 96.4 F (35.8 C) (!) 96.2 F (35.7 C) (!) 97.5 F (36.4 C)  TempSrc: Rectal Rectal Rectal Oral  SpO2: 97% 96% 97% 100%  Weight:      Height:        Intake/Output Summary (Last 24 hours) at 04/02/2019 1237 Last data filed at 04/02/2019 0144 Gross per 24 hour  Intake 701.46 ml  Output 1300 ml  Net -598.54 ml     Wt Readings from Last 3 Encounters:  03/30/19 40.8 kg  03/08/19 40.8 kg   Physical Exam  General: Alert and oriented x 3, NAD  Cardiovascular: S1 S2 clear, RRR.  2+ pedal edema b/l  Respiratory: CTAB, no wheezing, rales or rhonchi  Gastrointestinal: Soft, nontender,  nondistended, NBS  Ext: 2+ pitting edema pedal edema bilaterally  Neuro: Contractures in the upper and lower extremities, difficult to assess  Musculoskeletal: No cyanosis, clubbing  Skin: Sacral, right hip wounds, medial aspect of the left knee  Psych: Has underlying dementia    Right hip wound      Medial aspect Left knee     Sacral wound      Data Reviewed:  I have personally reviewed following labs and imaging studies  Micro Results Recent Results (from the past 240 hour(s))  Culture, blood (Routine x 2)     Status: None (Preliminary result)   Collection Time: 03/30/19  5:30 PM   Specimen: BLOOD  Result Value Ref Range Status   Specimen Description BLOOD RIGHT ANTECUBITAL  Final   Special Requests   Final    BOTTLES DRAWN AEROBIC AND ANAEROBIC Blood Culture results may not be optimal due to an excessive volume of blood received in culture bottles Performed at [facility], [place] [address], [place], [place] [address]    Culture NO GROWTH 3 DAYS  Final   Report Status PENDING  Incomplete  Culture, blood (Routine x 2)     Status: None (Preliminary result)   Collection Time: 03/30/19  5:40 PM   Specimen: BLOOD  Result Value Ref Range Status   Specimen Description BLOOD LEFT ANTECUBITAL  Final   Special Requests   Final    BOTTLES DRAWN AEROBIC AND ANAEROBIC Blood Culture results may not be optimal due to an excessive volume of blood received in culture bottles Performed at [facility], [place] [address] [place], [place] [address]    Culture NO GROWTH 3 DAYS  Final   Report Status PENDING  Incomplete  Urine culture     Status: None   Collection Time: 03/30/19  6:01 PM   Specimen: In/Out Cath Urine  Result Value Ref Range Status   Specimen Description   Final  IN/OUT CATH URINE Performed at [facility], [place] [address], [place], [place] [address]    Special Requests   Final    NONE Performed at  [facility], [place] [address], [place], [place] [address]    Culture   Final    NO GROWTH Performed at [facility], [place] [address] [place], [place] [address]    Report Status 04/01/2019 FINAL  Final  Respiratory Panel by RT PCR (Flu A&B, Covid) - Nasopharyngeal Swab     Status: Abnormal   Collection Time: 03/30/19  7:31 PM   Specimen: Nasopharyngeal Swab  Result Value Ref Range Status   SARS Coronavirus 2 by RT PCR POSITIVE (A) NEGATIVE Final    Comment: RESULT CALLED TO, READ BACK BY AND VERIFIED WITH: N,MOSCINSKI AT 2306 ON 03/30/19 BY A,MOHAMED (NOTE) SARS-CoV-2 target nucleic acids are DETECTED. SARS-CoV-2 RNA is generally detectable in upper respiratory specimens  during the acute phase of infection. Positive results are indicative of the presence of the identified virus, but do not rule out bacterial infection or co-infection with other pathogens not detected by the test. Clinical correlation with patient history and other diagnostic information is necessary to determine patient infection status. The expected result is Negative. Fact Sheet for Patients:  PinkCheek.be Fact Sheet for Healthcare Providers: GravelBags.it This test is not yet approved or cleared by the [place] FDA and  has been authorized for detection and/or diagnosis of SARS-CoV-2 by FDA under an Emergency Use Authorization (EUA).  This EUA will remain in effect (meaning this test can be  used) for the duration of  the COVID-19 declaration under Section 564(b)(1) of the Act, 21 U.S.C. section 360bbb-3(b)(1), unless the authorization is terminated or revoked sooner.    Influenza A by PCR NEGATIVE NEGATIVE Final   Influenza B by PCR NEGATIVE NEGATIVE Final    Comment: (NOTE) The Xpert Xpress SARS-CoV-2/FLU/RSV assay is intended as an aid in  the diagnosis of influenza from Nasopharyngeal swab specimens and  should not be  used as a sole basis for treatment. Nasal washings and  aspirates are unacceptable for Xpert Xpress SARS-CoV-2/FLU/RSV  testing. Fact Sheet for Patients: PinkCheek.be Fact Sheet for Healthcare Providers: GravelBags.it This test is not yet approved or cleared by the [place] FDA and  has been authorized for detection and/or diagnosis of SARS-CoV-2 by  FDA under an Emergency Use Authorization (EUA). This EUA will remain  in effect (meaning this test can be used) for the duration of the  Covid-19 declaration under Section 564(b)(1) of the Act, 21  U.S.C. section 360bbb-3(b)(1), unless the authorization is  terminated or revoked. Performed at [facility], [place] [address] [place], [place] [address]   MRSA PCR Screening     Status: None   Collection Time: 04/01/19  9:36 PM   Specimen: Nasal Mucosa; Nasopharyngeal  Result Value Ref Range Status   MRSA by PCR NEGATIVE NEGATIVE Final    Comment:        The GeneXpert MRSA Assay (FDA approved for NASAL specimens only), is one component of a comprehensive MRSA colonization surveillance program. It is not intended to diagnose MRSA infection nor to guide or monitor treatment for MRSA infections. Performed at [facility], [place] [address], [place], [place] [address]     Radiology Reports CT ABDOMEN PELVIS WO CONTRAST  Result Date: 03/30/2019 CLINICAL DATA:  Sepsis abdominal pain and distension EXAM: CT ABDOMEN AND PELVIS WITHOUT CONTRAST TECHNIQUE: Multidetector  CT imaging of the abdomen and pelvis was performed following the standard protocol without IV contrast. COMPARISON:  None. FINDINGS: Lower chest: There is mild cardiomegaly. A small left pleural effusion is seen. Patchy airspace opacity seen at the right lung base. The visualized portions of the lungs are clear. Hepatobiliary: Although limited due to the lack of intravenous contrast,  normal in appearance without gross focal abnormality. Layering calcified gallstones are present. Pancreas:  Unremarkable.  No surrounding inflammatory changes. Spleen: Normal in size. Although limited due to the lack of intravenous contrast, normal in appearance. Adrenals/Urinary Tract: Limited visualization of the adrenal glands. The kidneys and collecting system appear normal without evidence of urinary tract calculus or hydronephrosis. Tiny calcifications seen adjacent to the right renal hilum which could be renal vascular. Bladder is unremarkable. Stomach/Bowel: Although limited the stomach, small bowel and colon are grossly unremarkable. Scattered colonic diverticula are noted. Vascular/Lymphatic: Scattered aortic atherosclerotic calcifications are seen without aneurysmal dilatation. Reproductive: Calcified uterine fibroid is seen. Other: There appears to be diffuse anasarca. Musculoskeletal: Diffuse osteopenia is seen. Bilateral hip osteoarthritis is seen with superior joint space loss and marginal osteophyte formation. IMPRESSION: Small left pleural effusion with patchy airspace opacity which could be due to atelectasis and/or early infectious etiology. Limited examination due to technique and patient's body habitus, however no definite acute intra-abdominal or pelvic pathology. Cholelithiasis Diffuse anasarca Aortic Atherosclerosis (ICD10-I70.0). Electronically Signed   By: Prudencio Pair M.D.   On: 03/30/2019 21:33   DG Shoulder Right  Result Date: 03/08/2019 CLINICAL DATA:  Right shoulder pain, no known injury, initial encounter EXAM: RIGHT SHOULDER - 2+ VIEW COMPARISON:  None. FINDINGS: Degenerative changes of the glenohumeral joint are seen. Some deformity of the proximal humerus is seen likely related to prior fracture with healing. Degenerative changes of the acromioclavicular joint are noted as well. The underlying bony thorax shows no acute abnormality. IMPRESSION: Chronic changes in the shoulder  without acute abnormality. Electronically Signed   By: Inez Catalina M.D.   On: 03/08/2019 21:30   CT HEAD WO CONTRAST  Result Date: 03/08/2019 CLINICAL DATA:  Recent falls with headaches and neck pain, initial encounter EXAM: CT HEAD WITHOUT CONTRAST CT CERVICAL SPINE WITHOUT CONTRAST TECHNIQUE: Multidetector CT imaging of the head and cervical spine was performed following the standard protocol without intravenous contrast. Multiplanar CT image reconstructions of the cervical spine were also generated. COMPARISON:  07/19/2009 FINDINGS: CT HEAD FINDINGS Brain: Heavy calcification of the basal ganglia is seen bilaterally and stable. Increasing chronic white matter ischemic change is noted. Mild atrophic changes are seen as well. Vascular: No hyperdense vessel or unexpected calcification. Skull: Normal. Negative for fracture or focal lesion. Sinuses/Orbits: No acute finding. Other: None. CT CERVICAL SPINE FINDINGS Alignment: Within normal limits. Skull base and vertebrae: Exam is somewhat limited by patient motion artifact. Seven cervical segments are well visualized. Vertebral body height is well maintained. Disc space narrowing is noted from C2-C6 with mild osteophytic changes. No acute fracture or acute facet abnormality is noted. The odontoid is within normal limits. Soft tissues and spinal canal: Surrounding soft tissue structures show no acute abnormality. Upper chest: Visualized lung apices are unremarkable. Other: None IMPRESSION: CT of the head: Chronic atrophic and ischemic changes without acute abnormality. CT of the cervical spine: Somewhat limited exam due to patient motion artifact. Multilevel degenerative change is seen without acute abnormality. Electronically Signed   By: Inez Catalina M.D.   On: 03/08/2019 21:39   CT ANGIO CHEST PE W OR  WO CONTRAST  Result Date: 03/31/2019 CLINICAL DATA:  Elevated D-dimer. EXAM: CT ANGIOGRAPHY CHEST WITH CONTRAST TECHNIQUE: Multidetector CT imaging of the chest  was performed using the standard protocol during bolus administration of intravenous contrast. Multiplanar CT image reconstructions and MIPs were obtained to evaluate the vascular anatomy. CONTRAST:  71mL OMNIPAQUE IOHEXOL 350 MG/ML SOLN COMPARISON:  None. FINDINGS: Cardiovascular: Satisfactory opacification of the pulmonary arteries to the segmental level. Small pulmonary embolus in the right lower lobe segmental pulmonary artery cardiomegaly. Thoracic aortic atherosclerosis. No pericardial effusion. Mediastinum/Nodes: No enlarged mediastinal, hilar, or axillary lymph nodes. Thyroid gland, trachea, and esophagus demonstrate no significant findings. Lungs/Pleura: Small bilateral pleural effusions. Left basilar atelectasis. Mild interstitial prominence. No focal consolidation. No pneumothorax. Upper Abdomen: No acute abnormality. Musculoskeletal: No acute osseous abnormality. No aggressive osseous lesion. Severe dextroscoliosis of the thoracolumbar spine. No aggressive osseous lesion. Severe osteoarthritis of the right glenohumeral joint. Severe osteoarthritis of the sternoclavicular joints. Other: Diffuse anasarca. Review of the MIP images confirms the above findings. IMPRESSION: 1. Small pulmonary embolus in the right lower lobe segmental pulmonary artery cardiomegaly. RV/LV ratio 1.3 which can be seen with right heart strain, but there is likely underlying right heart failure in this patient with low PE burden. 2. Findings concerning for mild CHF.  Diffuse anasarca. 3. Aortic Atherosclerosis (ICD10-I70.0). Critical Value/emergent results were called by telephone at the time of interpretation on 03/31/2019 at 4:29 am to provider [facility] , who verbally acknowledged these results. Electronically Signed   By: Kathreen Devoid   On: 03/31/2019 04:46   CT CERVICAL SPINE WO CONTRAST  Result Date: 03/08/2019 CLINICAL DATA:  Recent falls with headaches and neck pain, initial encounter EXAM: CT HEAD WITHOUT CONTRAST  CT CERVICAL SPINE WITHOUT CONTRAST TECHNIQUE: Multidetector CT imaging of the head and cervical spine was performed following the standard protocol without intravenous contrast. Multiplanar CT image reconstructions of the cervical spine were also generated. COMPARISON:  07/19/2009 FINDINGS: CT HEAD FINDINGS Brain: Heavy calcification of the basal ganglia is seen bilaterally and stable. Increasing chronic white matter ischemic change is noted. Mild atrophic changes are seen as well. Vascular: No hyperdense vessel or unexpected calcification. Skull: Normal. Negative for fracture or focal lesion. Sinuses/Orbits: No acute finding. Other: None. CT CERVICAL SPINE FINDINGS Alignment: Within normal limits. Skull base and vertebrae: Exam is somewhat limited by patient motion artifact. Seven cervical segments are well visualized. Vertebral body height is well maintained. Disc space narrowing is noted from C2-C6 with mild osteophytic changes. No acute fracture or acute facet abnormality is noted. The odontoid is within normal limits. Soft tissues and spinal canal: Surrounding soft tissue structures show no acute abnormality. Upper chest: Visualized lung apices are unremarkable. Other: None IMPRESSION: CT of the head: Chronic atrophic and ischemic changes without acute abnormality. CT of the cervical spine: Somewhat limited exam due to patient motion artifact. Multilevel degenerative change is seen without acute abnormality. Electronically Signed   By: Inez Catalina M.D.   On: 03/08/2019 21:39   DG Chest Port 1 View  Result Date: 03/30/2019 CLINICAL DATA:  Abdominal pain. EXAM: PORTABLE CHEST 1 VIEW COMPARISON:  March 14, 2019 FINDINGS: Moderate to marked severity left basilar atelectasis and/or infiltrate is seen. This is stable in severity when compared to the prior study. A 6 mm calcified nodular opacity is seen overlying the mid right lung. A small left pleural effusion is noted which is mildly increased in size. No  pneumothorax is identified. The cardiac silhouette is mildly enlarged  and unchanged in size. Radiopaque surgical clips are seen along the lateral aspect of the mid left hemithorax. Degenerative changes seen throughout the thoracic spine with stable moderate to marked severity dextroscoliosis. IMPRESSION: 1. Moderate to marked severity left basilar atelectasis and/or infiltrate. This is stable in severity when compared to the prior study. 2. Small left pleural effusion which is mildly increased in size. Electronically Signed   By: Virgina Norfolk M.D.   On: 03/30/2019 18:40   DG CHEST PORT 1 VIEW  Result Date: 03/14/2019 CLINICAL DATA:  Respiratory failure, pneumonia EXAM: PORTABLE CHEST 1 VIEW COMPARISON:  03/11/2019 FINDINGS: Cardiomegaly. Vascular congestion and bilateral interstitial and airspace opacities, worsening since prior study. Suspect small effusions. No acute bony abnormality. IMPRESSION: Worsening bilateral interstitial and airspace opacities, edema versus infection. Small bilateral effusions. Electronically Signed   By: Rolm Baptise M.D.   On: 03/14/2019 08:24   DG CHEST PORT 1 VIEW  Result Date: 03/11/2019 CLINICAL DATA:  86 year old female with history of shortness of breath. EXAM: PORTABLE CHEST 1 VIEW COMPARISON:  Chest x-ray 03/08/2019. FINDINGS: There is cephalization of the pulmonary vasculature and slight indistinctness of the interstitial markings suggestive of mild pulmonary edema. Trace right pleural effusion. Mild cardiomegaly. The patient is rotated to the left on today's exam, resulting in distortion of the mediastinal contours and reduced diagnostic sensitivity and specificity for mediastinal pathology. Aortic atherosclerosis. Surgical clips projecting over the left axillary region, likely from prior lymph node dissection. IMPRESSION: 1. The appearance of the chest is most compatible with mild congestive heart failure, as above. 2. Aortic atherosclerosis. Electronically Signed    By: Vinnie Langton M.D.   On: 03/11/2019 04:50   DG Chest Port 1 View  Result Date: 03/08/2019 CLINICAL DATA:  Fall, fever EXAM: PORTABLE CHEST 1 VIEW COMPARISON:  Radiograph August 30, 2013 FINDINGS: Diffuse hazy interstitial opacities throughout both lungs with more patchy opacity in the right lung base and a small right pleural effusion. No pneumothorax. Cardiac silhouette is borderline enlarged. The aorta is calcified and tortuous. The osseous structures appear diffusely demineralized which may limit detection of small or nondisplaced fractures. Degenerative changes are present in the imaged spine and shoulders. Stable dextrocurvature of the thoracic spine. Postsurgical changes in the left axilla. IMPRESSION: Diffuse hazy interstitial opacities throughout both lungs with more patchy opacity in the right lung base and a small right pleural effusion. Findings may represent pulmonary edema and/or multifocal pneumonia. Electronically Signed   By: Lovena Le M.D.   On: 03/08/2019 18:19   ECHOCARDIOGRAM COMPLETE  Result Date: 03/09/2019    ECHOCARDIOGRAM REPORT   Patient Name:   LATANIA LEWERS Date of Exam: 03/09/2019 Medical Rec #:  [id number]        Height:       63.0 in Accession #:    [id number]       Weight:       89.9 lb Date of Birth:  Jul 13, 1918        BSA:          1.377 m Patient Age:    32 years         BP:           102/62 mmHg Patient Gender: F                HR:           85 bpm. Exam Location:  Inpatient Procedure: 2D Echo, Color Doppler and Cardiac Doppler Indications:    R06.9  DOE  History:        Patient has no prior history of Echocardiogram examinations.                 Risk Factors:Dyslipidemia. Mastectomy.  Sonographer:    Raquel Sarna Senior RDCS Referring Phys: [phone] [place]  1. Left ventricular ejection fraction, by estimation, is 60 to 65%. The left ventricle has normal function. The left ventricle has no regional wall motion abnormalities. Left ventricular diastolic  parameters are consistent with Grade I diastolic dysfunction (impaired relaxation).  2. Right ventricular systolic function is normal. The right ventricular size is normal. There is moderately elevated pulmonary artery systolic pressure.  3. Left atrial size was moderately dilated.  4. Right atrial size was moderately dilated.  5. The mitral valve is normal in structure and function. Trivial mitral valve regurgitation.  6. Tricuspid valve regurgitation is moderate.  7. The aortic valve is tricuspid. Aortic valve regurgitation is trivial. Moderate aortic valve stenosis. FINDINGS  Left Ventricle: Left ventricular ejection fraction, by estimation, is 60 to 65%. The left ventricle has normal function. The left ventricle has no regional wall motion abnormalities. The left ventricular internal cavity size was normal in size. There is  no left ventricular hypertrophy. Left ventricular diastolic parameters are consistent with Grade I diastolic dysfunction (impaired relaxation). Right Ventricle: The right ventricular size is normal. No increase in right ventricular wall thickness. Right ventricular systolic function is normal. There is moderately elevated pulmonary artery systolic pressure. The tricuspid regurgitant velocity is 3.26 m/s, and with an assumed right atrial pressure of 3 mmHg, the estimated right ventricular systolic pressure is [id number] mmHg. Left Atrium: Left atrial size was moderately dilated. Right Atrium: Right atrial size was moderately dilated. Pericardium: There is no evidence of pericardial effusion. Mitral Valve: The mitral valve is normal in structure and function. There is mild calcification of the mitral valve leaflet(s). Trivial mitral valve regurgitation. Tricuspid Valve: The tricuspid valve is normal in structure. Tricuspid valve regurgitation is moderate. Aortic Valve: The aortic valve is tricuspid. Aortic valve regurgitation is trivial. Moderate aortic stenosis is present. There is moderate  calcification of the aortic valve. Aortic valve mean gradient measures 15.0 mmHg. Aortic valve peak gradient measures 25.8 mmHg. Aortic valve area, by VTI measures 0.72 cm. Pulmonic Valve: The pulmonic valve was normal in structure. Pulmonic valve regurgitation is not visualized. Aorta: The aortic root and ascending aorta are structurally normal, with no evidence of dilitation. IAS/Shunts: No atrial level shunt detected by color flow Doppler.  LEFT VENTRICLE PLAX 2D LVIDd:         3.50 cm  Diastology LVIDs:         2.60 cm  LV e' lateral:   3.09 cm/s LV PW:         1.20 cm  LV E/e' lateral: 20.0 LV IVS:        1.00 cm  LV e' medial:    2.47 cm/s LVOT diam:     1.80 cm  LV E/e' medial:  25.0 LV SV:         35 LV SV Index:   25 LVOT Area:     2.54 cm  RIGHT VENTRICLE RV S prime:     8.83 cm/s TAPSE (M-mode): 1.4 cm LEFT ATRIUM             Index       RIGHT ATRIUM           Index LA diam:  3.40 cm 2.47 cm/m  RA Area:     18.50 cm LA Vol (A2C):   66.2 ml 48.08 ml/m RA Volume:   58.40 ml  42.42 ml/m LA Vol (A4C):   48.0 ml 34.86 ml/m LA Biplane Vol: 56.8 ml 41.25 ml/m  AORTIC VALVE AV Area (Vmax):    0.76 cm AV Area (Vmean):   0.72 cm AV Area (VTI):     0.72 cm AV Vmax:           254.00 cm/s AV Vmean:          185.000 cm/s AV VTI:            0.487 m AV Peak Grad:      25.8 mmHg AV Mean Grad:      15.0 mmHg LVOT Vmax:         75.70 cm/s LVOT Vmean:        52.700 cm/s LVOT VTI:          0.137 m LVOT/AV VTI ratio: 0.28  AORTA Ao Root diam: 2.30 cm Ao Asc diam:  2.30 cm MITRAL VALVE                TRICUSPID VALVE MV Area (PHT): 2.87 cm     TR Peak grad:   42.5 mmHg MV Decel Time: 264 msec     TR Vmax:        326.00 cm/s MV E velocity: 61.70 cm/s MV A velocity: 111.00 cm/s  SHUNTS MV E/A ratio:  0.56         Systemic VTI:  0.14 m                             Systemic Diam: 1.80 cm Glori Bickers MD Electronically signed by Glori Bickers MD Signature Date/Time: 03/09/2019/6:22:33 PM    Final    VAS [place]  LOWER EXTREMITY VENOUS (DVT)  Result Date: 03/11/2019  Lower Venous DVTStudy Indications: Elevated Ddimer.  Risk Factors: None identified. Limitations: Patient positioning, patient immobility, patient pain tolerance. Comparison Study: No prior studies. Performing Technologist: Oliver Hum RVT  Examination Guidelines: A complete evaluation includes B-mode imaging, spectral Doppler, color Doppler, and power Doppler as needed of all accessible portions of each vessel. Bilateral testing is considered an integral part of a complete examination. Limited examinations for reoccurring indications may be performed as noted. The reflux portion of the exam is performed with the patient in reverse Trendelenburg.  +---------+---------------+---------+-----------+----------+--------------+  RIGHT     Compressibility Phasicity Spontaneity Properties Thrombus Aging  +---------+---------------+---------+-----------+----------+--------------+  CFV       Full            Yes       Yes                                    +---------+---------------+---------+-----------+----------+--------------+  SFJ       Full                                                             +---------+---------------+---------+-----------+----------+--------------+  FV Prox   Full                                                             +---------+---------------+---------+-----------+----------+--------------+  FV Mid    Full                                                             +---------+---------------+---------+-----------+----------+--------------+  FV Distal Full                                                             +---------+---------------+---------+-----------+----------+--------------+  PFV       Full                                                             +---------+---------------+---------+-----------+----------+--------------+  POP       Full            Yes       Yes                                     +---------+---------------+---------+-----------+----------+--------------+  PTV       Full                                                             +---------+---------------+---------+-----------+----------+--------------+  PERO      Full                                                             +---------+---------------+---------+-----------+----------+--------------+   +---------+---------------+---------+-----------+----------+--------------+  LEFT      Compressibility Phasicity Spontaneity Properties Thrombus Aging  +---------+---------------+---------+-----------+----------+--------------+  CFV       Full            Yes       Yes                                    +---------+---------------+---------+-----------+----------+--------------+  SFJ       Full                                                             +---------+---------------+---------+-----------+----------+--------------+  FV Prox   Full                                                             +---------+---------------+---------+-----------+----------+--------------+  FV Mid    Full                                                             +---------+---------------+---------+-----------+----------+--------------+  FV Distal Full                                                             +---------+---------------+---------+-----------+----------+--------------+  PFV       Full                                                             +---------+---------------+---------+-----------+----------+--------------+  POP       Full            Yes       Yes                                    +---------+---------------+---------+-----------+----------+--------------+  PTV       Full                                                             +---------+---------------+---------+-----------+----------+--------------+  PERO      Full                                                              +---------+---------------+---------+-----------+----------+--------------+     Summary: RIGHT: - There is no evidence of deep vein thrombosis in the lower extremity.  - No cystic structure found in the popliteal fossa.  LEFT: - There is no evidence of deep vein thrombosis in the lower extremity.  - No cystic structure found in the popliteal fossa.  *See table(s) above for measurements and observations. Electronically signed by Monica Martinez MD on 03/11/2019 at 11:06:42 AM.    Final    ECHOCARDIOGRAM LIMITED  Result Date: 04/01/2019    ECHOCARDIOGRAM LIMITED REPORT   Patient Name:   MARGEL TALAMANTES Date of Exam: 04/01/2019 Medical Rec #:  [id number]        Height:       63.0 in Accession #:    [id number]       Weight:       89.9 lb Date of Birth:  [birth year]        BSA:          1.377 m Patient Age:    67 years         BP:  174/74 mmHg Patient Gender: F                HR:           70 bpm. Exam Location:  Inpatient Procedure: Limited Echo Indications:    415.19 pulmonary embolus  History:        Patient has prior history of Echocardiogram examinations, most                 recent 03/09/2019. Risk Factors:Former Smoker.  Sonographer:    Jannett Celestine RDCS (AE) Referring Phys: [id number] Darcel Frane K Kaytee Taliercio  Sonographer Comments: No parasternal window and suboptimal apical window. Image acquisition challenging due to mastectomy. restricted mobility (patient unable to straighten knees) IMPRESSIONS  1. Left ventricular ejection fraction, by estimation, is 55 to 60%. The left ventricle has normal function. There is mild concentric left ventricular hypertrophy.  2. Right ventricular systolic function is mildly reduced. The right ventricular size is moderately enlarged. Mildly increased right ventricular wall thickness. There is moderately elevated pulmonary artery systolic pressure. The estimated right ventricular systolic pressure is [id number] mmHg.  3. Left atrial size was severely dilated.  4. Right atrial size was  severely dilated.  5. The mitral valve is degenerative. Mild to moderate mitral valve regurgitation.  6. Tricuspid valve regurgitation is moderate to severe.  7. Aortic valve is heavily calcified with restricted movement. Doppler interrogation is not optimal. Suspect mild to moderate aortic stenosis is present. The aortic valve is tricuspid. Aortic valve regurgitation is not visualized.  8. The inferior vena cava is normal in size with <50% respiratory variability, suggesting right atrial pressure of 8 mmHg. Comparison(s): A prior study was performed on 03/09/2019. Changes from prior study are noted. RV appears moderately dilated on current study. LVEF remains unchanged. FINDINGS  Left Ventricle: Left ventricular ejection fraction, by estimation, is 55 to 60%. The left ventricle has normal function. The left ventricular internal cavity size was normal in size. There is mild concentric left ventricular hypertrophy. Right Ventricle: The right ventricular size is moderately enlarged. Mildly increased right ventricular wall thickness. Right ventricular systolic function is mildly reduced. There is moderately elevated pulmonary artery systolic pressure. The tricuspid regurgitant velocity is 3.19 m/s, and with an assumed right atrial pressure of 8 mmHg, the estimated right ventricular systolic pressure is [id number] mmHg. Left Atrium: Left atrial size was severely dilated. Right Atrium: Right atrial size was severely dilated. Pericardium: A small pericardial effusion is present. The pericardial effusion is circumferential. Presence of pericardial fat pad. Mitral Valve: The mitral valve is degenerative in appearance. Mild to moderate mitral valve regurgitation. Tricuspid Valve: Tricuspid valve regurgitation is moderate to severe. Aortic Valve: Aortic valve is heavily calcified with restricted movement. Doppler interrogation is not optimal. Suspect mild to moderate aortic stenosis is present. The aortic valve is tricuspid. Aortic  valve regurgitation is not visualized. Aortic valve  mean gradient measures 7.0 mmHg. Aortic valve peak gradient measures 14.3 mmHg. Venous: The inferior vena cava is normal in size with less than 50% respiratory variability, suggesting right atrial pressure of 8 mmHg. Additional Comments: There is a small pleural effusion in the left lateral region.  AORTIC VALVE AV Vmax:           189.00 cm/s AV Vmean:          119.000 cm/s AV VTI:            0.423 m AV Peak Grad:      14.3 mmHg AV Mean Grad:  7.0 mmHg LVOT Vmax:         57.40 cm/s LVOT Vmean:        41.600 cm/s LVOT VTI:          0.150 m LVOT/AV VTI ratio: 0.35 TRICUSPID VALVE TR Peak grad:   40.7 mmHg TR Vmax:        319.00 cm/s  SHUNTS Systemic VTI: 0.15 m Eleonore Chiquito MD Electronically signed by Eleonore Chiquito MD Signature Date/Time: 04/01/2019/1:15:41 PM    Final     Lab Data:  CBC: Recent Labs  Lab 03/30/19 1730 03/31/19 0555 04/01/19 0826 04/02/19 0422  WBC 2.9* 2.0* 2.0* 1.8*  NEUTROABS 1.7  --   --   --   HGB 10.0* 8.6* 11.2* 12.5  HCT 34.7* 30.1* 37.8 42.9  MCV 93.5 93.8 90.6 92.3  PLT 674* 452* 678* [id number]*   Basic Metabolic Panel: Recent Labs  Lab 03/30/19 1730 03/31/19 0555 04/01/19 0011 04/01/19 0826 04/02/19 0422  NA 148* 144  --  145 146*  K 3.7 3.8  --  3.2* 4.0  CL 113* 115*  --  109 108  CO2 24 22  --  24 23  GLUCOSE 120* 102*  --  72 75  BUN 27* 23  --  29* 31*  CREATININE 1.36* 1.03* 1.17* 1.11* 1.05*  CALCIUM 7.9* 6.8*  --  7.7* 8.0*   GFR: Estimated Creatinine Clearance: 18.8 mL/min (A) (by C-G formula based on SCr of 1.05 mg/dL (H)). Liver Function Tests: Recent Labs  Lab 03/30/19 1730 04/01/19 0826  AST 32 53*  ALT 18 25  ALKPHOS 66 156*  BILITOT 0.4 0.5  PROT 6.6 6.2*  ALBUMIN 2.6* 2.2*   No results for input(s): LIPASE, AMYLASE in the last 168 hours. No results for input(s): AMMONIA in the last 168 hours. Coagulation Profile: Recent Labs  Lab 03/30/19 1730  INR 1.2   Cardiac  Enzymes: No results for input(s): CKTOTAL, CKMB, CKMBINDEX, TROPONINI in the last 168 hours. BNP (last 3 results) No results for input(s): PROBNP in the last 8760 hours. HbA1C: No results for input(s): HGBA1C in the last 72 hours. CBG: No results for input(s): GLUCAP in the last 168 hours. Lipid Profile: No results for input(s): CHOL, HDL, LDLCALC, TRIG, CHOLHDL, LDLDIRECT in the last 72 hours. Thyroid Function Tests: Recent Labs    03/30/19 1730  TSH 12.915*   Anemia Panel: Recent Labs    04/01/19 1905 04/02/19 0422  FERRITIN 418* 396*   Urine analysis:    Component Value Date/Time   COLORURINE YELLOW 03/30/2019 1801   APPEARANCEUR CLEAR 03/30/2019 1801   LABSPEC 1.017 03/30/2019 1801   PHURINE 5.0 03/30/2019 1801   GLUCOSEU NEGATIVE 03/30/2019 1801   GLUCOSEU NEGATIVE 01/19/2017 1346   HGBUR SMALL (A) 03/30/2019 1801   BILIRUBINUR NEGATIVE 03/30/2019 1801   BILIRUBINUR neg 03/09/2013 1447   KETONESUR NEGATIVE 03/30/2019 1801   PROTEINUR 100 (A) 03/30/2019 1801   UROBILINOGEN 0.2 01/19/2017 1346   NITRITE NEGATIVE 03/30/2019 1801   LEUKOCYTESUR NEGATIVE 03/30/2019 1801     Ana Woodroof M.D. [facility] 04/02/2019, 12:37 PM   Call night coverage person covering after 7pm

## 2019-04-02 NOTE — Plan of Care (Signed)
  Problem: Clinical Measurements: Goal: Diagnostic test results will improve Outcome: Progressing   Problem: Skin Integrity: Goal: Risk for impaired skin integrity will decrease Outcome: Progressing

## 2019-04-02 NOTE — Telephone Encounter (Signed)
Done

## 2019-04-02 NOTE — Progress Notes (Signed)
Angela [name] for IV heparin  Indication: pulmonary embolus  Allergies  Allergen Reactions  . Penicillins     REACTION: Question of penicillin allergy Did it involve swelling of the face/tongue/throat, SOB, or low BP? Unknown Did it involve sudden or severe rash/hives, skin peeling, or any reaction on the inside of your mouth or nose? Unknown Did you need to seek medical attention at a hospital or doctor's office? Unknown When did it last happen?Unknown If all above answers are "NO", may proceed with cephalosporin use.    Patient Measurements: Height: 5\' 3"  (160 cm) Weight: 89 lb 15.2 oz (40.8 kg) IBW/kg (Calculated) : 52.4  Vital Signs: Temp: 96.2 F (35.7 C) (03/29 0445) Temp Source: Rectal (03/29 0445) BP: 144/82 (03/29 0445) Pulse Rate: 74 (03/29 0445)  Labs: Recent Labs    03/30/19 1730 03/30/19 1730 03/31/19 0555 03/31/19 1400 04/01/19 0011 04/01/19 0011 04/01/19 0826 04/01/19 1905 04/02/19 0422  HGB 10.0*   < > 8.6*  --   --   --  11.2*  --  12.5  HCT 34.7*   < > 30.1*  --   --   --  37.8  --  42.9  PLT 674*   < > 452*  --   --   --  678*  --  745*  APTT 43*  --   --   --   --   --   --   --   --   LABPROT 15.5*  --   --   --   --   --   --   --   --   INR 1.2  --   --   --   --   --   --   --   --   HEPARINUNFRC  --   --   --    < > 0.55   < > 0.79* 0.52 0.44  CREATININE 1.36*   < > 1.03*  --  1.17*  --  1.11*  --  1.05*   < > = values in this interval not displayed.   Estimated Creatinine Clearance: 18.8 mL/min (A) (by C-G formula based on SCr of 1.05 mg/dL (H)).  Medical History: Past Medical History:  Diagnosis Date  . Adenocarcinoma of breast ([place])   . Allergic rhinitis   . Anemia   . Anxiety   . Depression   . Rheumatoid arthritis(714.0)    Medications:  Scheduled:  . vitamin C  500 mg Oral Daily  . dexamethasone (DECADRON) injection  6 mg Intravenous Q24H  . feeding supplement (ENSURE ENLIVE)  237  mL Oral BID BM  . furosemide  40 mg Intravenous Q12H  . levothyroxine  25 mcg Oral Q0600  . multivitamin with minerals  1 tablet Oral Daily  . zinc sulfate  220 mg Oral Daily   Infusions:  . ceFEPime (MAXIPIME) IV 2 g (04/01/19 1835)  . heparin 600 Units/hr (04/01/19 1840)  . remdesivir 100 mg in NS 100 mL 100 mg (04/01/19 1313)   Assessment: 86 yo female with COVID PNA, ddimer > 5, now found to have a PE per CT scan to start IV heparin per pharmacy dosing.   Goal of Therapy:  Heparin level 0.3-0.7 units/ml Monitor platelets by anticoagulation protocol: Yes   Today, 04/02/2019  HL = 0.44  CBC stable  No bleeding reported   Plan:   Continue Heparin at 600 units/hr  Difficult stick  Daily CBC, daily  Hep level  Ulice Dash D PharmD 04/02/2019,11:58 AM

## 2019-04-02 NOTE — Progress Notes (Addendum)
Initial Nutrition Assessment  RD working remotely.   DOCUMENTATION CODES:   Underweight  INTERVENTION:  - will order Ensure Enlive BID, each supplement provides 350 kcal and 20 grams of protein. - will order Magic Cup BID with meals, each supplement provides 290 kcal and 9 grams of protein. - will order daily multivitamin with minerals.  - continue to encourage intakes and floor staff to assist with feeding, if needed.   NUTRITION DIAGNOSIS:   Increased nutrient needs related to wound healing, acute illness, catabolic [id number] infection) as evidenced by estimated needs  GOAL:   Patient will meet greater than or equal to 90% of their needs  MONITOR:   PO intake, Supplement acceptance, Labs, Weight trends, Skin  REASON FOR ASSESSMENT:   Malnutrition Screening Tool  ASSESSMENT:   86 year old female with history of breast cancer s/p mastectomy, anemia, anxiety, rheumatoid arthritis, hypothyroidism, hospitalization 3/4-3/10 for multifocal PNA, hyponatremia, AKI, acute CHF, AVNRT. She presented to ED with abdominal pain, fevers up to103.1 F, hypotensive BP 88/60, tachycardia 130, tachypnea with respiratory rate of 40. CXR showed moderate left basilar atelectasis and/or infiltrates. Patient was placed on vancomycin, cefepime, metronidazole.  Patient is noted to be a/o to self and place. She consumed 25% of breakfast, 25% of lunch, and 20% of dinner yesterday. She was seen by Palliative Care yesterday who also spoke with two of her children. Plan to continue aggressive care at this time.   Per chart review, weight on 3/26 was 90 lb which is the same as weight recorded on 3/4. No other weight hx available in the chart. Moderate edema to BLE.   Per notes: - acute hypoxic respiratory failure d/t COVID-19 PNA - acute PE - sepsis 2/2 HCAP   Labs reviewed; Na: 146 mmol/l, creatinine: 1.05 mg/dl, Ca: 8 mg/dl, GFR: 44 ml/min. Medications reviewed; 500 mg ascorbic acid/day, 40 mg  IV lasix BID, 25 mcg oral synthroid/day, 40 mEq Klor-Con x1 dose 3/28, 100 mg IV remdesivir x1 dose/day (3/27-3/30), 220 mg zinc sulfate/day.      NUTRITION - FOCUSED PHYSICAL EXAM:  unable to complete at this time.   Diet Order:   Diet Order            Diet Heart Room service appropriate? Yes; Fluid consistency: Thin; Fluid restriction: 1200 mL Fluid  Diet effective now              EDUCATION NEEDS:   No education needs have been identified at this time  Skin:  Skin Assessment: Skin Integrity Issues: Skin Integrity Issues:: Stage II Stage II: L knee; R hip; sacrum  Last BM:  3/28  Height:   Ht Readings from Last 1 Encounters:  03/30/19 5\' 3"  (1.6 m)    Weight:   Wt Readings from Last 1 Encounters:  03/30/19 40.8 kg    Ideal Body Weight:  52.3 kg  BMI:  Body mass index is 15.93 kg/m.  Estimated Nutritional Needs:   Kcal:  1400-1650 kcal  Protein:  70-85 grams  Fluid:  >/= 1.6 L/day     Jarome Matin, MS, RD, LDN, CNSC Inpatient Clinical Dietitian RD pager # available in [facility]  After hours/weekend pager # available in [facility]

## 2019-04-03 ENCOUNTER — Inpatient Hospital Stay (HOSPITAL_COMMUNITY): Payer: Medicare Other

## 2019-04-03 LAB — BASIC METABOLIC PANEL
Anion gap: 12 (ref 5–15)
BUN: 40 mg/dL — ABNORMAL HIGH (ref 8–23)
CO2: 27 mmol/L (ref 22–32)
Calcium: 7.9 mg/dL — ABNORMAL LOW (ref 8.9–10.3)
Chloride: 107 mmol/L (ref 98–111)
Creatinine, Ser: 1.2 mg/dL — ABNORMAL HIGH (ref 0.44–1.00)
GFR calc Af Amer: 43 mL/min — ABNORMAL LOW (ref >60)
GFR calc non Af Amer: 37 mL/min — ABNORMAL LOW (ref >60)
Glucose, Bld: 118 mg/dL — ABNORMAL HIGH (ref 70–99)
Potassium: 3.7 mmol/L (ref 3.5–5.1)
Sodium: 146 mmol/L — ABNORMAL HIGH (ref 135–145)

## 2019-04-03 LAB — DIFFERENTIAL
Abs Immature Granulocytes: 0.04 10*3/uL (ref 0.00–0.07)
Basophils Absolute: 0 10*3/uL (ref 0.0–0.1)
Basophils Relative: 1 %
Eosinophils Absolute: 0 10*3/uL (ref 0.0–0.5)
Eosinophils Relative: 0 %
Immature Granulocytes: 2 %
Lymphocytes Relative: 15 %
Lymphs Abs: 0.3 10*3/uL — ABNORMAL LOW (ref 0.7–4.0)
Monocytes Absolute: 0.2 10*3/uL (ref 0.1–1.0)
Monocytes Relative: 9 %
Neutro Abs: 1.4 10*3/uL — ABNORMAL LOW (ref 1.7–7.7)
Neutrophils Relative %: 73 %

## 2019-04-03 LAB — CBC
HCT: 33.4 % — ABNORMAL LOW (ref 36.0–46.0)
Hemoglobin: 9.9 g/dL — ABNORMAL LOW (ref 12.0–15.0)
MCH: 26.8 pg (ref 26.0–34.0)
MCHC: 29.6 g/dL — ABNORMAL LOW (ref 30.0–36.0)
MCV: 90.3 fL (ref 80.0–100.0)
Platelets: 787 10*3/uL — ABNORMAL HIGH (ref 150–400)
RBC: 3.7 MIL/uL — ABNORMAL LOW (ref 3.87–5.11)
RDW: 22 % — ABNORMAL HIGH (ref 11.5–15.5)
WBC: 1.9 10*3/uL — ABNORMAL LOW (ref 4.0–10.5)
nRBC: 0 % (ref 0.0–0.2)

## 2019-04-03 LAB — CORTISOL: Cortisol, Plasma: 7.6 ug/dL

## 2019-04-03 LAB — C-REACTIVE PROTEIN: CRP: 7.1 mg/dL — ABNORMAL HIGH (ref <1.0)

## 2019-04-03 LAB — GLUCOSE, CAPILLARY: Glucose-Capillary: 133 mg/dL — ABNORMAL HIGH (ref 70–99)

## 2019-04-03 LAB — T4, FREE: Free T4: 0.84 ng/dL (ref 0.61–1.12)

## 2019-04-03 LAB — PROCALCITONIN: Procalcitonin: 0.66 ng/mL

## 2019-04-03 LAB — HEPARIN LEVEL (UNFRACTIONATED): Heparin Unfractionated: 0.61 IU/mL (ref 0.30–0.70)

## 2019-04-03 LAB — D-DIMER, QUANTITATIVE: D-Dimer, Quant: 2.5 ug/mL-FEU — ABNORMAL HIGH (ref 0.00–0.50)

## 2019-04-03 LAB — FERRITIN: Ferritin: 414 ng/mL — ABNORMAL HIGH (ref 11–307)

## 2019-04-03 MED ORDER — FUROSEMIDE 10 MG/ML IJ SOLN: 40.0000 mg | Freq: Every day | INTRAMUSCULAR | Status: DC

## 2019-04-03 MED ORDER — CITALOPRAM HYDROBROMIDE 20 MG PO TABS
10.0000 mg | ORAL_TABLET | Freq: Every day | ORAL | Status: DC
  Administered 2019-04-03 – 2019-04-14 (×6): 10 mg via ORAL
  Filled 2019-04-03 (×10): qty 1

## 2019-04-03 MED ORDER — METOPROLOL TARTRATE 5 MG/5ML IV SOLN
5.0000 mg | Freq: Four times a day (QID) | INTRAVENOUS | Status: DC | PRN
  Administered 2019-04-03: 5 mg via INTRAVENOUS
  Filled 2019-04-03: qty 5

## 2019-04-03 MED ORDER — SODIUM CHLORIDE 0.9 % IV BOLUS
500.0000 mL | Freq: Once | INTRAVENOUS | Status: AC
  Administered 2019-04-03: 500 mL via INTRAVENOUS

## 2019-04-03 MED ORDER — METOPROLOL TARTRATE 25 MG PO TABS
25.0000 mg | ORAL_TABLET | Freq: Two times a day (BID) | ORAL | Status: DC
  Administered 2019-04-03: 25 mg via ORAL
  Filled 2019-04-03: qty 1

## 2019-04-03 NOTE — Progress Notes (Signed)
[facility]                                                                              Patient Demographics  Angela [name], is a 86 y.o. female, DOB - 1918-06-04, [id number]  Admit date - 03/30/2019   Admitting Physician Shela Leff, MD  Outpatient Primary MD for the patient is [name], MD  Outpatient specialists:   LOS - 4  days   Medical records reviewed and are as summarized below:    Chief Complaint  Patient presents with  . Code Sepsis  . Wound Check       Brief summary   Patient is a 86 year old female with history of breast CA status post mastectomy, anemia, anxiety, rheumatoid arthritis, hypothyroidism, recent hospital admission 3/4-3/10/2019 (secondary to multifocal pneumonia, hyponatremia, AKI, acute diastolic CHF, AVNRT) presented to ED with abdominal pain, fevers, 103.1 F, hypotensive BP 88/60, tachycardia 130, tachypnea with respiratory rate of 40.  O2 sats 98% on room air Patient was also found to have wound in the buttocks, sacral region Chest x-ray showed moderate to marked severity left basilar atelectasis and/or infiltrates.  Patient was placed on vancomycin, cefepime, metronidazole. COVID-19 positive  Assessment & Plan    Principal Problem:  Acute hypoxic respiratory failure due to acute COVID-19 viral pneumonia during the ongoing COVID-19 pandemic- POA, acute PE, sepsis secondary to HCAP, hypothermia - Patient presented with SIRS, hypotension, tachycardia, tachypnea, COVID-19 positive, chest x-ray showed moderate to marked severity left basilar atelectasis/infiltrates  -Continue IV Decadron, remdesivir day #4 today  -Continue IV cefepime, vancomycin has been discontinued.  Leukopenia likely due to COVID-19.   -Inflammatory markers trending down, however has borderline hypothermia, soft BP, tachycardia. Ordered stat chest x-ray, procalcitonin, blood cultures, random cortisol level (will be skewed due to Decadron) -CT  angiogram chest showed small pulmonary embolus in the right lower lobe in the segmental pulmonary artery, right heart strain.  Continue IV heparin drip - Continue Supportive care: vitamin C/zinc, albuterol, Tylenol. - Continue to wean oxygen, ambulatory O2 screening daily as tolerated  - Oxygen - SpO2: 94 % O2 Flow Rate (L/min): 2 L/min - Continue to follow labs as below  Lab Results  Component Value Date   SARSCOV2NAA POSITIVE (A) 03/30/2019   [place] NEGATIVE 03/14/2019   [place] NEGATIVE 03/08/2019     Recent Labs  Lab 03/30/19 1730 03/30/19 1801 04/01/19 0826 04/01/19 1905 04/02/19 0422 04/03/19 0459  DDIMER  --  5.31* 2.84*  --  2.72* 2.50*  FERRITIN  --  172  --  418* 396* 414*  CRP  --  10.5*  --  9.3* 9.3* 7.1*  ALT 18  --  25  --   --   --   PROCALCITON 0.23  --   --   --  0.61  --       Active Problems:  Acute pulmonary embolism -CT angiogram of the chest showed acute right lower lobe PE, with possible right heart strain. -Continue IV heparin drip for now - Unable to obtain venous Dopplers due to contractures, will not change the management -2D echo showed EF of 55 to  60%, mildly reduced right ventricular systolic function, moderately elevated PA pressure    Acute diastolic CHF (congestive heart failure) ([place]) -2D echo 03/09/2019 showed normal LV function grade 1 diastolic function however appears to be volume overloaded with significant bilateral lower extremity edema, JVD. -CT angiogram of the chest shows PE with possible right heart strain -Euvolemic now, much improved from 3+ pitting edema at the time of admission -Given borderline hypotensive and tachycardia, hold further Lasix.  Gave 500 cc NS bolus.    AKI (acute kidney injury) ([place]) -Possibly due to acute CHF, hypotension and decreased renal perfusion, COVID-19 pneumonia -Creatinine trending up 1.2, hold Lasix for now  Hypernatremia Sodium 148 at the time of admission, currently  146  Hypothyroidism TSH 12.9, continue Synthroid, will check free T4, T3 -Will need thyroid studies in 4 to 6 weeks  Pressure Injury Documentation: Right hip lateral stage II Mid sacrum stage II, medial aspect of the left knee -Wound care per nursing, wound care consulted, appreciate recommendations  Severe protein calorie malnutrition, likely has underlying dementia Estimated body mass index is 15.93 kg/m as calculated from the following:   Height as of this encounter: 5\' 3"  (1.6 m).   Weight as of this encounter: 40.8 kg.   Goals of care Advanced age with COVID-19 pneumonia, acute respiratory failure with multiple other comorbidities, palliative medicine following  Code Status: DNR DVT Prophylaxis: Heparin drip Family Communication: Discussed all imaging results, lab results, explained to the patient's daughter on phone.    Disposition Plan: Patient from home.  Barriers to discharge, currently sepsis, pneumonia with acute CHF, COVID-19 respiratory failure   Time Spent in minutes 25 minutes  Procedures:  CT angiogram chest 2D echo  Consultants:   Palliative medicine  Antimicrobials:   Anti-infectives (From admission, onward)   Start     Dose/Rate Route Frequency Ordered Stop   03/31/19 1800  ceFEPIme (MAXIPIME) 2 g in sodium chloride 0.9 % 100 mL IVPB     2 g 200 mL/hr over 30 Minutes Intravenous Every 24 hours 03/30/19 2117     03/31/19 1000  remdesivir 100 mg in sodium chloride 0.9 % 100 mL IVPB     100 mg 200 mL/hr over 30 Minutes Intravenous Daily 03/31/19 0002 04/03/19 1114   03/31/19 0100  remdesivir 100 mg in sodium chloride 0.9 % 100 mL IVPB     100 mg 200 mL/hr over 30 Minutes Intravenous Every 1 hr x 2 03/31/19 0002 03/31/19 0152   03/30/19 2113  vancomycin variable dose per unstable renal function (pharmacist dosing)  Status:  Discontinued      Does not apply See admin instructions 03/30/19 2117 04/01/19 2333   03/30/19 1800  ceFEPIme (MAXIPIME) 2 g in  sodium chloride 0.9 % 100 mL IVPB     2 g 200 mL/hr over 30 Minutes Intravenous  Once 03/30/19 1751 03/30/19 1845   03/30/19 1800  metroNIDAZOLE (FLAGYL) IVPB 500 mg     500 mg 100 mL/hr over 60 Minutes Intravenous  Once 03/30/19 1751 03/30/19 1945   03/30/19 1800  vancomycin (VANCOCIN) IVPB 1000 mg/200 mL premix     1,000 mg 200 mL/hr over 60 Minutes Intravenous  Once 03/30/19 1751 03/30/19 2044         Medications  Scheduled Meds: . vitamin C  500 mg Oral Daily  . citalopram  10 mg Oral Daily  . dexamethasone (DECADRON) injection  6 mg Intravenous Q24H  . feeding supplement (ENSURE ENLIVE)  237 mL Oral  BID BM  . levothyroxine  25 mcg Oral Q0600  . metoprolol tartrate  25 mg Oral BID  . multivitamin with minerals  1 tablet Oral Daily  . zinc sulfate  220 mg Oral Daily   Continuous Infusions: . ceFEPime (MAXIPIME) IV Stopped (04/02/19 1921)  . heparin 600 Units/hr (04/03/19 0623)   PRN Meds:.acetaminophen **OR** acetaminophen, benzonatate, fluticasone, metoprolol tartrate, ondansetron (ZOFRAN) IV      Subjective:   Angela [name] was seen and examined today.  Temp low, patient does not like to keep the blankets on (per family, she does the same at home).  Peripheral edema much improved today.  However now tachycardiac and borderline BP.  Difficult to obtain review of system from the patient.   Objective:   Vitals:   04/03/19 1305 04/03/19 1320 04/03/19 1350 04/03/19 1420  BP: 98/64 100/68 115/89 117/63  Pulse: (!) 144 (!) 143 (!) 140 (!) 141  Resp: 18 (!) 25 (!) 22 (!) 25  Temp: 98 F (36.7 C) 97.6 F (36.4 C) (!) 97 F (36.1 C) (!) 97.1 F (36.2 C)  TempSrc: Rectal Rectal Rectal Rectal  SpO2: 100% 100% 99% 94%  Weight:      Height:        Intake/Output Summary (Last 24 hours) at 04/03/2019 1437 Last data filed at 04/03/2019 0909 Gross per 24 hour  Intake 450.41 ml  Output 800 ml  Net -349.59 ml     Wt Readings from Last 3 Encounters:  03/30/19 40.8  kg  03/08/19 40.8 kg   Physical Exam  General: Alert and awake, NAD  Cardiovascular: S1-S2 clear, tachycardia  Respiratory: Diminished breath sound throughout  Gastrointestinal: Soft, nontender, nondistended, NBS  Ext: 1+ pitting edema bilaterally  Neuro: Contractures in the upper and lower extremities, difficult to assess  Musculoskeletal: No cyanosis, clubbing  Skin: Sacral, right hip, medial aspect of the left knee wounds  Psych: Flat affect    Right hip wound      Medial aspect Left knee     Sacral wound      Data Reviewed:  I have personally reviewed following labs and imaging studies  Micro Results Recent Results (from the past 240 hour(s))  Culture, blood (Routine x 2)     Status: None (Preliminary result)   Collection Time: 03/30/19  5:30 PM   Specimen: BLOOD  Result Value Ref Range Status   Specimen Description   Final    BLOOD RIGHT ANTECUBITAL Performed at [facility], [place] [address] [place], [place] [address]    Special Requests   Final    BOTTLES DRAWN AEROBIC AND ANAEROBIC Blood Culture results may not be optimal due to an excessive volume of blood received in culture bottles Performed at [place] [address] [place], [place] [address]    Culture   Final    NO GROWTH 4 DAYS Performed at [facility], [place] [address] [place], [place] [address]    Report Status PENDING  Incomplete  Culture, blood (Routine x 2)     Status: None (Preliminary result)   Collection Time: 03/30/19  5:40 PM   Specimen: BLOOD  Result Value Ref Range Status   Specimen Description   Final    BLOOD LEFT ANTECUBITAL Performed at [place] [address], [place], [place] [address]    Special Requests   Final    BOTTLES DRAWN AEROBIC AND ANAEROBIC Blood Culture results may not be optimal due  to an excessive volume of blood received in culture bottles Performed at [place] [address] [place], [place] [address]    Culture   Final    NO GROWTH 4 DAYS Performed at [facility], [place] [address], [place], [place] [address]    Report Status PENDING  Incomplete  Urine culture     Status: None   Collection Time: 03/30/19  6:01 PM   Specimen: In/Out Cath Urine  Result Value Ref Range Status   Specimen Description   Final    IN/OUT CATH URINE Performed at [place] [address], [place], [place] [address]    Special Requests   Final    NONE Performed at [facility], [place] [address], [place], [place] [address]    Culture   Final    NO GROWTH Performed at [facility], [place] [address] [place], [place] [address]    Report Status 04/01/2019 FINAL  Final  Respiratory Panel by RT PCR (Flu A&B, Covid) - Nasopharyngeal Swab     Status: Abnormal   Collection Time: 03/30/19  7:31 PM   Specimen: Nasopharyngeal Swab  Result Value Ref Range Status   SARS Coronavirus 2 by RT PCR POSITIVE (A) NEGATIVE Final    Comment: RESULT CALLED TO, READ BACK BY AND VERIFIED WITH: N,MOSCINSKI AT 2306 ON 03/30/19 BY A,MOHAMED (NOTE) SARS-CoV-2 target nucleic acids are DETECTED. SARS-CoV-2 RNA is generally detectable in upper respiratory specimens  during the acute phase of infection. Positive results are indicative of the presence of the identified virus, but do not rule out bacterial infection or co-infection with other pathogens not detected by the test. Clinical correlation with patient history and other diagnostic information is necessary to determine patient infection status. The expected result is Negative. Fact Sheet for Patients:  PinkCheek.be Fact Sheet for Healthcare Providers: GravelBags.it This test is not yet approved or cleared by the [place] FDA and  has been authorized for detection and/or diagnosis of SARS-CoV-2 by FDA under an  Emergency Use Authorization (EUA).  This EUA will remain in effect (meaning this test can be  used) for the duration of  the COVID-19 declaration under Section 564(b)(1) of the Act, 21 U.S.C. section 360bbb-3(b)(1), unless the authorization is terminated or revoked sooner.    Influenza A by PCR NEGATIVE NEGATIVE Final   Influenza B by PCR NEGATIVE NEGATIVE Final    Comment: (NOTE) The Xpert Xpress SARS-CoV-2/FLU/RSV assay is intended as an aid in  the diagnosis of influenza from Nasopharyngeal swab specimens and  should not be used as a sole basis for treatment. Nasal washings and  aspirates are unacceptable for Xpert Xpress SARS-CoV-2/FLU/RSV  testing. Fact Sheet for Patients: PinkCheek.be Fact Sheet for Healthcare Providers: GravelBags.it This test is not yet approved or cleared by the [place] FDA and  has been authorized for detection and/or diagnosis of SARS-CoV-2 by  FDA under an Emergency Use Authorization (EUA). This EUA will remain  in effect (meaning this test can be used) for the duration of the  Covid-19 declaration under Section 564(b)(1) of the Act, 21  U.S.C. section 360bbb-3(b)(1), unless the authorization is  terminated or revoked. Performed at [facility], [place] [address] [place],  [address]   MRSA PCR Screening     Status: None   Collection Time: 04/01/19  9:36 PM   Specimen: Nasal Mucosa; Nasopharyngeal  Result Value Ref Range Status   MRSA by  PCR NEGATIVE NEGATIVE Final    Comment:        The GeneXpert MRSA Assay (FDA approved for NASAL specimens only), is one component of a comprehensive MRSA colonization surveillance program. It is not intended to diagnose MRSA infection nor to guide or monitor treatment for MRSA infections. Performed at [facility], [place] [address], [place], [place] [address]     Radiology Reports CT ABDOMEN PELVIS WO  CONTRAST  Result Date: 03/30/2019 CLINICAL DATA:  Sepsis abdominal pain and distension EXAM: CT ABDOMEN AND PELVIS WITHOUT CONTRAST TECHNIQUE: Multidetector CT imaging of the abdomen and pelvis was performed following the standard protocol without IV contrast. COMPARISON:  None. FINDINGS: Lower chest: There is mild cardiomegaly. A small left pleural effusion is seen. Patchy airspace opacity seen at the right lung base. The visualized portions of the lungs are clear. Hepatobiliary: Although limited due to the lack of intravenous contrast, normal in appearance without gross focal abnormality. Layering calcified gallstones are present. Pancreas:  Unremarkable.  No surrounding inflammatory changes. Spleen: Normal in size. Although limited due to the lack of intravenous contrast, normal in appearance. Adrenals/Urinary Tract: Limited visualization of the adrenal glands. The kidneys and collecting system appear normal without evidence of urinary tract calculus or hydronephrosis. Tiny calcifications seen adjacent to the right renal hilum which could be renal vascular. Bladder is unremarkable. Stomach/Bowel: Although limited the stomach, small bowel and colon are grossly unremarkable. Scattered colonic diverticula are noted. Vascular/Lymphatic: Scattered aortic atherosclerotic calcifications are seen without aneurysmal dilatation. Reproductive: Calcified uterine fibroid is seen. Other: There appears to be diffuse anasarca. Musculoskeletal: Diffuse osteopenia is seen. Bilateral hip osteoarthritis is seen with superior joint space loss and marginal osteophyte formation. IMPRESSION: Small left pleural effusion with patchy airspace opacity which could be due to atelectasis and/or early infectious etiology. Limited examination due to technique and patient's body habitus, however no definite acute intra-abdominal or pelvic pathology. Cholelithiasis Diffuse anasarca Aortic Atherosclerosis (ICD10-I70.0). Electronically Signed    By: Prudencio Pair M.D.   On: 03/30/2019 21:33   DG Shoulder Right  Result Date: 03/08/2019 CLINICAL DATA:  Right shoulder pain, no known injury, initial encounter EXAM: RIGHT SHOULDER - 2+ VIEW COMPARISON:  None. FINDINGS: Degenerative changes of the glenohumeral joint are seen. Some deformity of the proximal humerus is seen likely related to prior fracture with healing. Degenerative changes of the acromioclavicular joint are noted as well. The underlying bony thorax shows no acute abnormality. IMPRESSION: Chronic changes in the shoulder without acute abnormality. Electronically Signed   By: Inez Catalina M.D.   On: 03/08/2019 21:30   CT HEAD WO CONTRAST  Result Date: 03/08/2019 CLINICAL DATA:  Recent falls with headaches and neck pain, initial encounter EXAM: CT HEAD WITHOUT CONTRAST CT CERVICAL SPINE WITHOUT CONTRAST TECHNIQUE: Multidetector CT imaging of the head and cervical spine was performed following the standard protocol without intravenous contrast. Multiplanar CT image reconstructions of the cervical spine were also generated. COMPARISON:  07/19/2009 FINDINGS: CT HEAD FINDINGS Brain: Heavy calcification of the basal ganglia is seen bilaterally and stable. Increasing chronic white matter ischemic change is noted. Mild atrophic changes are seen as well. Vascular: No hyperdense vessel or unexpected calcification. Skull: Normal. Negative for fracture or focal lesion. Sinuses/Orbits: No acute finding. Other: None. CT CERVICAL SPINE FINDINGS Alignment: Within normal limits. Skull base and vertebrae: Exam is somewhat limited by patient motion artifact. Seven cervical segments are well visualized. Vertebral body height is well maintained. Disc space narrowing is noted from C2-C6  with mild osteophytic changes. No acute fracture or acute facet abnormality is noted. The odontoid is within normal limits. Soft tissues and spinal canal: Surrounding soft tissue structures show no acute abnormality. Upper chest:  Visualized lung apices are unremarkable. Other: None IMPRESSION: CT of the head: Chronic atrophic and ischemic changes without acute abnormality. CT of the cervical spine: Somewhat limited exam due to patient motion artifact. Multilevel degenerative change is seen without acute abnormality. Electronically Signed   By: Inez Catalina M.D.   On: 03/08/2019 21:39   CT ANGIO CHEST PE W OR WO CONTRAST  Result Date: 03/31/2019 CLINICAL DATA:  Elevated D-dimer. EXAM: CT ANGIOGRAPHY CHEST WITH CONTRAST TECHNIQUE: Multidetector CT imaging of the chest was performed using the standard protocol during bolus administration of intravenous contrast. Multiplanar CT image reconstructions and MIPs were obtained to evaluate the vascular anatomy. CONTRAST:  13mL OMNIPAQUE IOHEXOL 350 MG/ML SOLN COMPARISON:  None. FINDINGS: Cardiovascular: Satisfactory opacification of the pulmonary arteries to the segmental level. Small pulmonary embolus in the right lower lobe segmental pulmonary artery cardiomegaly. Thoracic aortic atherosclerosis. No pericardial effusion. Mediastinum/Nodes: No enlarged mediastinal, hilar, or axillary lymph nodes. Thyroid gland, trachea, and esophagus demonstrate no significant findings. Lungs/Pleura: Small bilateral pleural effusions. Left basilar atelectasis. Mild interstitial prominence. No focal consolidation. No pneumothorax. Upper Abdomen: No acute abnormality. Musculoskeletal: No acute osseous abnormality. No aggressive osseous lesion. Severe dextroscoliosis of the thoracolumbar spine. No aggressive osseous lesion. Severe osteoarthritis of the right glenohumeral joint. Severe osteoarthritis of the sternoclavicular joints. Other: Diffuse anasarca. Review of the MIP images confirms the above findings. IMPRESSION: 1. Small pulmonary embolus in the right lower lobe segmental pulmonary artery cardiomegaly. RV/LV ratio 1.3 which can be seen with right heart strain, but there is likely underlying right heart  failure in this patient with low PE burden. 2. Findings concerning for mild CHF.  Diffuse anasarca. 3. Aortic Atherosclerosis (ICD10-I70.0). Critical Value/emergent results were called by telephone at the time of interpretation on 03/31/2019 at 4:29 am to provider [facility] , who verbally acknowledged these results. Electronically Signed   By: Kathreen Devoid   On: 03/31/2019 04:46   CT CERVICAL SPINE WO CONTRAST  Result Date: 03/08/2019 CLINICAL DATA:  Recent falls with headaches and neck pain, initial encounter EXAM: CT HEAD WITHOUT CONTRAST CT CERVICAL SPINE WITHOUT CONTRAST TECHNIQUE: Multidetector CT imaging of the head and cervical spine was performed following the standard protocol without intravenous contrast. Multiplanar CT image reconstructions of the cervical spine were also generated. COMPARISON:  07/19/2009 FINDINGS: CT HEAD FINDINGS Brain: Heavy calcification of the basal ganglia is seen bilaterally and stable. Increasing chronic white matter ischemic change is noted. Mild atrophic changes are seen as well. Vascular: No hyperdense vessel or unexpected calcification. Skull: Normal. Negative for fracture or focal lesion. Sinuses/Orbits: No acute finding. Other: None. CT CERVICAL SPINE FINDINGS Alignment: Within normal limits. Skull base and vertebrae: Exam is somewhat limited by patient motion artifact. Seven cervical segments are well visualized. Vertebral body height is well maintained. Disc space narrowing is noted from C2-C6 with mild osteophytic changes. No acute fracture or acute facet abnormality is noted. The odontoid is within normal limits. Soft tissues and spinal canal: Surrounding soft tissue structures show no acute abnormality. Upper chest: Visualized lung apices are unremarkable. Other: None IMPRESSION: CT of the head: Chronic atrophic and ischemic changes without acute abnormality. CT of the cervical spine: Somewhat limited exam due to patient motion artifact. Multilevel  degenerative change is seen without acute abnormality. Electronically Signed  By: Inez Catalina M.D.   On: 03/08/2019 21:39   DG Chest Port 1 View  Result Date: 03/30/2019 CLINICAL DATA:  Abdominal pain. EXAM: PORTABLE CHEST 1 VIEW COMPARISON:  March 14, 2019 FINDINGS: Moderate to marked severity left basilar atelectasis and/or infiltrate is seen. This is stable in severity when compared to the prior study. A 6 mm calcified nodular opacity is seen overlying the mid right lung. A small left pleural effusion is noted which is mildly increased in size. No pneumothorax is identified. The cardiac silhouette is mildly enlarged and unchanged in size. Radiopaque surgical clips are seen along the lateral aspect of the mid left hemithorax. Degenerative changes seen throughout the thoracic spine with stable moderate to marked severity dextroscoliosis. IMPRESSION: 1. Moderate to marked severity left basilar atelectasis and/or infiltrate. This is stable in severity when compared to the prior study. 2. Small left pleural effusion which is mildly increased in size. Electronically Signed   By: Virgina Norfolk M.D.   On: 03/30/2019 18:40   DG CHEST PORT 1 VIEW  Result Date: 03/14/2019 CLINICAL DATA:  Respiratory failure, pneumonia EXAM: PORTABLE CHEST 1 VIEW COMPARISON:  03/11/2019 FINDINGS: Cardiomegaly. Vascular congestion and bilateral interstitial and airspace opacities, worsening since prior study. Suspect small effusions. No acute bony abnormality. IMPRESSION: Worsening bilateral interstitial and airspace opacities, edema versus infection. Small bilateral effusions. Electronically Signed   By: Rolm Baptise M.D.   On: 03/14/2019 08:24   DG CHEST PORT 1 VIEW  Result Date: 03/11/2019 CLINICAL DATA:  86 year old female with history of shortness of breath. EXAM: PORTABLE CHEST 1 VIEW COMPARISON:  Chest x-ray 03/08/2019. FINDINGS: There is cephalization of the pulmonary vasculature and slight indistinctness of the  interstitial markings suggestive of mild pulmonary edema. Trace right pleural effusion. Mild cardiomegaly. The patient is rotated to the left on today's exam, resulting in distortion of the mediastinal contours and reduced diagnostic sensitivity and specificity for mediastinal pathology. Aortic atherosclerosis. Surgical clips projecting over the left axillary region, likely from prior lymph node dissection. IMPRESSION: 1. The appearance of the chest is most compatible with mild congestive heart failure, as above. 2. Aortic atherosclerosis. Electronically Signed   By: Vinnie Langton M.D.   On: 03/11/2019 04:50   DG Chest Port 1 View  Result Date: 03/08/2019 CLINICAL DATA:  Fall, fever EXAM: PORTABLE CHEST 1 VIEW COMPARISON:  Radiograph August 30, 2013 FINDINGS: Diffuse hazy interstitial opacities throughout both lungs with more patchy opacity in the right lung base and a small right pleural effusion. No pneumothorax. Cardiac silhouette is borderline enlarged. The aorta is calcified and tortuous. The osseous structures appear diffusely demineralized which may limit detection of small or nondisplaced fractures. Degenerative changes are present in the imaged spine and shoulders. Stable dextrocurvature of the thoracic spine. Postsurgical changes in the left axilla. IMPRESSION: Diffuse hazy interstitial opacities throughout both lungs with more patchy opacity in the right lung base and a small right pleural effusion. Findings may represent pulmonary edema and/or multifocal pneumonia. Electronically Signed   By: Lovena Le M.D.   On: 03/08/2019 18:19   ECHOCARDIOGRAM COMPLETE  Result Date: 03/09/2019    ECHOCARDIOGRAM REPORT   Patient Name:   JOELEE STALLSWORTH Date of Exam: 03/09/2019 Medical Rec #:  [id number]        Height:       63.0 in Accession #:    [id number]       Weight:       89.9 lb Date of Birth:  April 13, 1918  BSA:          1.377 m Patient Age:    75 years         BP:           102/62 mmHg Patient  Gender: F                HR:           85 bpm. Exam Location:  Inpatient Procedure: 2D Echo, Color Doppler and Cardiac Doppler Indications:    R06.9 DOE  History:        Patient has no prior history of Echocardiogram examinations.                 Risk Factors:Dyslipidemia. Mastectomy.  Sonographer:    Raquel Sarna Senior RDCS Referring Phys: [phone] [place]  1. Left ventricular ejection fraction, by estimation, is 60 to 65%. The left ventricle has normal function. The left ventricle has no regional wall motion abnormalities. Left ventricular diastolic parameters are consistent with Grade I diastolic dysfunction (impaired relaxation).  2. Right ventricular systolic function is normal. The right ventricular size is normal. There is moderately elevated pulmonary artery systolic pressure.  3. Left atrial size was moderately dilated.  4. Right atrial size was moderately dilated.  5. The mitral valve is normal in structure and function. Trivial mitral valve regurgitation.  6. Tricuspid valve regurgitation is moderate.  7. The aortic valve is tricuspid. Aortic valve regurgitation is trivial. Moderate aortic valve stenosis. FINDINGS  Left Ventricle: Left ventricular ejection fraction, by estimation, is 60 to 65%. The left ventricle has normal function. The left ventricle has no regional wall motion abnormalities. The left ventricular internal cavity size was normal in size. There is  no left ventricular hypertrophy. Left ventricular diastolic parameters are consistent with Grade I diastolic dysfunction (impaired relaxation). Right Ventricle: The right ventricular size is normal. No increase in right ventricular wall thickness. Right ventricular systolic function is normal. There is moderately elevated pulmonary artery systolic pressure. The tricuspid regurgitant velocity is 3.26 m/s, and with an assumed right atrial pressure of 3 mmHg, the estimated right ventricular systolic pressure is [id number] mmHg. Left Atrium:  Left atrial size was moderately dilated. Right Atrium: Right atrial size was moderately dilated. Pericardium: There is no evidence of pericardial effusion. Mitral Valve: The mitral valve is normal in structure and function. There is mild calcification of the mitral valve leaflet(s). Trivial mitral valve regurgitation. Tricuspid Valve: The tricuspid valve is normal in structure. Tricuspid valve regurgitation is moderate. Aortic Valve: The aortic valve is tricuspid. Aortic valve regurgitation is trivial. Moderate aortic stenosis is present. There is moderate calcification of the aortic valve. Aortic valve mean gradient measures 15.0 mmHg. Aortic valve peak gradient measures 25.8 mmHg. Aortic valve area, by VTI measures 0.72 cm. Pulmonic Valve: The pulmonic valve was normal in structure. Pulmonic valve regurgitation is not visualized. Aorta: The aortic root and ascending aorta are structurally normal, with no evidence of dilitation. IAS/Shunts: No atrial level shunt detected by color flow Doppler.  LEFT VENTRICLE PLAX 2D LVIDd:         3.50 cm  Diastology LVIDs:         2.60 cm  LV e' lateral:   3.09 cm/s LV PW:         1.20 cm  LV E/e' lateral: 20.0 LV IVS:        1.00 cm  LV e' medial:    2.47 cm/s LVOT diam:  1.80 cm  LV E/e' medial:  25.0 LV SV:         35 LV SV Index:   25 LVOT Area:     2.54 cm  RIGHT VENTRICLE RV S prime:     8.83 cm/s TAPSE (M-mode): 1.4 cm LEFT ATRIUM             Index       RIGHT ATRIUM           Index LA diam:        3.40 cm 2.47 cm/m  RA Area:     18.50 cm LA Vol (A2C):   66.2 ml 48.08 ml/m RA Volume:   58.40 ml  42.42 ml/m LA Vol (A4C):   48.0 ml 34.86 ml/m LA Biplane Vol: 56.8 ml 41.25 ml/m  AORTIC VALVE AV Area (Vmax):    0.76 cm AV Area (Vmean):   0.72 cm AV Area (VTI):     0.72 cm AV Vmax:           254.00 cm/s AV Vmean:          185.000 cm/s AV VTI:            0.487 m AV Peak Grad:      25.8 mmHg AV Mean Grad:      15.0 mmHg LVOT Vmax:         75.70 cm/s LVOT Vmean:         52.700 cm/s LVOT VTI:          0.137 m LVOT/AV VTI ratio: 0.28  AORTA Ao Root diam: 2.30 cm Ao Asc diam:  2.30 cm MITRAL VALVE                TRICUSPID VALVE MV Area (PHT): 2.87 cm     TR Peak grad:   42.5 mmHg MV Decel Time: 264 msec     TR Vmax:        326.00 cm/s MV E velocity: 61.70 cm/s MV A velocity: 111.00 cm/s  SHUNTS MV E/A ratio:  0.56         Systemic VTI:  0.14 m                             Systemic Diam: 1.80 cm Glori Bickers MD Electronically signed by Glori Bickers MD Signature Date/Time: 03/09/2019/6:22:33 PM    Final    VAS [place] LOWER EXTREMITY VENOUS (DVT)  Result Date: 03/11/2019  Lower Venous DVTStudy Indications: Elevated Ddimer.  Risk Factors: None identified. Limitations: Patient positioning, patient immobility, patient pain tolerance. Comparison Study: No prior studies. Performing Technologist: Oliver Hum RVT  Examination Guidelines: A complete evaluation includes B-mode imaging, spectral Doppler, color Doppler, and power Doppler as needed of all accessible portions of each vessel. Bilateral testing is considered an integral part of a complete examination. Limited examinations for reoccurring indications may be performed as noted. The reflux portion of the exam is performed with the patient in reverse Trendelenburg.  +---------+---------------+---------+-----------+----------+--------------+ RIGHT    CompressibilityPhasicitySpontaneityPropertiesThrombus Aging +---------+---------------+---------+-----------+----------+--------------+ CFV      Full           Yes      Yes                                 +---------+---------------+---------+-----------+----------+--------------+ SFJ      Full                                                        +---------+---------------+---------+-----------+----------+--------------+  FV Prox  Full                                                         +---------+---------------+---------+-----------+----------+--------------+ FV Mid   Full                                                        +---------+---------------+---------+-----------+----------+--------------+ FV DistalFull                                                        +---------+---------------+---------+-----------+----------+--------------+ PFV      Full                                                        +---------+---------------+---------+-----------+----------+--------------+ POP      Full           Yes      Yes                                 +---------+---------------+---------+-----------+----------+--------------+ PTV      Full                                                        +---------+---------------+---------+-----------+----------+--------------+ PERO     Full                                                        +---------+---------------+---------+-----------+----------+--------------+   +---------+---------------+---------+-----------+----------+--------------+ LEFT     CompressibilityPhasicitySpontaneityPropertiesThrombus Aging +---------+---------------+---------+-----------+----------+--------------+ CFV      Full           Yes      Yes                                 +---------+---------------+---------+-----------+----------+--------------+ SFJ      Full                                                        +---------+---------------+---------+-----------+----------+--------------+ FV Prox  Full                                                        +---------+---------------+---------+-----------+----------+--------------+  FV Mid   Full                                                        +---------+---------------+---------+-----------+----------+--------------+ FV DistalFull                                                         +---------+---------------+---------+-----------+----------+--------------+ PFV      Full                                                        +---------+---------------+---------+-----------+----------+--------------+ POP      Full           Yes      Yes                                 +---------+---------------+---------+-----------+----------+--------------+ PTV      Full                                                        +---------+---------------+---------+-----------+----------+--------------+ PERO     Full                                                        +---------+---------------+---------+-----------+----------+--------------+     Summary: RIGHT: - There is no evidence of deep vein thrombosis in the lower extremity.  - No cystic structure found in the popliteal fossa.  LEFT: - There is no evidence of deep vein thrombosis in the lower extremity.  - No cystic structure found in the popliteal fossa.  *See table(s) above for measurements and observations. Electronically signed by Monica Martinez MD on 03/11/2019 at 11:06:42 AM.    Final    ECHOCARDIOGRAM LIMITED  Result Date: 04/01/2019    ECHOCARDIOGRAM LIMITED REPORT   Patient Name:   NASHAYLA DEC Date of Exam: 04/01/2019 Medical Rec #:  [id number]        Height:       63.0 in Accession #:    [id number]       Weight:       89.9 lb Date of Birth:  11-Aug-1918        BSA:          1.377 m Patient Age:    23 years         BP:           174/74 mmHg Patient Gender: F                HR:           70 bpm. Exam Location:  Inpatient Procedure:  Limited Echo Indications:    415.19 pulmonary embolus  History:        Patient has prior history of Echocardiogram examinations, most                 recent 03/09/2019. Risk Factors:Former Smoker.  Sonographer:    Jannett Celestine RDCS (AE) Referring Phys: [id number] Makaylie Dedeaux K Konstantin Lehnen  Sonographer Comments: No parasternal window and suboptimal apical window. Image acquisition challenging due to  mastectomy. restricted mobility (patient unable to straighten knees) IMPRESSIONS  1. Left ventricular ejection fraction, by estimation, is 55 to 60%. The left ventricle has normal function. There is mild concentric left ventricular hypertrophy.  2. Right ventricular systolic function is mildly reduced. The right ventricular size is moderately enlarged. Mildly increased right ventricular wall thickness. There is moderately elevated pulmonary artery systolic pressure. The estimated right ventricular systolic pressure is Q000111Q mmHg.  3. Left atrial size was severely dilated.  4. Right atrial size was severely dilated.  5. The mitral valve is degenerative. Mild to moderate mitral valve regurgitation.  6. Tricuspid valve regurgitation is moderate to severe.  7. Aortic valve is heavily calcified with restricted movement. Doppler interrogation is not optimal. Suspect mild to moderate aortic stenosis is present. The aortic valve is tricuspid. Aortic valve regurgitation is not visualized.  8. The inferior vena cava is normal in size with <50% respiratory variability, suggesting right atrial pressure of 8 mmHg. Comparison(s): A prior study was performed on 03/09/2019. Changes from prior study are noted. RV appears moderately dilated on current study. LVEF remains unchanged. FINDINGS  Left Ventricle: Left ventricular ejection fraction, by estimation, is 55 to 60%. The left ventricle has normal function. The left ventricular internal cavity size was normal in size. There is mild concentric left ventricular hypertrophy. Right Ventricle: The right ventricular size is moderately enlarged. Mildly increased right ventricular wall thickness. Right ventricular systolic function is mildly reduced. There is moderately elevated pulmonary artery systolic pressure. The tricuspid regurgitant velocity is 3.19 m/s, and with an assumed right atrial pressure of 8 mmHg, the estimated right ventricular systolic pressure is Q000111Q mmHg. Left Atrium:  Left atrial size was severely dilated. Right Atrium: Right atrial size was severely dilated. Pericardium: A small pericardial effusion is present. The pericardial effusion is circumferential. Presence of pericardial fat pad. Mitral Valve: The mitral valve is degenerative in appearance. Mild to moderate mitral valve regurgitation. Tricuspid Valve: Tricuspid valve regurgitation is moderate to severe. Aortic Valve: Aortic valve is heavily calcified with restricted movement. Doppler interrogation is not optimal. Suspect mild to moderate aortic stenosis is present. The aortic valve is tricuspid. Aortic valve regurgitation is not visualized. Aortic valve  mean gradient measures 7.0 mmHg. Aortic valve peak gradient measures 14.3 mmHg. Venous: The inferior vena cava is normal in size with less than 50% respiratory variability, suggesting right atrial pressure of 8 mmHg. Additional Comments: There is a small pleural effusion in the left lateral region.  AORTIC VALVE AV Vmax:           189.00 cm/s AV Vmean:          119.000 cm/s AV VTI:            0.423 m AV Peak Grad:      14.3 mmHg AV Mean Grad:      7.0 mmHg LVOT Vmax:         57.40 cm/s LVOT Vmean:        41.600 cm/s LVOT VTI:  0.150 m LVOT/AV VTI ratio: 0.35 TRICUSPID VALVE TR Peak grad:   40.7 mmHg TR Vmax:        319.00 cm/s  SHUNTS Systemic VTI: 0.15 m Eleonore Chiquito MD Electronically signed by Eleonore Chiquito MD Signature Date/Time: 04/01/2019/1:15:41 PM    Final     Lab Data:  CBC: Recent Labs  Lab 03/30/19 1730 03/31/19 0555 04/01/19 0826 04/02/19 0422 04/03/19 0459  WBC 2.9* 2.0* 2.0* 1.9*  1.8* 1.9*  NEUTROABS 1.7  --   --  1.1* 1.4*  HGB 10.0* 8.6* 11.2* 12.7  12.5 9.9*  HCT 34.7* 30.1* 37.8 44.2  42.9 33.4*  MCV 93.5 93.8 90.6 94.0  92.3 90.3  PLT 674* 452* 678* 759*  745* [id number]*   Basic Metabolic Panel: Recent Labs  Lab 03/30/19 1730 03/30/19 1730 03/31/19 0555 04/01/19 0011 04/01/19 0826 04/02/19 0422 04/03/19 0459  NA  148*  --  144  --  145 146* 146*  K 3.7  --  3.8  --  3.2* 4.0 3.7  CL 113*  --  115*  --  109 108 107  CO2 24  --  22  --  24 23 27   GLUCOSE 120*  --  102*  --  72 75 118*  BUN 27*  --  23  --  29* 31* 40*  CREATININE 1.36*   < > 1.03* 1.17* 1.11* 1.05* 1.20*  CALCIUM 7.9*  --  6.8*  --  7.7* 8.0* 7.9*   < > = values in this interval not displayed.   GFR: Estimated Creatinine Clearance: 16.5 mL/min (A) (by C-G formula based on SCr of 1.2 mg/dL (H)). Liver Function Tests: Recent Labs  Lab 03/30/19 1730 04/01/19 0826  AST 32 53*  ALT 18 25  ALKPHOS 66 156*  BILITOT 0.4 0.5  PROT 6.6 6.2*  ALBUMIN 2.6* 2.2*   No results for input(s): LIPASE, AMYLASE in the last 168 hours. No results for input(s): AMMONIA in the last 168 hours. Coagulation Profile: Recent Labs  Lab 03/30/19 1730  INR 1.2   Cardiac Enzymes: No results for input(s): CKTOTAL, CKMB, CKMBINDEX, TROPONINI in the last 168 hours. BNP (last 3 results) No results for input(s): PROBNP in the last 8760 hours. HbA1C: No results for input(s): HGBA1C in the last 72 hours. CBG: No results for input(s): GLUCAP in the last 168 hours. Lipid Profile: No results for input(s): CHOL, HDL, LDLCALC, TRIG, CHOLHDL, LDLDIRECT in the last 72 hours. Thyroid Function Tests: No results for input(s): TSH, T4TOTAL, FREET4, T3FREE, THYROIDAB in the last 72 hours. Anemia Panel: Recent Labs    04/02/19 0422 04/03/19 0459  FERRITIN 396* 414*   Urine analysis:    Component Value Date/Time   COLORURINE YELLOW 03/30/2019 1801   APPEARANCEUR CLEAR 03/30/2019 1801   LABSPEC 1.017 03/30/2019 1801   PHURINE 5.0 03/30/2019 1801   GLUCOSEU NEGATIVE 03/30/2019 1801   GLUCOSEU NEGATIVE 01/19/2017 1346   HGBUR SMALL (A) 03/30/2019 1801   BILIRUBINUR NEGATIVE 03/30/2019 1801   BILIRUBINUR neg 03/09/2013 1447   KETONESUR NEGATIVE 03/30/2019 1801   PROTEINUR 100 (A) 03/30/2019 1801   UROBILINOGEN 0.2 01/19/2017 1346   NITRITE NEGATIVE  03/30/2019 1801   LEUKOCYTESUR NEGATIVE 03/30/2019 1801     Jobany Montellano M.D. [facility] 04/03/2019, 2:37 PM   Call night coverage person covering after 7pm

## 2019-04-03 NOTE — Progress Notes (Signed)
[facility] for IV heparin  Indication: pulmonary embolus  Allergies  Allergen Reactions  . Penicillins     REACTION: Question of penicillin allergy Did it involve swelling of the face/tongue/throat, SOB, or low BP? Unknown Did it involve sudden or severe rash/hives, skin peeling, or any reaction on the inside of your mouth or nose? Unknown Did you need to seek medical attention at a hospital or doctor's office? Unknown When did it last happen?Unknown If all above answers are "NO", may proceed with cephalosporin use.    Patient Measurements: Height: 5\' 3"  (160 cm) Weight: 89 lb 15.2 oz (40.8 kg) IBW/kg (Calculated) : 52.4  Vital Signs: Temp: 97.1 F (36.2 C) (03/30 0528) Temp Source: Rectal (03/30 0528) BP: 125/77 (03/30 0528) Pulse Rate: 76 (03/30 0528)  Labs: Recent Labs    04/01/19 0826 04/01/19 0826 04/01/19 1905 04/02/19 0422 04/03/19 0459  HGB 11.2*   < >  --  12.7  12.5 9.9*  HCT 37.8  --   --  44.2  42.9 33.4*  PLT 678*  --   --  759*  745* 787*  HEPARINUNFRC 0.79*   < > 0.52 0.44 0.61  CREATININE 1.11*  --   --  1.05* 1.20*   < > = values in this interval not displayed.   Estimated Creatinine Clearance: 16.5 mL/min (A) (by C-G formula based on SCr of 1.2 mg/dL (H)).  Medical History: Past Medical History:  Diagnosis Date  . Adenocarcinoma of breast ([place])   . Allergic rhinitis   . Anemia   . Anxiety   . Depression   . Rheumatoid arthritis(714.0)    Medications:  Scheduled:  . vitamin C  500 mg Oral Daily  . dexamethasone (DECADRON) injection  6 mg Intravenous Q24H  . feeding supplement (ENSURE ENLIVE)  237 mL Oral BID BM  . furosemide  40 mg Intravenous Q12H  . levothyroxine  25 mcg Oral Q0600  . multivitamin with minerals  1 tablet Oral Daily  . zinc sulfate  220 mg Oral Daily   Infusions:  . ceFEPime (MAXIPIME) IV Stopped (04/02/19 1921)  . heparin 600 Units/hr (04/03/19 [id number])  . remdesivir 100  mg in NS 100 mL Stopped (04/02/19 1422)   Assessment: 86 yo female with COVID PNA, ddimer > 5, now found to have a PE per CT scan to start IV heparin per pharmacy dosing.   Goal of Therapy:  Heparin level 0.3-0.7 units/ml Monitor platelets by anticoagulation protocol: Yes   Today, 04/03/2019  HL = 0.61  Hgb down some but has been fluctuating this admission, PLT stable  No bleeding per RN   Plan:   Continue Heparin at 600 units/hr  Difficult stick  Daily CBC, daily Hep level  Ulice Dash D PharmD 04/03/2019,7:53 AM

## 2019-04-03 NOTE — Plan of Care (Signed)
SW with daughter to update and discuss importance of patient having 24/7 care at home.  Discussed that patient is pocketing food and that we're still having trouble with keep temperature up (though warm blankets are being placed and heat packs - patient (since only alert to self -  continues to remove and "play with them" and pull them off.  Daughter hopes to have another discussion with rest of family - to discuss plan for when patient is discharged. Stated they are afraid to send to facility since they feel mom was not treated well when at the prior facility.  I informed that it may be possible to send to a different rehab - if discussed with SW.  No further questions at this time.

## 2019-04-04 ENCOUNTER — Ambulatory Visit: Payer: Medicare Other | Admitting: Internal Medicine

## 2019-04-04 ENCOUNTER — Other Ambulatory Visit: Payer: Self-pay

## 2019-04-04 DIAGNOSIS — E876 Hypokalemia: Secondary | ICD-10-CM

## 2019-04-04 DIAGNOSIS — A419 Sepsis, unspecified organism: Secondary | ICD-10-CM

## 2019-04-04 DIAGNOSIS — I2699 Other pulmonary embolism without acute cor pulmonale: Secondary | ICD-10-CM

## 2019-04-04 DIAGNOSIS — E870 Hyperosmolality and hypernatremia: Secondary | ICD-10-CM

## 2019-04-04 DIAGNOSIS — E039 Hypothyroidism, unspecified: Secondary | ICD-10-CM

## 2019-04-04 LAB — BASIC METABOLIC PANEL
Anion gap: 10 (ref 5–15)
BUN: 48 mg/dL — ABNORMAL HIGH (ref 8–23)
CO2: 27 mmol/L (ref 22–32)
Calcium: 7.7 mg/dL — ABNORMAL LOW (ref 8.9–10.3)
Chloride: 110 mmol/L (ref 98–111)
Creatinine, Ser: 1.19 mg/dL — ABNORMAL HIGH (ref 0.44–1.00)
GFR calc Af Amer: 44 mL/min — ABNORMAL LOW (ref >60)
GFR calc non Af Amer: 38 mL/min — ABNORMAL LOW (ref >60)
Glucose, Bld: 72 mg/dL (ref 70–99)
Potassium: 3.4 mmol/L — ABNORMAL LOW (ref 3.5–5.1)
Sodium: 147 mmol/L — ABNORMAL HIGH (ref 135–145)

## 2019-04-04 LAB — T3: T3, Total: 54 ng/dL — ABNORMAL LOW (ref 71–180)

## 2019-04-04 LAB — CULTURE, BLOOD (ROUTINE X 2)
Culture: NO GROWTH
Culture: NO GROWTH

## 2019-04-04 LAB — DIFFERENTIAL
Abs Immature Granulocytes: 0.06 10*3/uL (ref 0.00–0.07)
Basophils Absolute: 0 10*3/uL (ref 0.0–0.1)
Basophils Relative: 1 %
Eosinophils Absolute: 0 10*3/uL (ref 0.0–0.5)
Eosinophils Relative: 1 %
Immature Granulocytes: 3 %
Lymphocytes Relative: 28 %
Lymphs Abs: 0.6 10*3/uL — ABNORMAL LOW (ref 0.7–4.0)
Monocytes Absolute: 0.3 10*3/uL (ref 0.1–1.0)
Monocytes Relative: 16 %
Neutro Abs: 1.1 10*3/uL — ABNORMAL LOW (ref 1.7–7.7)
Neutrophils Relative %: 51 %

## 2019-04-04 LAB — HEPARIN LEVEL (UNFRACTIONATED): Heparin Unfractionated: 0.69 IU/mL (ref 0.30–0.70)

## 2019-04-04 LAB — CBC
HCT: 29.6 % — ABNORMAL LOW (ref 36.0–46.0)
Hemoglobin: 8.6 g/dL — ABNORMAL LOW (ref 12.0–15.0)
MCH: 26.8 pg (ref 26.0–34.0)
MCHC: 29.1 g/dL — ABNORMAL LOW (ref 30.0–36.0)
MCV: 92.2 fL (ref 80.0–100.0)
Platelets: 830 10*3/uL — ABNORMAL HIGH (ref 150–400)
RBC: 3.21 MIL/uL — ABNORMAL LOW (ref 3.87–5.11)
RDW: 22.4 % — ABNORMAL HIGH (ref 11.5–15.5)
WBC: 2.2 10*3/uL — ABNORMAL LOW (ref 4.0–10.5)
nRBC: 0 % (ref 0.0–0.2)

## 2019-04-04 LAB — FERRITIN: Ferritin: 340 ng/mL — ABNORMAL HIGH (ref 11–307)

## 2019-04-04 LAB — C-REACTIVE PROTEIN: CRP: 5 mg/dL — ABNORMAL HIGH (ref <1.0)

## 2019-04-04 LAB — D-DIMER, QUANTITATIVE: D-Dimer, Quant: 1.99 ug/mL-FEU — ABNORMAL HIGH (ref 0.00–0.50)

## 2019-04-04 MED ORDER — LEVOTHYROXINE SODIUM 100 MCG/5ML IV SOLN
12.5000 ug | Freq: Every day | INTRAVENOUS | Status: DC
  Administered 2019-04-04 – 2019-04-07 (×4): 12.5 ug via INTRAVENOUS
  Filled 2019-04-04 (×5): qty 5

## 2019-04-04 MED ORDER — METOPROLOL TARTRATE 5 MG/5ML IV SOLN
2.5000 mg | Freq: Three times a day (TID) | INTRAVENOUS | Status: DC
  Administered 2019-04-04 – 2019-04-07 (×10): 2.5 mg via INTRAVENOUS
  Filled 2019-04-04 (×8): qty 5

## 2019-04-04 MED ORDER — HEPARIN (PORCINE) 25000 UT/250ML-% IV SOLN
500.0000 [IU]/h | INTRAVENOUS | Status: AC
  Administered 2019-04-04 (×2): 500 [IU]/h via INTRAVENOUS

## 2019-04-04 MED ORDER — POTASSIUM CHLORIDE 10 MEQ/100ML IV SOLN
10.0000 meq | INTRAVENOUS | Status: AC
  Administered 2019-04-04 (×3): 10 meq via INTRAVENOUS
  Filled 2019-04-04 (×3): qty 100

## 2019-04-04 MED ORDER — DEXTROSE 5 % IV SOLN: INTRAVENOUS | Status: AC

## 2019-04-04 NOTE — Progress Notes (Signed)
Resumed heparin infusion at 1629

## 2019-04-04 NOTE — Progress Notes (Addendum)
PROGRESS NOTE    Angela [name]  TGP:[id number] DOB: 07/03/1918 DOA: 03/30/2019 PCP: [name], MD    Brief Narrative:  Patient is a 86 year old female with history of breast CA status post mastectomy, anemia, anxiety, rheumatoid arthritis, hypothyroidism, recent hospital admission 3/4-3/10/2019 (secondary to multifocal pneumonia, hyponatremia, AKI, acute diastolic CHF, AVNRT) presented to ED with abdominal pain, fevers, 103.1 F, hypotensive BP 88/60, tachycardia 130, tachypnea with respiratory rate of 40.  O2 sats 98% on room air Patient was also found to have wound in the buttocks, sacral region Chest x-ray showed moderate to marked severity left basilar atelectasis and/or infiltrates.  Patient was placed on vancomycin, cefepime, metronidazole. COVID-19 positive   Assessment & Plan:   Principal Problem:   Pneumonia due to COVID-19 virus Active Problems:   Sepsis ([place])   Acute respiratory failure with hypoxia (HCC)   Acute diastolic CHF (congestive heart failure) (HCC)   AKI (acute kidney injury) ([place])   Acute respiratory failure (HCC)   Hypothyroidism   Hypernatremia   Pulmonary embolus (HCC)  1 acute hypoxemic respiratory failure due to acute COVID-19 viral pneumonia, POA/acute PE/sepsis secondary to HCAP Patient presented with systemic inflammatory response syndrome with hypotension, tachycardia tachypnea, COVID-19 was positive.  Chest x-ray showed moderate to marked severe left base atelectasis/infiltrates.  COVID-19 was positive.  CT angiogram chest showed a small pulmonary embolus in the right lower lobe in the segmental pulmonary artery with right heart strain.  Patient pancultured with cultures pending.  Inflammatory markers of D-dimer, CRP, ferritin trending down.  Patient currently afebrile.  Sats of 91% on room air.  Continue Decadron, IV remdesivir day 5/5, IV heparin, empiric IV cefepime.  Supportive care.  2.  Pulmonary embolism Per CT angiogram chest that showed  an acute right lower lobe PE with possible right heart strain.  Unable to get lower extremity Dopplers due to contractures.  2D echo with a EF of 55 to 60%, mildly reduced right ventricular systolic function, moderately elevated PA pressure.  Patient currently on IV heparin.  Follow.  3.  Acute diastolic CHF exacerbation 2D echo done from 03/09/2019 with normal LV function, grade 1 diastolic dysfunction.  Concern that patient volume overloaded on admission with significant bilateral lower extremity edema and JVD.  CT angiogram chest done showed a PE with possible right heart strain.  Volume overload improved.  Patient with borderline blood pressure and tachycardia and as such diuretics on hold.  Patient received a 500 cc bolus of normal saline.  Change oral Lopressor to IV Lopressor as patient refusing and pocketing medications.  Monitor for now.  4.  Acute kidney injury Felt secondary to possible acute CHF, hypotension with decreased renal perfusion and COVID-19 pneumonia.  Creatinine currently of 1.19.  Follow.  5.  Hypernatremia Likely secondary to poor oral intake.  Patient noted to be on diuretics which have been discontinued.  Placed on gentle hydration with D5W.  Follow.  6.  Hypokalemia Replete.  7.  Hypothyroidism TSH noted to be at 12.9.  It was noted per admitting physician that patient had not taken her thyroid medication over a month.  Continue current dose of Synthroid.  Will need repeat thyroid function studies done in about 4 to 6 weeks in the outpatient setting.  8.  Injury documentation Right hip lateral stage II/mid sacrum stage II, medial aspect of left knee Continue current wound care recommendations.  9.  Severe protein calorie malnutrition/probable underlying dementia Patient with severe protein calorie malnutrition.  Patient  per RN pocketing pills and refusing oral intake.  Could likely be secondary to acute infection with underlying dementia.  Supportive care.  10.   Goals of care Patient with advanced age with multiple comorbidities, now with COVID-19 pneumonia.  Palliative care was consulted and decision was made to continue aggressive care at this time.  Will likely need palliative care to follow-up in the outpatient setting once patient is discharged.   DVT prophylaxis: Heparin Code Status: DNR Family Communication: updated daughter, Allean Found via telephone Disposition Plan:  . Patient came from: Home            . Anticipated d/c place: To be determined.  Likely back home. . Barriers to d/c OR conditions which need to be met to effect a safe d/c: Improvement with sepsis, pneumonia with concern for questionable CHF, COVID-19 respiratory failure.   Consultants:   Palliative care: Dr. Domingo Cocking 04/01/2019  Procedures:   CT angiogram chest 03/31/2019  CT abdomen and pelvis 03/30/2019  2D echo 04/01/2019    Antimicrobials  IV cefepime 03/30/2019  IV Flagyl 3/26/2021x1 dose  IV vancomycin 3/26/2021x1 dose   Subjective: Patient somewhat nonverbal.  Not really answering questions when asked whether any shortness of breath or chest pain.  Per RN patient pocketing pills and it refusing oral intake although per RN patient with no difficulty swallowing.  Objective: Vitals:   04/04/19 0632 04/04/19 1420 04/04/19 1421 04/04/19 1701  BP: (!) 122/43 91/63 (!) 93/37 99/62  Pulse: 65 (!) 122 (!) 119 (!) 128  Resp: 16 16 16    Temp: (!) 97.3 F (36.3 C)  (!) 97.1 F (36.2 C)   TempSrc:   Rectal   SpO2: 91% 94%    Weight:      Height:        Intake/Output Summary (Last 24 hours) at 04/04/2019 1902 Last data filed at 04/04/2019 1800 Gross per 24 hour  Intake 684.51 ml  Output 600 ml  Net 84.51 ml   Filed Weights   03/30/19 1757  Weight: 40.8 kg    Examination:  General exam: NAD.  Cachectic.  Frail. Respiratory system: Clear to auscultation anterior lung fields.  Diminished breath sounds.  No use of accessory muscles of  respiration.   Cardiovascular system: S1 & S2 heard, RRR. No JVD, murmurs, rubs, gallops or clicks. No pedal edema. Gastrointestinal system: Abdomen is nondistended, soft and nontender. No organomegaly or masses felt. Normal bowel sounds heard. Central nervous system: Alert. No focal neurological deficits. Extremities: Contractures in upper and lower extremities.  Skin: Sacral, right hip, medial aspect of left knee wounds.  Psychiatry: Judgement and insight appear poor. Mood & affect appropriate.             Data Reviewed: I have personally reviewed following labs and imaging studies  CBC: Recent Labs  Lab 03/30/19 1730 03/30/19 1730 03/31/19 0555 04/01/19 0826 04/02/19 0422 04/03/19 0459 04/04/19 0415  WBC 2.9*   < > 2.0* 2.0* 1.9*  1.8* 1.9* 2.2*  NEUTROABS 1.7  --   --   --  1.1* 1.4* 1.1*  HGB 10.0*   < > 8.6* 11.2* 12.7  12.5 9.9* 8.6*  HCT 34.7*   < > 30.1* 37.8 44.2  42.9 33.4* 29.6*  MCV 93.5   < > 93.8 90.6 94.0  92.3 90.3 92.2  PLT 674*   < > 452* 678* 759*  745* 787* 830*   < > = values in this interval not displayed.   Basic Metabolic  Panel: Recent Labs  Lab 03/31/19 0555 03/31/19 0555 04/01/19 0011 04/01/19 0826 04/02/19 0422 04/03/19 0459 04/04/19 0415  NA 144  --   --  145 146* 146* 147*  K 3.8  --   --  3.2* 4.0 3.7 3.4*  CL 115*  --   --  109 108 107 110  CO2 22  --   --  24 23 27 27   GLUCOSE 102*  --   --  72 75 118* 72  BUN 23  --   --  29* 31* 40* 48*  CREATININE 1.03*   < > 1.17* 1.11* 1.05* 1.20* 1.19*  CALCIUM 6.8*  --   --  7.7* 8.0* 7.9* 7.7*   < > = values in this interval not displayed.   GFR: Estimated Creatinine Clearance: 16.6 mL/min (A) (by C-G formula based on SCr of 1.19 mg/dL (H)). Liver Function Tests: Recent Labs  Lab 03/30/19 1730 04/01/19 0826  AST 32 53*  ALT 18 25  ALKPHOS 66 156*  BILITOT 0.4 0.5  PROT 6.6 6.2*  ALBUMIN 2.6* 2.2*   No results for input(s): LIPASE, AMYLASE in the last 168 hours. No  results for input(s): AMMONIA in the last 168 hours. Coagulation Profile: Recent Labs  Lab 03/30/19 1730  INR 1.2   Cardiac Enzymes: No results for input(s): CKTOTAL, CKMB, CKMBINDEX, TROPONINI in the last 168 hours. BNP (last 3 results) No results for input(s): PROBNP in the last 8760 hours. HbA1C: No results for input(s): HGBA1C in the last 72 hours. CBG: Recent Labs  Lab 04/03/19 1605  GLUCAP 133*   Lipid Profile: No results for input(s): CHOL, HDL, LDLCALC, TRIG, CHOLHDL, LDLDIRECT in the last 72 hours. Thyroid Function Tests: Recent Labs    04/03/19 1711  FREET4 0.84   Anemia Panel: Recent Labs    04/03/19 0459 04/04/19 0415  FERRITIN 414* 340*   Sepsis Labs: Recent Labs  Lab 03/30/19 1730 03/30/19 1931 04/02/19 0422 04/03/19 0459  PROCALCITON 0.23  --  0.61 0.66  LATICACIDVEN 3.1* 1.9  --   --     Recent Results (from the past 240 hour(s))  Culture, blood (Routine x 2)     Status: None   Collection Time: 03/30/19  5:30 PM   Specimen: BLOOD  Result Value Ref Range Status   Specimen Description   Final    BLOOD RIGHT ANTECUBITAL Performed at [facility], [place] [address], [place], [place] [address]    Special Requests   Final    BOTTLES DRAWN AEROBIC AND ANAEROBIC Blood Culture results may not be optimal due to an excessive volume of blood received in culture bottles Performed at [place] [address] [place], [place] [address]    Culture   Final    NO GROWTH 5 DAYS Performed at [facility], [place] [address] [place], [place] [address]    Report Status 04/04/2019 FINAL  Final  Culture, blood (Routine x 2)     Status: None   Collection Time: 03/30/19  5:40 PM   Specimen: BLOOD  Result Value Ref Range Status   Specimen Description   Final    BLOOD LEFT ANTECUBITAL Performed at [facility] [address], [place], [place] [address]    Special Requests   Final    BOTTLES  DRAWN AEROBIC AND ANAEROBIC Blood Culture results may not be optimal due to an excessive volume of blood received in culture bottles Performed at [facility] [place] [address], [place], [place] [address]    Culture   Final    NO GROWTH 5 DAYS Performed at [facility], [place] [address] [place], [place] [address]    Report Status 04/04/2019 FINAL  Final  Urine culture     Status: None   Collection Time: 03/30/19  6:01 PM   Specimen: In/Out Cath Urine  Result Value Ref Range Status   Specimen Description   Final    IN/OUT CATH URINE Performed at [facility] [address], [place], [place] [address]    Special Requests   Final    NONE Performed at [facility], [place] [address] [place], [place] [address]    Culture   Final    NO GROWTH Performed at [facility], [place] [address], [place], [place] [address]    Report Status 04/01/2019 FINAL  Final  Respiratory Panel by RT PCR (Flu A&B, Covid) - Nasopharyngeal Swab     Status: Abnormal   Collection Time: 03/30/19  7:31 PM   Specimen: Nasopharyngeal Swab  Result Value Ref Range Status   SARS Coronavirus 2 by RT PCR POSITIVE (A) NEGATIVE Final    Comment: RESULT CALLED TO, READ BACK BY AND VERIFIED WITH: N,MOSCINSKI AT 2306 ON 03/30/19 BY A,MOHAMED (NOTE) SARS-CoV-2 target nucleic acids are DETECTED. SARS-CoV-2 RNA is generally detectable in upper respiratory specimens  during the acute phase of infection. Positive results are indicative of the presence of the identified virus, but do not rule out bacterial infection or co-infection with other pathogens not detected by the test. Clinical correlation with patient history and other diagnostic information is necessary to determine patient infection status. The expected result is Negative. Fact Sheet for Patients:  PinkCheek.be Fact Sheet for Healthcare  Providers: GravelBags.it This test is not yet approved or cleared by the [place] FDA and  has been authorized for detection and/or diagnosis of SARS-CoV-2 by FDA under an Emergency Use Authorization (EUA).  This EUA will remain in effect (meaning this test can be  used) for the duration of  the COVID-19 declaration under Section 564(b)(1) of the Act, 21 U.S.C. section 360bbb-3(b)(1), unless the authorization is terminated or revoked sooner.    Influenza A by PCR NEGATIVE NEGATIVE Final   Influenza B by PCR NEGATIVE NEGATIVE Final    Comment: (NOTE) The Xpert Xpress SARS-CoV-2/FLU/RSV assay is intended as an aid in  the diagnosis of influenza from Nasopharyngeal swab specimens and  should not be used as a sole basis for treatment. Nasal washings and  aspirates are unacceptable for Xpert Xpress SARS-CoV-2/FLU/RSV  testing. Fact Sheet for Patients: PinkCheek.be Fact Sheet for Healthcare Providers: GravelBags.it This test is not yet approved or cleared by the [place] FDA and  has been authorized for detection and/or diagnosis of SARS-CoV-2 by  FDA under an Emergency Use Authorization (EUA). This EUA will remain  in effect (meaning this test can be used) for the duration of the  Covid-19 declaration under Section 564(b)(1) of the Act, 21  U.S.C. section 360bbb-3(b)(1), unless the authorization is  terminated or revoked. Performed at [facility], [place] [address], [place], [place] [address]   MRSA PCR Screening     Status: None   Collection Time: 04/01/19  9:36 PM   Specimen: Nasal Mucosa; Nasopharyngeal  Result Value Ref Range Status   MRSA by PCR NEGATIVE NEGATIVE Final    Comment:  The GeneXpert MRSA Assay (FDA approved for NASAL specimens only), is one component of a comprehensive MRSA colonization surveillance program. It is not intended to diagnose  MRSA infection nor to guide or monitor treatment for MRSA infections. Performed at [facility], [place] [address] [place], [place] [address]   Culture, blood (Routine X 2) w Reflex to ID Panel     Status: None (Preliminary result)   Collection Time: 04/03/19  3:52 PM   Specimen: BLOOD LEFT FOREARM  Result Value Ref Range Status   Specimen Description   Final    BLOOD LEFT FOREARM Performed at [place] [address], [place], [place] [address]    Special Requests   Final    BOTTLES DRAWN AEROBIC ONLY Blood Culture adequate volume Performed at [place] [address], [place], [place] [address]    Culture   Final    NO GROWTH < 24 HOURS Performed at [place] [address] [place], [place] [address]    Report Status PENDING  Incomplete  Culture, blood (Routine X 2) w Reflex to ID Panel     Status: None (Preliminary result)   Collection Time: 04/03/19  5:11 PM   Specimen: BLOOD LEFT ARM  Result Value Ref Range Status   Specimen Description   Final    BLOOD LEFT ARM Performed at [place] [address], [place], [place] [address]    Special Requests   Final    BOTTLES DRAWN AEROBIC ONLY Blood Culture results may not be optimal due to an inadequate volume of blood received in culture bottles Performed at [facility] [facility] [address] [place],  [address]    Culture   Final    NO GROWTH < 24 HOURS Performed at [place] [address], [place],  [address]    Report Status PENDING  Incomplete         Radiology Studies: DG Chest Port 1 View  Result Date: 04/03/2019 CLINICAL DATA:  Pneumonia.  COVID-19 positive. EXAM: PORTABLE CHEST 1 VIEW COMPARISON:  March 30, 2019 FINDINGS: The heart size is significantly enlarged. Aortic calcifications are noted. There is no pneumothorax. There are hazy bilateral airspace opacities. There is some  scarring versus atelectasis at the lung bases and lung apices. Advanced degenerative changes are noted of the right glenohumeral joint. IMPRESSION: 1. Cardiomegaly. 2. Subtle hazy bilateral airspace opacities are nonspecific and may be secondary to pulmonary edema or an atypical infectious process such as viral pneumonia. Electronically Signed   By: Constance Holster M.D.   On: 04/03/2019 16:37        Scheduled Meds: . vitamin C  500 mg Oral Daily  . citalopram  10 mg Oral Daily  . dexamethasone (DECADRON) injection  6 mg Intravenous Q24H  . feeding supplement (ENSURE ENLIVE)  237 mL Oral BID BM  . levothyroxine  12.5 mcg Intravenous Daily  . metoprolol tartrate  2.5 mg Intravenous Q8H  . multivitamin with minerals  1 tablet Oral Daily  . zinc sulfate  220 mg Oral Daily   Continuous Infusions: . ceFEPime (MAXIPIME) IV 2 g (04/04/19 1700)  . dextrose 50 mL/hr at 04/04/19 0925  . heparin 500 Units/hr (04/04/19 1629)     LOS: 5 days    Time spent: 35 minutes    Irine Seal, MD [facility]   To contact the attending provider between 7A-7P or the covering provider during  after hours 7P-7A, please log into the web site [email or web address] and access using universal [place] password for that web site. If you do not have the password, please call the hospital operator.  04/04/2019, 7:02 PM

## 2019-04-04 NOTE — Progress Notes (Signed)
   04/04/19 1421  Vitals  Temp (!) 97.1 F (36.2 C)  Temp Source Rectal  BP (!) 93/37  MAP (mmHg) (!) 50  BP Location Left Leg  BP Method Automatic  Patient Position (if appropriate) Lying  Pulse Rate (!) 119  Pulse Rate Source Dinamap  Resp 16  MEWS Score  MEWS Temp 0  MEWS Systolic 1  MEWS Pulse 2  MEWS RR 0  MEWS LOC 0  MEWS Score 3  MEWS Score Color Yellow  pt has not been taking in any oral intake. MD is aware, no additional orders. Will continue to monitor.

## 2019-04-04 NOTE — Progress Notes (Signed)
Upon entering room, pt was found completely undressed with mittens and telemetry electrodes off. This RN assessed pt and found that pt has pulled IV that is running heparin. After stopping heparin infusion and bandaging pt's IV site, RN notified pharmacy/ MD of incident. Pharmacy suggest to restart heparin once new IV access is established, MD is made aware.

## 2019-04-04 NOTE — Progress Notes (Signed)
Son Dewana Deflorio called and updated.

## 2019-04-04 NOTE — Progress Notes (Signed)
[facility] for IV heparin  Indication: pulmonary embolus  Allergies  Allergen Reactions  . Penicillins     REACTION: Question of penicillin allergy Did it involve swelling of the face/tongue/throat, SOB, or low BP? Unknown Did it involve sudden or severe rash/hives, skin peeling, or any reaction on the inside of your mouth or nose? Unknown Did you need to seek medical attention at a hospital or doctor's office? Unknown When did it last happen?Unknown If all above answers are "NO", may proceed with cephalosporin use.    Patient Measurements: Height: 5\' 3"  (160 cm) Weight: 89 lb 15.2 oz (40.8 kg) IBW/kg (Calculated) : 52.4  Vital Signs: Temp: 97.3 F (36.3 C) (03/31 0632) BP: 122/43 (03/31 [id number]) Pulse Rate: 65 (03/31 0632)  Labs: Recent Labs    04/02/19 0422 04/02/19 0422 04/03/19 0459 04/04/19 0415  HGB 12.7  12.5   < > 9.9* 8.6*  HCT 44.2  42.9  --  33.4* 29.6*  PLT 759*  745*  --  787* 830*  HEPARINUNFRC 0.44  --  0.61 0.69  CREATININE 1.05*  --  1.20* 1.19*   < > = values in this interval not displayed.   Estimated Creatinine Clearance: 16.6 mL/min (A) (by C-G formula based on SCr of 1.19 mg/dL (H)).  Medical History: Past Medical History:  Diagnosis Date  . Adenocarcinoma of breast ([place])   . Allergic rhinitis   . Anemia   . Anxiety   . Depression   . Rheumatoid arthritis(714.0)    Medications:  Scheduled:  . vitamin C  500 mg Oral Daily  . citalopram  10 mg Oral Daily  . dexamethasone (DECADRON) injection  6 mg Intravenous Q24H  . feeding supplement (ENSURE ENLIVE)  237 mL Oral BID BM  . levothyroxine  25 mcg Oral Q0600  . metoprolol tartrate  25 mg Oral BID  . multivitamin with minerals  1 tablet Oral Daily  . zinc sulfate  220 mg Oral Daily   Infusions:  . ceFEPime (MAXIPIME) IV 2 g (04/03/19 1742)  . heparin 600 Units/hr (04/03/19 1533)   Assessment: 86 yo female with COVID PNA, ddimer > 5, now  found to have a PE per CT scan to start IV heparin per pharmacy dosing.   Goal of Therapy:  Heparin level 0.3-0.7 units/ml Monitor platelets by anticoagulation protocol: Yes   Today, 04/04/2019  HL = 0.69  Hgb decr again, but has been fluctuating this admission, PLT elevated - 830  No bleeding per RN   Plan:   Reduce Heparin to 500 units/hr  No hep level recheck today  Difficult stick  Daily CBC, daily Hep level  Minda Ditto PharmD 04/04/2019,7:09 AM

## 2019-04-05 LAB — CBC WITH DIFFERENTIAL/PLATELET
Abs Immature Granulocytes: 0.03 10*3/uL (ref 0.00–0.07)
Basophils Absolute: 0 10*3/uL (ref 0.0–0.1)
Basophils Relative: 1 %
Eosinophils Absolute: 0 10*3/uL (ref 0.0–0.5)
Eosinophils Relative: 0 %
HCT: 29.5 % — ABNORMAL LOW (ref 36.0–46.0)
Hemoglobin: 8.5 g/dL — ABNORMAL LOW (ref 12.0–15.0)
Immature Granulocytes: 2 %
Lymphocytes Relative: 16 %
Lymphs Abs: 0.3 10*3/uL — ABNORMAL LOW (ref 0.7–4.0)
MCH: 26.2 pg (ref 26.0–34.0)
MCHC: 28.8 g/dL — ABNORMAL LOW (ref 30.0–36.0)
MCV: 91 fL (ref 80.0–100.0)
Monocytes Absolute: 0.2 10*3/uL (ref 0.1–1.0)
Monocytes Relative: 7 %
Neutro Abs: 1.5 10*3/uL — ABNORMAL LOW (ref 1.7–7.7)
Neutrophils Relative %: 74 %
Platelets: 835 10*3/uL — ABNORMAL HIGH (ref 150–400)
RBC: 3.24 MIL/uL — ABNORMAL LOW (ref 3.87–5.11)
RDW: 22.4 % — ABNORMAL HIGH (ref 11.5–15.5)
WBC: 2 10*3/uL — ABNORMAL LOW (ref 4.0–10.5)
nRBC: 0 % (ref 0.0–0.2)

## 2019-04-05 LAB — BASIC METABOLIC PANEL
Anion gap: 10 (ref 5–15)
BUN: 46 mg/dL — ABNORMAL HIGH (ref 8–23)
CO2: 25 mmol/L (ref 22–32)
Calcium: 7.7 mg/dL — ABNORMAL LOW (ref 8.9–10.3)
Chloride: 109 mmol/L (ref 98–111)
Creatinine, Ser: 1.16 mg/dL — ABNORMAL HIGH (ref 0.44–1.00)
GFR calc Af Amer: 45 mL/min — ABNORMAL LOW (ref >60)
GFR calc non Af Amer: 39 mL/min — ABNORMAL LOW (ref >60)
Glucose, Bld: 126 mg/dL — ABNORMAL HIGH (ref 70–99)
Potassium: 3.8 mmol/L (ref 3.5–5.1)
Sodium: 144 mmol/L (ref 135–145)

## 2019-04-05 LAB — D-DIMER, QUANTITATIVE: D-Dimer, Quant: 1.92 ug/mL-FEU — ABNORMAL HIGH (ref 0.00–0.50)

## 2019-04-05 LAB — C-REACTIVE PROTEIN: CRP: 4.6 mg/dL — ABNORMAL HIGH (ref <1.0)

## 2019-04-05 LAB — HEPARIN LEVEL (UNFRACTIONATED): Heparin Unfractionated: 0.34 IU/mL (ref 0.30–0.70)

## 2019-04-05 LAB — FERRITIN: Ferritin: 371 ng/mL — ABNORMAL HIGH (ref 11–307)

## 2019-04-05 MED ORDER — ENOXAPARIN SODIUM 40 MG/0.4ML ~~LOC~~ SOLN
40.0000 mg | SUBCUTANEOUS | Status: DC
  Administered 2019-04-05 – 2019-04-12 (×8): 40 mg via SUBCUTANEOUS
  Filled 2019-04-05 (×8): qty 0.4

## 2019-04-05 NOTE — Progress Notes (Addendum)
PROGRESS NOTE    Angela [name]  PPH:[id number] DOB: 1918/08/04 DOA: 03/30/2019 PCP: [name], MD    Brief Narrative:  Angela [name] is a 86 year old female with history of breast CA status post mastectomy, anemia, anxiety, rheumatoid arthritis, hypothyroidism, recent hospital admission 3/4-3/10/2019 (secondary to multifocal pneumonia, hyponatremia, AKI, acute diastolic CHF, AVNRT) presented to ED with abdominal pain, fevers, 103.1 F, hypotensive BP 88/60, tachycardia 130, tachypnea with respiratory rate of 40.  O2 sats 98% on room air Angela [name] was also found to have wound in the buttocks, sacral region Chest x-ray showed moderate to marked severity left basilar atelectasis and/or infiltrates.  Angela [name] was placed on vancomycin, cefepime, metronidazole. COVID-19 positive   Assessment & Plan:   Principal Problem:   Pneumonia due to COVID-19 virus Active Problems:   Sepsis ([place])   Acute respiratory failure with hypoxia (HCC)   Acute diastolic CHF (congestive heart failure) (HCC)   AKI (acute kidney injury) ([place])   Acute respiratory failure (HCC)   Hypothyroidism   Hypernatremia   Pulmonary embolus (HCC)  1 acute hypoxemic respiratory failure due to acute COVID-19 viral pneumonia, POA/acute PE/sepsis secondary to HCAP Angela [name] presented with systemic inflammatory response syndrome with hypotension, tachycardia tachypnea, COVID-19 was positive.  Chest x-ray showed moderate to marked severe left base atelectasis/infiltrates.  COVID-19 was positive.  CT angiogram chest showed a small pulmonary embolus in the right lower lobe in the segmental pulmonary artery with right heart strain.  Angela [name] pancultured with cultures pending with no growth to date..  Inflammatory markers of D-dimer, CRP, ferritin trending down.  Angela [name] currently afebrile.  Sats of 91% on room air.  Continue Decadron.  Status post 5 days of IV remdesivir.  Will change IV heparin to Lovenox.  Discontinue IV cefepime after  today's doses as Angela [name] would have had 7 days of antibiotic treatment.  Supportive care.  2.  Pulmonary embolism Per CT angiogram chest that showed an acute right lower lobe PE with possible right heart strain.  Unable to get lower extremity Dopplers due to concern for contractures.  2D echo with a EF of 55 to 60%, mildly reduced right ventricular systolic function, moderately elevated PA pressure.  Angela [name] currently on IV heparin.  Will transition from IV heparin to full dose Lovenox.  Follow.  3.  Acute diastolic CHF exacerbation 2D echo done from 03/09/2019 with normal LV function, grade 1 diastolic dysfunction.  Concern that Angela [name] volume overloaded on admission with significant bilateral lower extremity edema and JVD.  CT angiogram chest done showed a PE with possible right heart strain.  Volume overload improved.  Angela [name] with borderline blood pressure and tachycardia and as such diuretics were held. Angela [name] received a 500 cc bolus of normal saline.  Oral Lopressor has been changed to IV Lopressor as Angela [name] noted yesterday to be refusing and pocketing medications.  Monitor for now.  4.  Acute kidney injury Felt secondary to possible acute CHF, hypotension with decreased renal perfusion and COVID-19 pneumonia.  Creatinine currently at 1.16.  Follow.  5.  Hypernatremia Likely secondary to poor oral intake.  Angela [name] noted to be on diuretics which have been discontinued.  Improving with gentle hydration of D5W.  Continue gentle hydration for another 24 hours and subsequently discontinue. Follow.  6.  Hypokalemia Repleted.    7.  Hypothyroidism TSH noted to be at 12.9.  It was noted per admitting physician that Angela [name] had not taken her thyroid medication over a month.  Continue current dose of Synthroid.  Will need repeat thyroid function studies done in about 4 to 6 weeks in the outpatient setting.  8.  Injury documentation Right hip lateral stage II/mid sacrum stage II, medial aspect of  left knee Continue current wound care recommendations.  9.  Severe protein calorie malnutrition/probable underlying dementia Angela [name] with severe protein calorie malnutrition.  Angela [name] per RN pocketing pills and refusing oral intake.  Could likely be secondary to acute infection with probable underlying dementia.  Supportive care.  10.  Goals of care Angela [name] with advanced age with multiple comorbidities, now with COVID-19 pneumonia.  Palliative care was consulted and decision was made to continue aggressive care at this time.  Will likely need palliative care to follow-up in the outpatient setting once Angela [name] is discharged.   DVT prophylaxis: Heparin Code Status: DNR Family Communication: updated son, [name] via telephone.  Disposition Plan:   Angela [name] came from: Home             Anticipated d/c place: To be determined.  Likely back home.  Barriers to d/c OR conditions which need to be met to effect a safe d/c: Improvement with sepsis, pneumonia with concern for questionable CHF, COVID-19 respiratory failure.   Consultants:   Palliative care: Dr. Domingo Cocking 04/01/2019  Procedures:   CT angiogram chest 03/31/2019  CT abdomen and pelvis 03/30/2019  2D echo 04/01/2019    Antimicrobials  IV cefepime 03/30/2019>>>>> 04/05/2019  IV Flagyl 3/26/2021x1 dose  IV vancomycin 3/26/2021x1 dose   Subjective: Angela [name] sleeping peacefully arousable but not very communicative.  Angela [name] drifting back off to sleep.  Objective: Vitals:   04/04/19 1701 04/04/19 2219 04/05/19 0609 04/05/19 0609  BP: 99/62 111/70  (!) 142/103  Pulse: (!) 128 (!) 130  72  Resp:  14  20  Temp:   (!) 96.9 F (36.1 C)   TempSrc:   Rectal   SpO2:  99%  100%  Weight:      Height:        Intake/Output Summary (Last 24 hours) at 04/05/2019 1216 Last data filed at 04/05/2019 1200 Gross per 24 hour  Intake 528.79 ml  Output --  Net 528.79 ml   Filed Weights   03/30/19 1757  Weight: 40.8 kg     Examination:  General exam: Cachectic.  Frail. Respiratory system: CTA B.  No wheezes, no crackles, no rhonchi.  Diminished breath sounds in the bases.  No use of accessory muscles of respiration.  Cardiovascular system: Regular rate and rhythm no murmurs rubs or gallops.  No JVD.  No lower extremity edema. Gastrointestinal system: Abdomen is soft, nontender, nondistended, positive bowel sounds.  No rebound.  No guarding.  Central nervous system: Alert. No focal neurological deficits. Extremities: No clubbing cyanosis or edema.  Moving extremities spontaneously.  Skin: Sacral, right hip, medial aspect of left knee wounds.  Psychiatry: Judgement and insight appear poor-fair. Mood & affect appropriate.             Data Reviewed: I have personally reviewed following labs and imaging studies  CBC: Recent Labs  Lab 03/30/19 1730 03/31/19 0555 04/01/19 0826 04/02/19 0422 04/03/19 0459 04/04/19 0415 04/05/19 0355  WBC 2.9*   < > 2.0* 1.9*   1.8* 1.9* 2.2* 2.0*  NEUTROABS 1.7  --   --  1.1* 1.4* 1.1* 1.5*  HGB 10.0*   < > 11.2* 12.7   12.5 9.9* 8.6* 8.5*  HCT 34.7*   < > 37.8 44.2   42.9 33.4* 29.6* 29.5*  MCV 93.5   < >  90.6 94.0   92.3 90.3 92.2 91.0  PLT 674*   < > 678* 759*   745* 787* 830* 835*   < > = values in this interval not displayed.   Basic Metabolic Panel: Recent Labs  Lab 04/01/19 0826 04/02/19 0422 04/03/19 0459 04/04/19 0415 04/05/19 0355  NA 145 146* 146* 147* 144  K 3.2* 4.0 3.7 3.4* 3.8  CL 109 108 107 110 109  CO2 24 23 27 27 25   GLUCOSE 72 75 118* 72 126*  BUN 29* 31* 40* 48* 46*  CREATININE 1.11* 1.05* 1.20* 1.19* 1.16*  CALCIUM 7.7* 8.0* 7.9* 7.7* 7.7*   GFR: Estimated Creatinine Clearance: 17 mL/min (A) (by C-G formula based on SCr of 1.16 mg/dL (H)). Liver Function Tests: Recent Labs  Lab 03/30/19 1730 04/01/19 0826  AST 32 53*  ALT 18 25  ALKPHOS 66 156*  BILITOT 0.4 0.5  PROT 6.6 6.2*  ALBUMIN 2.6* 2.2*   No results for  input(s): LIPASE, AMYLASE in the last 168 hours. No results for input(s): AMMONIA in the last 168 hours. Coagulation Profile: Recent Labs  Lab 03/30/19 1730  INR 1.2   Cardiac Enzymes: No results for input(s): CKTOTAL, CKMB, CKMBINDEX, TROPONINI in the last 168 hours. BNP (last 3 results) No results for input(s): PROBNP in the last 8760 hours. HbA1C: No results for input(s): HGBA1C in the last 72 hours. CBG: Recent Labs  Lab 04/03/19 1605  GLUCAP 133*   Lipid Profile: No results for input(s): CHOL, HDL, LDLCALC, TRIG, CHOLHDL, LDLDIRECT in the last 72 hours. Thyroid Function Tests: Recent Labs    04/03/19 1711  FREET4 0.84   Anemia Panel: Recent Labs    04/04/19 0415 04/05/19 0355  FERRITIN 340* 371*   Sepsis Labs: Recent Labs  Lab 03/30/19 1730 03/30/19 1931 04/02/19 0422 04/03/19 0459  PROCALCITON 0.23  --  0.61 0.66  LATICACIDVEN 3.1* 1.9  --   --     Recent Results (from the past 240 hour(s))  Culture, blood (Routine x 2)     Status: None   Collection Time: 03/30/19  5:30 PM   Specimen: BLOOD  Result Value Ref Range Status   Specimen Description   Final    BLOOD RIGHT ANTECUBITAL Performed at [facility], [place] [address] [place], [place] [address]    Special Requests   Final    BOTTLES DRAWN AEROBIC AND ANAEROBIC Blood Culture results may not be optimal due to an excessive volume of blood received in culture bottles Performed at [place] [address], [place], [place] [address]    Culture   Final    NO GROWTH 5 DAYS Performed at [facility], [place] [address] [place], [place] [address]    Report Status 04/04/2019 FINAL  Final  Culture, blood (Routine x 2)     Status: None   Collection Time: 03/30/19  5:40 PM   Specimen: BLOOD  Result Value Ref Range Status   Specimen Description   Final    BLOOD LEFT ANTECUBITAL Performed at [place] [address] [place], [place]  [address]    Special Requests   Final    BOTTLES DRAWN AEROBIC AND ANAEROBIC Blood Culture results may not be optimal due to an excessive volume of blood received in culture bottles Performed at [place] [address], [place], [place] [address]    Culture   Final    NO GROWTH 5 DAYS  Performed at [facility], [place] [address] [place], [place] [address]    Report Status 04/04/2019 FINAL  Final  Urine culture     Status: None   Collection Time: 03/30/19  6:01 PM   Specimen: In/Out Cath Urine  Result Value Ref Range Status   Specimen Description   Final    IN/OUT CATH URINE Performed at [place] [address], [place], [place] [address]    Special Requests   Final    NONE Performed at [facility], [place] [address] [place], [place] [address]    Culture   Final    NO GROWTH Performed at [facility], [place] [address] [place], [place] [address]    Report Status 04/01/2019 FINAL  Final  Respiratory Panel by RT PCR (Flu A&B, Covid) - Nasopharyngeal Swab     Status: Abnormal   Collection Time: 03/30/19  7:31 PM   Specimen: Nasopharyngeal Swab  Result Value Ref Range Status   SARS Coronavirus 2 by RT PCR POSITIVE (A) NEGATIVE Final    Comment: RESULT CALLED TO, READ BACK BY AND VERIFIED WITH: N,MOSCINSKI AT 2306 ON 03/30/19 BY A,MOHAMED (NOTE) SARS-CoV-2 target nucleic acids are DETECTED. SARS-CoV-2 RNA is generally detectable in upper respiratory specimens  during the acute phase of infection. Positive results are indicative of the presence of the identified virus, but do not rule out bacterial infection or co-infection with other pathogens not detected by the test. Clinical correlation with Angela [name] history and other diagnostic information is necessary to determine Angela [name] infection status. The expected result is Negative. Fact Sheet for Patients:  PinkCheek.be Fact Sheet for  Healthcare Providers: GravelBags.it This test is not yet approved or cleared by the [place] FDA and  has been authorized for detection and/or diagnosis of SARS-CoV-2 by FDA under an Emergency Use Authorization (EUA).  This EUA will remain in effect (meaning this test can be  used) for the duration of  the COVID-19 declaration under Section 564(b)(1) of the Act, 21 U.S.C. section 360bbb-3(b)(1), unless the authorization is terminated or revoked sooner.    Influenza A by PCR NEGATIVE NEGATIVE Final   Influenza B by PCR NEGATIVE NEGATIVE Final    Comment: (NOTE) The Xpert Xpress SARS-CoV-2/FLU/RSV assay is intended as an aid in  the diagnosis of influenza from Nasopharyngeal swab specimens and  should not be used as a sole basis for treatment. Nasal washings and  aspirates are unacceptable for Xpert Xpress SARS-CoV-2/FLU/RSV  testing. Fact Sheet for Patients: PinkCheek.be Fact Sheet for Healthcare Providers: GravelBags.it This test is not yet approved or cleared by the [place] FDA and  has been authorized for detection and/or diagnosis of SARS-CoV-2 by  FDA under an Emergency Use Authorization (EUA). This EUA will remain  in effect (meaning this test can be used) for the duration of the  Covid-19 declaration under Section 564(b)(1) of the Act, 21  U.S.C. section 360bbb-3(b)(1), unless the authorization is  terminated or revoked. Performed at [facility], [place] [address] [place], [place] [address]   MRSA PCR Screening     Status: None   Collection Time: 04/01/19  9:36 PM   Specimen: Nasal Mucosa; Nasopharyngeal  Result Value Ref Range Status   MRSA by PCR NEGATIVE NEGATIVE Final    Comment:        The GeneXpert MRSA Assay (FDA approved for NASAL specimens only), is one component of a comprehensive MRSA colonization surveillance program. It is  not intended to  diagnose MRSA infection nor to guide or monitor treatment for MRSA infections. Performed at [facility], [place] [address], [place], [place] [address]   Culture, blood (Routine X 2) w Reflex to ID Panel     Status: None (Preliminary result)   Collection Time: 04/03/19  3:52 PM   Specimen: BLOOD LEFT FOREARM  Result Value Ref Range Status   Specimen Description   Final    BLOOD LEFT FOREARM Performed at [place] [address], [place], [place] [address]    Special Requests   Final    BOTTLES DRAWN AEROBIC ONLY Blood Culture adequate volume Performed at [place] [address] [place], [place] [address]    Culture   Final    NO GROWTH 2 DAYS Performed at [place] [address], [place], [place] [address]    Report Status PENDING  Incomplete  Culture, blood (Routine X 2) w Reflex to ID Panel     Status: None (Preliminary result)   Collection Time: 04/03/19  5:11 PM   Specimen: BLOOD LEFT ARM  Result Value Ref Range Status   Specimen Description   Final    BLOOD LEFT ARM Performed at [place] [address] [place], [place] [address]    Special Requests   Final    BOTTLES DRAWN AEROBIC ONLY Blood Culture results may not be optimal due to an inadequate volume of blood received in culture bottles Performed at [place] [address] [place], [place] [address]    Culture   Final    NO GROWTH 2 DAYS Performed at [place] [address], [place], [place] [address]    Report Status PENDING  Incomplete         Radiology Studies: DG Chest Port 1 View  Result Date: 04/03/2019 CLINICAL DATA:  Pneumonia.  COVID-19 positive. EXAM: PORTABLE CHEST 1 VIEW COMPARISON:  March 30, 2019 FINDINGS: The heart size is significantly enlarged. Aortic calcifications are noted. There is no pneumothorax. There are hazy bilateral airspace opacities. There is some  scarring versus atelectasis at the lung bases and lung apices. Advanced degenerative changes are noted of the right glenohumeral joint. IMPRESSION: 1. Cardiomegaly. 2. Subtle hazy bilateral airspace opacities are nonspecific and may be secondary to pulmonary edema or an atypical infectious process such as viral pneumonia. Electronically Signed   By: Constance Holster M.D.   On: 04/03/2019 16:37        Scheduled Meds:  vitamin C  500 mg Oral Daily   citalopram  10 mg Oral Daily   dexamethasone (DECADRON) injection  6 mg Intravenous Q24H   enoxaparin (LOVENOX) injection  40 mg Subcutaneous Q24H   feeding supplement (ENSURE ENLIVE)  237 mL Oral BID BM   levothyroxine  12.5 mcg Intravenous Daily   metoprolol tartrate  2.5 mg Intravenous Q8H   multivitamin with minerals  1 tablet Oral Daily   zinc sulfate  220 mg Oral Daily   Continuous Infusions:  ceFEPime (MAXIPIME) IV 2 g (04/04/19 1700)   dextrose 50 mL/hr at 04/05/19 0600   heparin Stopped (04/05/19 1202)     LOS: 6 days    Time spent: 35 minutes    Irine Seal, MD [facility]   To contact the attending provider between 7A-7P or the covering provider during after hours 7P-7A, please log into the web site [email or web address] and access using universal [facility] password for that web site. If you do not have the password, please call the hospital operator.  04/05/2019, 12:16 PM

## 2019-04-05 NOTE — Care Management Important Message (Signed)
Important Message  Patient Details IM Letter given to Gabriel Earing RN Case Manager to present to the Patient Name: Angela [name] MRN: [id number] Date of Birth: July 16, 1918   Medicare Important Message Given:  Yes     Kerin Salen 04/05/2019, 10:57 AM

## 2019-04-05 NOTE — Progress Notes (Signed)
Attempted to call son, [name] who requested for updates on patient, no answer.

## 2019-04-05 NOTE — Progress Notes (Signed)
Pharmacy Antibiotic Note  Angela [name] is a 86 y.o. female admitted on 03/30/2019 with sepsis due to HCAP and COVID-19 PNA.  Pharmacy has been consulted for vancomycin and cefepime dosing. Vancomycin was stopped 3/28 due to negative MRSA PCR.  04/05/2019  D#7 Abx, remdesivir completed 3/30 Tm 97.1, SCr 1.16, CrCl 17 ml/min, WBC 2,  No  + cultures, 3/26 BCx NGFinal   Plan:  Continue cefepime 2 g IV q24 hr- consider stopping after today for 7 days tx  F/U renal function, final culture results, planned duration of therapy   Height: 5\' 3"  (160 cm) Weight: 40.8 kg (89 lb 15.2 oz) IBW/kg (Calculated) : 52.4  Temp (24hrs), Avg:97 F (36.1 C), Min:96.9 F (36.1 C), Max:97.1 F (36.2 C)  Recent Labs  Lab 03/30/19 1730 03/30/19 1931 03/31/19 0555 04/01/19 0826 04/01/19 1905 04/02/19 0422 04/03/19 0459 04/04/19 0415 04/05/19 0355  WBC 2.9*  --    < > 2.0*  --  1.9*  1.8* 1.9* 2.2* 2.0*  CREATININE 1.36*  --    < > 1.11*  --  1.05* 1.20* 1.19* 1.16*  LATICACIDVEN 3.1* 1.9  --   --   --   --   --   --   --   VANCORANDOM  --   --   --   --  8  --   --   --   --    < > = values in this interval not displayed.    Estimated Creatinine Clearance: 17 mL/min (A) (by C-G formula based on SCr of 1.16 mg/dL (H)).    Allergies  Allergen Reactions  . Penicillins     REACTION: Question of penicillin allergy Did it involve swelling of the face/tongue/throat, SOB, or low BP? Unknown Did it involve sudden or severe rash/hives, skin peeling, or any reaction on the inside of your mouth or nose? Unknown Did you need to seek medical attention at a hospital or doctor's office? Unknown When did it last happen?Unknown If all above answers are "NO", may proceed with cephalosporin use.    Antimicrobials this admission: 3/26 vanc >> 3/28 3/26 cefepime >>  3/26 remdesivir >> 3/30 Dose adjustments this admission: n/a Microbiology results: 3/26 BCx2: ngF 3/26 UCx: ngF 3/26 Flu:  neg/neg 3/26 Covid: positive 3/28 MRSA PCR neg 3/30 BCx2: ngtd  Thank you for allowing pharmacy to be a part of this patient's care.  Eudelia Bunch, Pharm.D [phone] 04/05/2019 10:07 AM

## 2019-04-05 NOTE — Progress Notes (Addendum)
[place] for IV heparin >>LMWH Indication: pulmonary embolus  Allergies  Allergen Reactions  . Penicillins     REACTION: Question of penicillin allergy Did it involve swelling of the face/tongue/throat, SOB, or low BP? Unknown Did it involve sudden or severe rash/hives, skin peeling, or any reaction on the inside of your mouth or nose? Unknown Did you need to seek medical attention at a hospital or doctor's office? Unknown When did it last happen?Unknown If all above answers are "NO", may proceed with cephalosporin use.    Patient Measurements: Height: 5\' 3"  (160 cm) Weight: 40.8 kg (89 lb 15.2 oz) IBW/kg (Calculated) : 52.4  Vital Signs: Temp: 96.9 F (36.1 C) (04/01 0609) Temp Source: Rectal (04/01 0609) BP: 142/103 (04/01 0609) Pulse Rate: 72 (04/01 0609)  Labs: Recent Labs    04/03/19 0459 04/03/19 0459 04/04/19 0415 04/05/19 0355  HGB 9.9*   < > 8.6* 8.5*  HCT 33.4*  --  29.6* 29.5*  PLT 787*  --  830* 835*  HEPARINUNFRC 0.61  --  0.69 0.34  CREATININE 1.20*  --  1.19* 1.16*   < > = values in this interval not displayed.   Estimated Creatinine Clearance: 17 mL/min (A) (by C-G formula based on SCr of 1.16 mg/dL (H)).  Assessment: 86 yo female with COVID PNA, ddimer > 5, now found to have a PE per CT scan to start IV heparin per pharmacy dosing.   Goal of Therapy:  Heparin level 0.3-0.7 units/ml Monitor platelets by anticoagulation protocol: Yes   Today, 04/05/2019  HL = 0.34 therapeutic on heparin 500 units/hr  Hgb 8.5. PLT elevated - 835  No bleeding reported   Plan:   Continue Heparin to 500 units/hr  Daily CBC, daily Hep level  F/u for transition to oral agent- consider switching to LMWH 2nd pt not taking pills- 40 qday would be full therapeutic dose for wt and renal fxn  Eudelia Bunch, Pharm.D [phone] 04/05/2019 9:53 AM  Addendum: To transition to Full dose LMWH for PE treatment.  Pt  unable/unwilling to take oral medicines.  Day #6 heparin drip.   Plan:  DC heparin drip Start LMWH 40 mg sq q24 which is full treatment dose 1 mg/kg for pt wt 40.8 & CrCl < 30 ml/min  Eudelia Bunch, Pharm.D [phone] 04/05/2019 12:01 PM

## 2019-04-05 NOTE — TOC Progression Note (Signed)
Transition of Care [facility]) - Progression Note    Patient Details  Name: Angela [name] MRN: [id number] Date of Birth: 11/03/1918  [facility]) CM/SW Contact  Purcell Mouton, RN Phone Number: 04/05/2019, 8:52 AM  Clinical Narrative:    Returned called pt's daughter this am. See will return my call because she could not remember what she was going to ask .        Expected Discharge Plan and Services                                                 Social Determinants of Health (SDOH) Interventions    Readmission Risk Interventions No flowsheet data found.

## 2019-04-05 NOTE — TOC Progression Note (Signed)
Transition of Care [facility]) - Progression Note    Patient Details  Name: CORDIE SPEAKMAN MRN: [id number] Date of Birth: August 19, 1918  Transition of Care [facility]) CM/SW Contact  Purcell Mouton, RN Phone Number: 04/05/2019, 4:34 PM  Clinical Narrative:    Pt's daughter [name] called to asked about SNF placement for pt. Explained to daughter that pt is total care and may not qualify for SNF. Spoke with son [name] who states that pt feels that they have left her and that she is depressed. Staff will try to call [facility] by video.         Expected Discharge Plan and Services                                                 Social Determinants of Health (SDOH) Interventions    Readmission Risk Interventions No flowsheet data found.

## 2019-04-05 NOTE — Evaluation (Signed)
Physical Therapy Evaluation Patient Details Name: Angela [name] MRN: [id number] DOB: May 09, 1918 Today's Date: 04/05/2019   History of Present Illness  Patient is a 86 year old female with history of breast CA status post mastectomy, anemia, anxiety, rheumatoid arthritis, hypothyroidism, recent hospital admission 3/4-3/10/2019 (secondary to multifocal pneumonia, hyponatremia, AKI, acute diastolic CHF, AVNRT) presented to ED with abdominal pain, fevers, 103.1 F, hypotensive BP 88/60, tachycardia 130, tachypnea  Clinical Impression  Unfortunately, the [patient is not able to participate in functional mobility, cries out and qithdraws when stimulated. Both LE's are contracted. Plans are for patient to return home. No skilled PT indicated. PT will sign off.    Follow Up Recommendations No PT follow up(plans to return home)    Equipment Recommendations  Hospital bed(low airloss mattress)    Recommendations for Other Services       Precautions / Restrictions Precautions Precaution Comments: Has bed sores. Restrictions Weight Bearing Restrictions: No      Mobility  Bed Mobility Overal bed mobility: Needs Assistance Bed Mobility: Rolling           General bed mobility comments: total assist to roll, did not attempt sitting as patient was withdrawing and moaning  Transfers                    Ambulation/Gait                Stairs            Wheelchair Mobility    Modified Rankin (Stroke Patients Only)       Balance                                             Pertinent Vitals/Pain Pain Assessment: Faces Faces Pain Scale: Hurts even more Pain Location: generalized when she is touched or moved Pain Descriptors / Indicators: Moaning;Guarding    Home Living Family/patient expects to be discharged to:: Private residence Living Arrangements: Children Available Help at Discharge: Family             Additional Comments: per  CM, to return home with family providing care    Prior Function     Gait / Transfers Assistance Needed: bed bound by the presentation           Hand Dominance        Extremity/Trunk Assessment             Cervical / Trunk Assessment Cervical / Trunk Assessment: Other exceptions Cervical / Trunk Exceptions: lying in fetal position, legs are contracted, voluntary LUE movement  Communication   Communication: (moans out)  Cognition Arousal/Alertness: Lethargic Behavior During Therapy: Agitated Overall Cognitive Status: No family/caregiver present to determine baseline cognitive functioning                                 General Comments: pt. does not follow  any verbal commands, withdraws limbs when touched      General Comments      Exercises     Assessment/Plan    PT Assessment Patent does not need any further PT services  PT Problem List         PT Treatment Interventions      PT Goals (Current goals can be found in the Care Plan section)  Acute Rehab PT  Goals PT Goal Formulation: Patient unable to participate in goal setting    Frequency     Barriers to discharge        Co-evaluation               AM-PAC PT "6 Clicks" Mobility  Outcome Measure                  End of Session   Activity Tolerance: Treatment limited secondary to agitation Patient left: in bed;with call bell/phone within reach Nurse Communication: Mobility status      Time: [id number] PT Time Calculation (min) (ACUTE ONLY): 20 min   Charges:   PT Evaluation $PT Eval Low Complexity: [place] PT Acute Rehabilitation Services Pager [phone] Office [phone]   Claretha Cooper 04/05/2019, 1:05 PM

## 2019-04-05 NOTE — NC FL2 (Signed)
[place] LEVEL OF CARE SCREENING TOOL     IDENTIFICATION  Patient Name: Angela [name] Birthdate: 26-Aug-1918 Sex: female Admission Date (Current Location): 03/30/2019  [facility] and [place] Number:  Herbalist and Address:  [facility],  [place], [place]      Provider Number: [id number]  Attending Physician Name and Address:  Eugenie Filler, MD  Relative Name and Phone Number:       Current Level of Care: Hospital Recommended Level of Care: [place] Prior Approval Number:    Date Approved/Denied:   PASRR Number: [id number]  Discharge Plan: SNF    Current Diagnoses: Patient Active Problem List   Diagnosis Date Noted  . Hypothyroidism   . Hypernatremia   . Pulmonary embolus ([place])   . Pneumonia due to COVID-19 virus 03/31/2019  . Acute respiratory failure with hypoxia ([place]) 03/31/2019  . Acute diastolic CHF (congestive heart failure) ([place]) 03/31/2019  . AKI (acute kidney injury) ([place]) 03/31/2019  . Acute respiratory failure ([place]) 03/31/2019  . Sepsis ([place]) 03/30/2019  . Pressure injury of skin 03/10/2019  . Generalized weakness 03/08/2019  . Acute lower UTI 03/08/2019  . Multifocal pneumonia 03/08/2019  . Abnormal TSH 01/28/2019  . Hypokalemia 01/28/2019  . Vitamin D deficiency 01/28/2019  . Gait disorder 07/18/2017  . Bilateral hearing loss 04/28/2017  . Acquired deformity of toenail 04/28/2017  . Nocturia 04/28/2017  . Encounter for well adult exam with abnormal findings 01/19/2017  . Neuralgia, postherpetic 05/27/2016  . Constipation 05/05/2016  . Shingles 05/05/2016  . OAB (overactive bladder) 05/05/2016  . Contusion 02/26/2016  . Impacted cerumen of both ears 09/25/2015  . Neutropenia ([place]) 09/25/2015  . Bilateral knee contractures 10/26/2013  . Sinusitis, acute 10/26/2013  . Dysuria 04/20/2012  . Other malaise and fatigue 04/20/2012  . Decubitus ulcers 08/10/2011  . Vision  loss of left eye 04/06/2011  . Atrophic vaginitis 09/12/2010  . CHOLELITHIASIS, SYMPTOMATIC 10/06/2007  . HLD (hyperlipidemia) 06/16/2007  . ANEMIA-NOS 06/16/2007  . Anxiety state 06/16/2007  . Depression 06/16/2007  . SKIN LESIONS, MULTIPLE 06/16/2007  . ADENOCARCINOMA, BREAST 11/12/2006  . MASTECTOMY, LEFT, HX OF 11/12/2006  . Allergic rhinitis 10/05/2006  . Rheumatoid arthritis ([place]) 10/05/2006    Orientation RESPIRATION BLADDER Height & Weight     Self  Normal Incontinent Weight: 40.8 kg Height:  5\' 3"  (160 cm)  BEHAVIORAL SYMPTOMS/MOOD NEUROLOGICAL BOWEL NUTRITION STATUS      Incontinent Diet(Heart Healthy)  AMBULATORY STATUS COMMUNICATION OF NEEDS Skin   Total Care Non-Verbally Other (Comment)(Stage 2 x 4 buttocks,)                       Personal Care Assistance Level of Assistance  Total care Bathing Assistance: Maximum assistance Feeding assistance: Maximum assistance Dressing Assistance: Maximum assistance Total Care Assistance: Maximum assistance   Functional Limitations Info  Sight, Hearing, Speech Sight Info: Impaired Hearing Info: Impaired Speech Info: Impaired    SPECIAL CARE FACTORS FREQUENCY        PT Frequency: x5 OT Frequency: x5            Contractures Contractures Info: Present    Additional Factors Info  Code Status, Allergies Code Status Info: DNR Allergies Info: Penicillins           Current Medications (04/05/2019):  This is the current hospital active medication list Current Facility-Administered Medications  Medication Dose Route Frequency Provider Last Rate Last Admin  .  acetaminophen (TYLENOL) tablet 650 mg  650 mg Oral Q6H PRN Shela Leff, MD       Or  . acetaminophen (TYLENOL) suppository 650 mg  650 mg Rectal Q6H PRN Shela Leff, MD      . ascorbic acid (VITAMIN C) tablet 500 mg  500 mg Oral Daily Shela Leff, MD   500 mg at 04/03/19 1039  . benzonatate (TESSALON) capsule 100 mg  100 mg Oral TID  PRN Shela Leff, MD      . ceFEPIme (MAXIPIME) 2 g in sodium chloride 0.9 % 100 mL IVPB  2 g Intravenous Q24H BellSharyn Lull T, RPH 200 mL/hr at 04/04/19 1700 2 g at 04/04/19 1700  . citalopram (CELEXA) tablet 10 mg  10 mg Oral Daily Rai, Ripudeep K, MD   10 mg at 04/03/19 1048  . dexamethasone (DECADRON) injection 6 mg  6 mg Intravenous Q24H Shela Leff, MD   6 mg at 04/04/19 2226  . dextrose 5 % solution   Intravenous Continuous Eugenie Filler, MD 50 mL/hr at 04/05/19 0600 New Bag at 04/05/19 0600  . enoxaparin (LOVENOX) injection 40 mg  40 mg Subcutaneous Q24H Leodis Sias T, RPH   40 mg at 04/05/19 1330  . feeding supplement (ENSURE ENLIVE) (ENSURE ENLIVE) liquid 237 mL  237 mL Oral BID BM Rai, Ripudeep K, MD   237 mL at 04/05/19 0900  . fluticasone (FLONASE) 50 MCG/ACT nasal spray 2 spray  2 spray Each Nare Daily PRN Shela Leff, MD      . levothyroxine (SYNTHROID, LEVOTHROID) injection 12.5 mcg  12.5 mcg Intravenous Daily Eugenie Filler, MD   12.5 mcg at 04/05/19 0859  . metoprolol tartrate (LOPRESSOR) injection 2.5 mg  2.5 mg Intravenous Q8H Eugenie Filler, MD   2.5 mg at 04/05/19 1343  . metoprolol tartrate (LOPRESSOR) injection 5 mg  5 mg Intravenous Q6H PRN Rai, Ripudeep K, MD   5 mg at 04/03/19 1443  . multivitamin with minerals tablet 1 tablet  1 tablet Oral Daily Rai, Ripudeep K, MD   1 tablet at 04/03/19 1039  . ondansetron (ZOFRAN) injection 4 mg  4 mg Intravenous Q6H PRN Rai, Ripudeep K, MD   4 mg at 04/03/19 0555  . zinc sulfate capsule 220 mg  220 mg Oral Daily Shela Leff, MD   220 mg at 04/03/19 1039     Discharge Medications: Please see discharge summary for a list of discharge medications.  Relevant Imaging Results:  Relevant Lab Results:   Additional Information [id number]  Purcell Mouton, RN

## 2019-04-06 LAB — BASIC METABOLIC PANEL
Anion gap: 8 (ref 5–15)
BUN: 39 mg/dL — ABNORMAL HIGH (ref 8–23)
CO2: 27 mmol/L (ref 22–32)
Calcium: 8.1 mg/dL — ABNORMAL LOW (ref 8.9–10.3)
Chloride: 108 mmol/L (ref 98–111)
Creatinine, Ser: 0.92 mg/dL (ref 0.44–1.00)
GFR calc Af Amer: 60 mL/min — ABNORMAL LOW (ref >60)
GFR calc non Af Amer: 51 mL/min — ABNORMAL LOW (ref >60)
Glucose, Bld: 133 mg/dL — ABNORMAL HIGH (ref 70–99)
Potassium: 3.5 mmol/L (ref 3.5–5.1)
Sodium: 143 mmol/L (ref 135–145)

## 2019-04-06 LAB — CBC
HCT: 28.4 % — ABNORMAL LOW (ref 36.0–46.0)
Hemoglobin: 8.4 g/dL — ABNORMAL LOW (ref 12.0–15.0)
MCH: 26.5 pg (ref 26.0–34.0)
MCHC: 29.6 g/dL — ABNORMAL LOW (ref 30.0–36.0)
MCV: 89.6 fL (ref 80.0–100.0)
Platelets: 930 10*3/uL (ref 150–400)
RBC: 3.17 MIL/uL — ABNORMAL LOW (ref 3.87–5.11)
RDW: 22.2 % — ABNORMAL HIGH (ref 11.5–15.5)
WBC: 1.7 10*3/uL — ABNORMAL LOW (ref 4.0–10.5)
nRBC: 0 % (ref 0.0–0.2)

## 2019-04-06 MED ORDER — POTASSIUM CHLORIDE 10 MEQ/100ML IV SOLN
10.0000 meq | INTRAVENOUS | Status: AC
  Administered 2019-04-06 (×2): 10 meq via INTRAVENOUS
  Filled 2019-04-06 (×2): qty 100

## 2019-04-06 NOTE — Progress Notes (Signed)
Nurse and NT spent time with  Pt (45 min ) providing pericare  and changing dressings. Pt grunted,  Moaned and was somewhat combative  during care. At [place] notified nurse that pt's HR was dipping down into the hi 40's. Pt is a DNR. On call updated. Will continue to monitor.

## 2019-04-06 NOTE — Progress Notes (Signed)
PROGRESS NOTE    Angela [name]  ZWC:[id number] DOB: March 08, 1918 DOA: 03/30/2019 PCP: [name], MD    Brief Narrative:  Patient is a 86 year old female with history of breast CA status post mastectomy, anemia, anxiety, rheumatoid arthritis, hypothyroidism, recent hospital admission 3/4-3/10/2019 (secondary to multifocal pneumonia, hyponatremia, AKI, acute diastolic CHF, AVNRT) presented to ED with abdominal pain, fevers, 103.1 F, hypotensive BP 88/60, tachycardia 130, tachypnea with respiratory rate of 40.  O2 sats 98% on room air Patient was also found to have wound in the buttocks, sacral region Chest x-ray showed moderate to marked severity left basilar atelectasis and/or infiltrates.  Patient was placed on vancomycin, cefepime, metronidazole. COVID-19 positive   Assessment & Plan:   Principal Problem:   Pneumonia due to COVID-19 virus Active Problems:   Sepsis ([place])   Acute respiratory failure with hypoxia (HCC)   Acute diastolic CHF (congestive heart failure) (HCC)   AKI (acute kidney injury) ([place])   Acute respiratory failure (HCC)   Hypothyroidism   Hypernatremia   Pulmonary embolus (HCC)  1 acute hypoxemic respiratory failure due to acute COVID-19 viral pneumonia, POA/acute PE/sepsis secondary to HCAP Patient presented with systemic inflammatory response syndrome with hypotension, tachycardia tachypnea, COVID-19 was positive.  Chest x-ray showed moderate to marked severe left base atelectasis/infiltrates.  COVID-19 was positive.  CT angiogram chest showed a small pulmonary embolus in the right lower lobe in the segmental pulmonary artery with right heart strain.  Patient pancultured with cultures pending with no growth to date..  Inflammatory markers of D-dimer, CRP, ferritin trending down.  Patient currently afebrile.  Sats of 99 % on room air.  Continue Decadron.  Status post 5 days of IV remdesivir.  Continue full dose Lovenox.  Status post 7 days IV cefepime. Supportive  care.  2.  Pulmonary embolism Per CT angiogram chest that showed an acute right lower lobe PE with possible right heart strain.  Unable to get lower extremity Dopplers due to concern for contractures.  2D echo with a EF of 55 to 60%, mildly reduced right ventricular systolic function, moderately elevated PA pressure.  Patient initially placed on IV heparin has been transitioned to full dose Lovenox.  Follow.  3.  Acute diastolic CHF exacerbation 2D echo done from 03/09/2019 with normal LV function, grade 1 diastolic dysfunction.  Concern that patient volume overloaded on admission with significant bilateral lower extremity edema and JVD.  CT angiogram chest done showed a PE with possible right heart strain.  Volume overload improved.  Patient with borderline blood pressure and tachycardia and as such diuretics were held. Patient received a 500 cc bolus of normal saline.  Oral Lopressor has been changed to IV Lopressor as patient noted to be refusing and pocketing medications.  Monitor for now.  4.  Acute kidney injury Felt secondary to possible acute CHF, hypotension with decreased renal perfusion and COVID-19 pneumonia.  Creatinine currently at 0.92.  Follow.  5.  Hypernatremia Likely secondary to poor oral intake.  Patient noted to be on diuretics which have been discontinued.  Improved on gentle hydration with D5W.  Likely discontinue IV fluids tomorrow.   6.  Hypokalemia Potassium at 3.5.  Replete.   7.  Hypothyroidism TSH was 12.9. It was noted per admitting physician that patient had not taken her thyroid medication over a month.  Continue current dose of Synthroid.  Will need repeat thyroid function studies done in about 4 to 6 weeks in the outpatient setting.  8.  Injury  documentation Right hip lateral stage II/mid sacrum stage II, medial aspect of left knee Continue current wound care recommendations.  9.  Severe protein calorie malnutrition/probable underlying dementia Patient with  severe protein calorie malnutrition.  Patient per RN pocketing pills and refusing oral intake.  Could likely be secondary to acute infection with probable underlying dementia.  Supportive care.  10.  Goals of care Patient with advanced age with multiple comorbidities, now with COVID-19 pneumonia.  Palliative care was consulted and decision was made to continue aggressive care at this time.  Will likely need palliative care to follow-up in the outpatient setting once patient is discharged.   DVT prophylaxis: Heparin Code Status: DNR Family Communication: No family at bedside. Disposition Plan:  . Patient came from: Home            . Anticipated d/c place: To be determined.  Likely back home versus SNF. Marland Kitchen Barriers to d/c OR conditions which need to be met to effect a safe d/c: Improvement with sepsis, pneumonia with concern for questionable CHF, COVID-19 respiratory failure.   Consultants:   Palliative care: Dr. Domingo Cocking 04/01/2019  Procedures:   CT angiogram chest 03/31/2019  CT abdomen and pelvis 03/30/2019  2D echo 04/01/2019    Antimicrobials  IV cefepime 03/30/2019>>>>> 04/05/2019  IV Flagyl 3/26/2021x1 dose  IV vancomycin 3/26/2021x1 dose   Subjective: Patient sleeping but arousable.  Seems more alert today.  When asked whether any chest pain she motions no, when asked when any shortness of breath she motions no.  Minimally verbal.   Objective: Vitals:   04/05/19 1327 04/05/19 1800 04/05/19 2331 04/06/19 0525  BP: (!) 111/55 (!) 101/58 121/66 (!) 142/66  Pulse: 67 60 60 62  Resp: 14  20 18   Temp: (!) 97.1 F (36.2 C) (!) 97.2 F (36.2 C) (!) 97.5 F (36.4 C) (!) 97.3 F (36.3 C)  TempSrc: Axillary Rectal Rectal Rectal  SpO2: 100% 99% 98% 99%  Weight:      Height:        Intake/Output Summary (Last 24 hours) at 04/06/2019 1244 Last data filed at 04/06/2019 0600 Gross per 24 hour  Intake 981.95 ml  Output --  Net 981.95 ml   Filed Weights   03/30/19 1757   Weight: 40.8 kg    Examination:  General exam: Cachectic.  Frail Respiratory system: Lungs clear to auscultation bilaterally.  No wheezes, no crackles, no rhonchi.  Decreased breath sounds in the bases.  No use of accessory muscles of respiration. Cardiovascular system: RRR no murmurs rubs or gallops.  No JVD.  No lower extremity edema.  Gastrointestinal system: Abdomen is nontender, nondistended, soft, positive bowel sounds.  No rebound.  No guarding.  Central nervous system: Alert. No focal neurological deficits. Extremities: No clubbing cyanosis or edema.  Moving extremities spontaneously.  Skin: Sacral, right hip, medial aspect of left knee wounds.  Psychiatry: Judgement and insight appear poor-fair. Mood & affect appropriate.             Data Reviewed: I have personally reviewed following labs and imaging studies  CBC: Recent Labs  Lab 03/30/19 1730 03/31/19 0555 04/02/19 0422 04/03/19 0459 04/04/19 0415 04/05/19 0355 04/06/19 0423  WBC 2.9*   < > 1.9*  1.8* 1.9* 2.2* 2.0* 1.7*  NEUTROABS 1.7  --  1.1* 1.4* 1.1* 1.5*  --   HGB 10.0*   < > 12.7  12.5 9.9* 8.6* 8.5* 8.4*  HCT 34.7*   < > 44.2  42.9 33.4* 29.6*  29.5* 28.4*  MCV 93.5   < > 94.0  92.3 90.3 92.2 91.0 89.6  PLT 674*   < > 759*  745* 787* 830* 835* 930*   < > = values in this interval not displayed.   Basic Metabolic Panel: Recent Labs  Lab 04/02/19 0422 04/03/19 0459 04/04/19 0415 04/05/19 0355 04/06/19 0423  NA 146* 146* 147* 144 143  K 4.0 3.7 3.4* 3.8 3.5  CL 108 107 110 109 108  CO2 23 27 27 25 27   GLUCOSE 75 118* 72 126* 133*  BUN 31* 40* 48* 46* 39*  CREATININE 1.05* 1.20* 1.19* 1.16* 0.92  CALCIUM 8.0* 7.9* 7.7* 7.7* 8.1*   GFR: Estimated Creatinine Clearance: 21.5 mL/min (by C-G formula based on SCr of 0.92 mg/dL). Liver Function Tests: Recent Labs  Lab 03/30/19 1730 04/01/19 0826  AST 32 53*  ALT 18 25  ALKPHOS 66 156*  BILITOT 0.4 0.5  PROT 6.6 6.2*  ALBUMIN 2.6*  2.2*   No results for input(s): LIPASE, AMYLASE in the last 168 hours. No results for input(s): AMMONIA in the last 168 hours. Coagulation Profile: Recent Labs  Lab 03/30/19 1730  INR 1.2   Cardiac Enzymes: No results for input(s): CKTOTAL, CKMB, CKMBINDEX, TROPONINI in the last 168 hours. BNP (last 3 results) No results for input(s): PROBNP in the last 8760 hours. HbA1C: No results for input(s): HGBA1C in the last 72 hours. CBG: Recent Labs  Lab 04/03/19 1605  GLUCAP 133*   Lipid Profile: No results for input(s): CHOL, HDL, LDLCALC, TRIG, CHOLHDL, LDLDIRECT in the last 72 hours. Thyroid Function Tests: Recent Labs    04/03/19 1711  FREET4 0.84   Anemia Panel: Recent Labs    04/04/19 0415 04/05/19 0355  FERRITIN 340* 371*   Sepsis Labs: Recent Labs  Lab 03/30/19 1730 03/30/19 1931 04/02/19 0422 04/03/19 0459  PROCALCITON 0.23  --  0.61 0.66  LATICACIDVEN 3.1* 1.9  --   --     Recent Results (from the past 240 hour(s))  Culture, blood (Routine x 2)     Status: None   Collection Time: 03/30/19  5:30 PM   Specimen: BLOOD  Result Value Ref Range Status   Specimen Description   Final    BLOOD RIGHT ANTECUBITAL Performed at [facility], [place] [address], [place], [place] [address]    Special Requests   Final    BOTTLES DRAWN AEROBIC AND ANAEROBIC Blood Culture results may not be optimal due to an excessive volume of blood received in culture bottles Performed at [facility] [address] [place], [place] [address]    Culture   Final    NO GROWTH 5 DAYS Performed at [facility], [place] [address], [place], [place] [address]    Report Status 04/04/2019 FINAL  Final  Culture, blood (Routine x 2)     Status: None   Collection Time: 03/30/19  5:40 PM   Specimen: BLOOD  Result Value Ref Range Status   Specimen Description   Final    BLOOD LEFT ANTECUBITAL Performed at [facility]  [address] [place], [place] [address]    Special Requests   Final    BOTTLES DRAWN AEROBIC AND ANAEROBIC Blood Culture results may not be optimal due to an excessive volume of blood received in culture bottles Performed at [facility] [address] [place], [place] [address]    Culture   Final  NO GROWTH 5 DAYS Performed at [facility], [place] [address], [place], [place] [address]    Report Status 04/04/2019 FINAL  Final  Urine culture     Status: None   Collection Time: 03/30/19  6:01 PM   Specimen: In/Out Cath Urine  Result Value Ref Range Status   Specimen Description   Final    IN/OUT CATH URINE Performed at [facility] [address] [place], [place] [address]    Special Requests   Final    NONE Performed at [facility], [place] [address] [place], [place] [address]    Culture   Final    NO GROWTH Performed at [facility], [place] [address], [place], [place] [address]    Report Status 04/01/2019 FINAL  Final  Respiratory Panel by RT PCR (Flu A&B, Covid) - Nasopharyngeal Swab     Status: Abnormal   Collection Time: 03/30/19  7:31 PM   Specimen: Nasopharyngeal Swab  Result Value Ref Range Status   SARS Coronavirus 2 by RT PCR POSITIVE (A) NEGATIVE Final    Comment: RESULT CALLED TO, READ BACK BY AND VERIFIED WITH: N,MOSCINSKI AT 2306 ON 03/30/19 BY A,MOHAMED (NOTE) SARS-CoV-2 target nucleic acids are DETECTED. SARS-CoV-2 RNA is generally detectable in upper respiratory specimens  during the acute phase of infection. Positive results are indicative of the presence of the identified virus, but do not rule out bacterial infection or co-infection with other pathogens not detected by the test. Clinical correlation with patient history and other diagnostic information is necessary to determine patient infection status. The expected result is Negative. Fact Sheet for Patients:    PinkCheek.be Fact Sheet for Healthcare Providers: GravelBags.it This test is not yet approved or cleared by the [place] FDA and  has been authorized for detection and/or diagnosis of SARS-CoV-2 by FDA under an Emergency Use Authorization (EUA).  This EUA will remain in effect (meaning this test can be  used) for the duration of  the COVID-19 declaration under Section 564(b)(1) of the Act, 21 U.S.C. section 360bbb-3(b)(1), unless the authorization is terminated or revoked sooner.    Influenza A by PCR NEGATIVE NEGATIVE Final   Influenza B by PCR NEGATIVE NEGATIVE Final    Comment: (NOTE) The Xpert Xpress SARS-CoV-2/FLU/RSV assay is intended as an aid in  the diagnosis of influenza from Nasopharyngeal swab specimens and  should not be used as a sole basis for treatment. Nasal washings and  aspirates are unacceptable for Xpert Xpress SARS-CoV-2/FLU/RSV  testing. Fact Sheet for Patients: PinkCheek.be Fact Sheet for Healthcare Providers: GravelBags.it This test is not yet approved or cleared by the [place] FDA and  has been authorized for detection and/or diagnosis of SARS-CoV-2 by  FDA under an Emergency Use Authorization (EUA). This EUA will remain  in effect (meaning this test can be used) for the duration of the  Covid-19 declaration under Section 564(b)(1) of the Act, 21  U.S.C. section 360bbb-3(b)(1), unless the authorization is  terminated or revoked. Performed at [facility], [place] [address] [place], [place] [address]   MRSA PCR Screening     Status: None   Collection Time: 04/01/19  9:36 PM   Specimen: Nasal Mucosa; Nasopharyngeal  Result Value Ref Range Status   MRSA by PCR NEGATIVE NEGATIVE Final    Comment:        The GeneXpert MRSA Assay (FDA approved for NASAL specimens only), is one component of a comprehensive MRSA  colonization surveillance program. It is not intended to diagnose MRSA infection nor to guide or monitor treatment for MRSA infections. Performed at [facility], [place] [address], [place], [place] [address]   Culture, blood (Routine X 2) w Reflex to ID Panel     Status: None (Preliminary result)   Collection Time: 04/03/19  3:52 PM   Specimen: BLOOD LEFT FOREARM  Result Value Ref Range Status   Specimen Description   Final    BLOOD LEFT FOREARM Performed at [facility] [address], [place], [place] [address]    Special Requests   Final    BOTTLES DRAWN AEROBIC ONLY Blood Culture adequate volume Performed at [facility] [address], [place], [place] [address]    Culture   Final    NO GROWTH 2 DAYS Performed at [facility] [address], [place], [place] [address]    Report Status PENDING  Incomplete  Culture, blood (Routine X 2) w Reflex to ID Panel     Status: None (Preliminary result)   Collection Time: 04/03/19  5:11 PM   Specimen: BLOOD LEFT ARM  Result Value Ref Range Status   Specimen Description   Final    BLOOD LEFT ARM Performed at [facility] [address] [place], [place] [address]    Special Requests   Final    BOTTLES DRAWN AEROBIC ONLY Blood Culture results may not be optimal due to an inadequate volume of blood received in culture bottles Performed at [facility] [address], [place], [place] [address]    Culture   Final    NO GROWTH 2 DAYS Performed at [facility] [address], [place], [place] [address]    Report Status PENDING  Incomplete         Radiology Studies: No results found.      Scheduled Meds: . vitamin C  500 mg Oral Daily  . citalopram  10 mg Oral Daily  . dexamethasone (DECADRON) injection  6 mg Intravenous Q24H  . enoxaparin (LOVENOX) injection  40 mg Subcutaneous Q24H  . feeding supplement  (ENSURE ENLIVE)  237 mL Oral BID BM  . levothyroxine  12.5 mcg Intravenous Daily  . metoprolol tartrate  2.5 mg Intravenous Q8H  . multivitamin with minerals  1 tablet Oral Daily  . zinc sulfate  220 mg Oral Daily   Continuous Infusions: . potassium chloride 10 mEq (04/06/19 1154)     LOS: 7 days    Time spent: 35 minutes    Irine Seal, MD [facility]   To contact the attending provider between 7A-7P or the covering provider during after hours 7P-7A, please log into the web site [email or web address] and access using universal [place] password for that web site. If you do not have the password, please call the hospital operator.  04/06/2019, 12:44 PM

## 2019-04-07 ENCOUNTER — Inpatient Hospital Stay (HOSPITAL_COMMUNITY): Payer: Medicare Other

## 2019-04-07 DIAGNOSIS — T68XXXD Hypothermia, subsequent encounter: Secondary | ICD-10-CM

## 2019-04-07 DIAGNOSIS — D709 Neutropenia, unspecified: Secondary | ICD-10-CM

## 2019-04-07 DIAGNOSIS — T68XXXA Hypothermia, initial encounter: Secondary | ICD-10-CM

## 2019-04-07 DIAGNOSIS — D473 Essential (hemorrhagic) thrombocythemia: Secondary | ICD-10-CM

## 2019-04-07 DIAGNOSIS — I2693 Single subsegmental pulmonary embolism without acute cor pulmonale: Secondary | ICD-10-CM

## 2019-04-07 DIAGNOSIS — D75839 Thrombocytosis, unspecified: Secondary | ICD-10-CM

## 2019-04-07 LAB — BASIC METABOLIC PANEL
Anion gap: 8 (ref 5–15)
BUN: 29 mg/dL — ABNORMAL HIGH (ref 8–23)
CO2: 24 mmol/L (ref 22–32)
Calcium: 8 mg/dL — ABNORMAL LOW (ref 8.9–10.3)
Chloride: 110 mmol/L (ref 98–111)
Creatinine, Ser: 0.8 mg/dL (ref 0.44–1.00)
GFR calc Af Amer: >60 mL/min (ref >60)
GFR calc non Af Amer: >60 mL/min (ref >60)
Glucose, Bld: 83 mg/dL (ref 70–99)
Potassium: 3.9 mmol/L (ref 3.5–5.1)
Sodium: 142 mmol/L (ref 135–145)

## 2019-04-07 LAB — URINALYSIS, ROUTINE W REFLEX MICROSCOPIC
Bacteria, UA: NONE SEEN
Bilirubin Urine: NEGATIVE
Glucose, UA: NEGATIVE mg/dL
Ketones, ur: NEGATIVE mg/dL
Leukocytes,Ua: NEGATIVE
Nitrite: NEGATIVE
Protein, ur: 100 mg/dL — AB
Specific Gravity, Urine: 1.015 (ref 1.005–1.030)
pH: 7 (ref 5.0–8.0)

## 2019-04-07 LAB — CBC WITH DIFFERENTIAL/PLATELET
Abs Immature Granulocytes: 0.07 10*3/uL (ref 0.00–0.07)
Basophils Absolute: 0 10*3/uL (ref 0.0–0.1)
Basophils Relative: 1 %
Eosinophils Absolute: 0 10*3/uL (ref 0.0–0.5)
Eosinophils Relative: 0 %
HCT: 29.5 % — ABNORMAL LOW (ref 36.0–46.0)
Hemoglobin: 8.6 g/dL — ABNORMAL LOW (ref 12.0–15.0)
Immature Granulocytes: 5 %
Lymphocytes Relative: 25 %
Lymphs Abs: 0.4 10*3/uL — ABNORMAL LOW (ref 0.7–4.0)
MCH: 26.8 pg (ref 26.0–34.0)
MCHC: 29.2 g/dL — ABNORMAL LOW (ref 30.0–36.0)
MCV: 91.9 fL (ref 80.0–100.0)
Monocytes Absolute: 0.2 10*3/uL (ref 0.1–1.0)
Monocytes Relative: 11 %
Neutro Abs: 0.9 10*3/uL — ABNORMAL LOW (ref 1.7–7.7)
Neutrophils Relative %: 58 %
Platelets: 1048 10*3/uL (ref 150–400)
RBC: 3.21 MIL/uL — ABNORMAL LOW (ref 3.87–5.11)
RDW: 22.3 % — ABNORMAL HIGH (ref 11.5–15.5)
WBC: 1.5 10*3/uL — ABNORMAL LOW (ref 4.0–10.5)
nRBC: 0 % (ref 0.0–0.2)

## 2019-04-07 LAB — LACTIC ACID, PLASMA: Lactic Acid, Venous: 1.1 mmol/L (ref 0.5–1.9)

## 2019-04-07 LAB — PROCALCITONIN: Procalcitonin: <0.1 ng/mL

## 2019-04-07 MED ORDER — LEVOTHYROXINE SODIUM 100 MCG/5ML IV SOLN
25.0000 ug | Freq: Every day | INTRAVENOUS | Status: DC
  Administered 2019-04-08 – 2019-04-13 (×6): 25 ug via INTRAVENOUS
  Filled 2019-04-07 (×6): qty 5

## 2019-04-07 MED ORDER — LEVOTHYROXINE SODIUM 100 MCG/5ML IV SOLN
12.5000 ug | Freq: Once | INTRAVENOUS | Status: AC
  Administered 2019-04-07: 15:00:00 12.5 ug via INTRAVENOUS
  Filled 2019-04-07: qty 5

## 2019-04-07 MED ORDER — METOPROLOL TARTRATE 5 MG/5ML IV SOLN: 2.5000 mg | Freq: Two times a day (BID) | INTRAVENOUS | Status: DC

## 2019-04-07 NOTE — Evaluation (Signed)
Occupational Therapy Evaluation and Discharge from OT Patient Details Name: Angela [name] MRN: [id number] DOB: 1918-07-17 Today's Date: 04/07/2019    History of Present Illness Patient is a 86 year old female with history of breast CA status post mastectomy, anemia, anxiety, rheumatoid arthritis, hypothyroidism, recent hospital admission 3/4-3/10/2019 (secondary to multifocal pneumonia, hyponatremia, AKI, acute diastolic CHF, AVNRT) presented to ED with abdominal pain, fevers, 103.1 F, hypotensive BP 88/60, tachycardia 130, tachypnea   Clinical Impression   Per chart review Pt was oriented to self, and able to perform some ADL from [facility] level - living at home with family. Today Pt is unable to arouse despite attempts with music, sternal rub, wet wash rags (washing face), calling out her name, rolling at bed level. She withdraws to touch. Pt is total A for all aspects of ADL at this time. OT will sign off at this time; should Pt status change and Pt becomes arousable, OT is MORE Than happy to work with patient on functional goals working towards participation in ADL and meaningful occupations.     Follow Up Recommendations  SNF;Other (comment)(per chart review Pt family wishes to take her home)    Equipment Recommendations  Other (comment);Hospital bed(air mattress hospital bed)    Recommendations for Other Services       Precautions / Restrictions Precautions Precautions: Fall;Other (comment) Precaution Comments: Has bed sores. Restrictions Weight Bearing Restrictions: No      Mobility Bed Mobility Overal bed mobility: Needs Assistance Bed Mobility: Rolling Rolling: Total assist;+2 for safety/equipment         General bed mobility comments: total assist to roll, did not attempt sitting as patient was withdrawing and moaning  Transfers                 General transfer comment: NT this session    Balance                                            ADL either performed or assessed with clinical judgement   ADL Overall ADL's : Needs assistance/impaired                                       General ADL Comments: currently total A for all aspects of ADL     Vision         Perception     Praxis      Pertinent Vitals/Pain Pain Assessment: Faces Faces Pain Scale: Hurts even more Pain Location: generalized when she is touched or moved Pain Descriptors / Indicators: Moaning;Guarding Pain Intervention(s): Limited activity within patient's tolerance     Hand Dominance     Extremity/Trunk Assessment             Communication Communication Communication: Other (comment)(groans only)   Cognition Arousal/Alertness: Lethargic Behavior During Therapy: Flat affect Overall Cognitive Status: No family/caregiver present to determine baseline cognitive functioning                                 General Comments: pt. does not follow  any verbal commands, withdraws limbs when touched   General Comments  attempted to engage patient with music, wash cloths, sternal rubs - Pt only moaned and withdrew from attempts to  engage in therapy, VSS (SpO2 95%, HR 53, RR 16)    Exercises     Shoulder Instructions      Home Living Family/patient expects to be discharged to:: Private residence Living Arrangements: Children Available Help at Discharge: Family                             Additional Comments: per CM, to return home with family providing care      Prior Functioning/Environment Level of Independence: Needs assistance  Gait / Transfers Assistance Needed: per chart review, Pt was at home using WC and assist for transfer ADL's / Homemaking Assistance Needed: per chart review Pt was set up for ADL   Comments: Pt groans with deep sternal rub, very hard to arouse - PLOF gleaned through chart review        OT Problem List: Decreased strength;Decreased range of motion;Decreased  activity tolerance;Impaired balance (sitting and/or standing);Decreased cognition;Pain      OT Treatment/Interventions:      OT Goals(Current goals can be found in the care plan section) Acute Rehab OT Goals Patient Stated Goal: unable to arouse this session OT Goal Formulation: Patient unable to participate in goal setting  OT Frequency:     Barriers to D/C:            Co-evaluation              AM-PAC OT "6 Clicks" Daily Activity     Outcome Measure Help from another person eating meals?: Total Help from another person taking care of personal grooming?: Total Help from another person toileting, which includes using toliet, bedpan, or urinal?: Total Help from another person bathing (including washing, rinsing, drying)?: Total Help from another person to put on and taking off regular upper body clothing?: Total Help from another person to put on and taking off regular lower body clothing?: Total 6 Click Score: 6   End of Session Nurse Communication: Mobility status  Activity Tolerance: Patient limited by fatigue;Patient limited by lethargy Patient left: in bed;with call bell/phone within reach;with bed alarm set  OT Visit Diagnosis: Other abnormalities of gait and mobility (R26.89);Muscle weakness (generalized) (M62.81);Adult, failure to thrive (R62.7);Other symptoms and signs involving cognitive function;Pain Pain - part of body: (generalized)                Time: 1450-1459 OT Time Calculation (min): 9 min Charges:  OT General Charges $OT Visit: 1 Visit OT Evaluation $OT Eval Low Complexity: [place] OTR/L Acute Rehabilitation Services Pager: [phone] Office: [place] 04/07/2019, 3:03 PM

## 2019-04-07 NOTE — Progress Notes (Signed)
PROGRESS NOTE    Angela [name]  KYH:[id number] DOB: Jun 13, 1918 DOA: 03/30/2019 PCP: [name], MD    Brief Narrative:  Patient is a 86 year old female with history of breast CA status post mastectomy, anemia, anxiety, rheumatoid arthritis, hypothyroidism, recent hospital admission 3/4-3/10/2019 (secondary to multifocal pneumonia, hyponatremia, AKI, acute diastolic CHF, AVNRT) presented to ED with abdominal pain, fevers, 103.1 F, hypotensive BP 88/60, tachycardia 130, tachypnea with respiratory rate of 40.  O2 sats 98% on room air Patient was also found to have wound in the buttocks, sacral region Chest x-ray showed moderate to marked severity left basilar atelectasis and/or infiltrates.  Patient was placed on vancomycin, cefepime, metronidazole. COVID-19 positive   Assessment & Plan:   Principal Problem:   Pneumonia due to COVID-19 virus Active Problems:   Sepsis ([place])   Acute respiratory failure with hypoxia (HCC)   Acute diastolic CHF (congestive heart failure) (HCC)   AKI (acute kidney injury) ([place])   Acute respiratory failure (HCC)   Hypothyroidism   Hypernatremia   Pulmonary embolus (HCC)   Thrombocytosis (HCC)   Hypothermia  1 acute hypoxemic respiratory failure due to acute COVID-19 viral pneumonia, POA/acute PE/sepsis secondary to HCAP Patient presented with systemic inflammatory response syndrome with hypotension, tachycardia tachypnea, COVID-19 was positive.  Chest x-ray showed moderate to marked severe left base atelectasis/infiltrates.  COVID-19 was positive.  CT angiogram chest showed a small pulmonary embolus in the right lower lobe in the segmental pulmonary artery with right heart strain.  Patient pancultured with cultures pending with no growth to date..  Inflammatory markers of D-dimer, CRP, ferritin trending down.  Patient currently afebrile.  Sats of 99 % on room air.  Continue Decadron.  Status post 5 days of IV remdesivir.  Continue full dose Lovenox.   Status post 7 days IV cefepime. Supportive care.  2.  Hypothermia Patient noted to be hypothermic early on this morning.  Patient minimally responsive.  Will panculture.  Check a chest x-ray.  Check a procalcitonin.  Check a lactic acid level.  We will give extra dose of Synthroid 12.5 MCG's IV x1.  Increase patient's Synthroid to 25 MCG's IV daily.  Warm blankets.  Supportive care.  3.  Pulmonary embolism Per CT angiogram chest that showed an acute right lower lobe PE with possible right heart strain.  Unable to get lower extremity Dopplers due to concern for contractures.  2D echo with a EF of 55 to 60%, mildly reduced right ventricular systolic function, moderately elevated PA pressure.  Patient initially placed on IV heparin has been transitioned to full dose Lovenox.  Follow.  4.  Acute diastolic CHF exacerbation 2D echo done from 03/09/2019 with normal LV function, grade 1 diastolic dysfunction.  Concern that patient volume overloaded on admission with significant bilateral lower extremity edema and JVD.  CT angiogram chest done showed a PE with possible right heart strain.  Volume overload improved.  Patient with borderline blood pressure and tachycardia and as such diuretics were held. Patient received a 500 cc bolus of normal saline.  Oral Lopressor has been changed to IV Lopressor as patient noted to be refusing and pocketing medications.  Due to bradycardia will discontinue IV Lopressor.  Monitor for now.  #5 neutropenia/thrombocytosis May be secondary to acute illness and acute phase reactant.  Will panculture as patient also noted to be hypothermic.  Check a lactic acid.  Check a procalcitonin.  Patient on full dose Lovenox due to acute PE.  Supportive care.  6.  Acute kidney injury Felt secondary to possible acute CHF, hypotension with decreased renal perfusion and COVID-19 pneumonia.  Creatinine currently at 0.8.  Follow.  7.  Hypernatremia Likely secondary to poor oral intake.   Patient noted to be on diuretics which have been discontinued.  Improved on gentle hydration with D5W.  We will continue gentle hydration as patient with significantly poor oral intake per RN.  8.  Hypokalemia Potassium at 3.9.    9.  Hypothyroidism TSH was 12.9. It was noted per admitting physician that patient had not taken her thyroid medication over a month.  Patient now hypothermic.  Will increase Synthroid to 25 MCG's IV daily.  Patient already received 12.5 mg of Synthroid IV earlier on this morning we will give an extra dose now. Will need repeat thyroid function studies done in about 4 to 6 weeks in the outpatient setting.  10.  Injury documentation Right hip lateral stage II/mid sacrum stage II, medial aspect of left knee Continue current wound care recommendations.  11.  Severe protein calorie malnutrition/probable underlying dementia Patient with severe protein calorie malnutrition.  Patient per RN pocketing pills and refusing oral intake.  Patient not eating anything.  Could likely be secondary to acute infection with probable underlying dementia.  Supportive care.  12.  Bradycardia DC IV Lopressor.  Follow.  13.  Goals of care Patient with advanced age with multiple comorbidities, now with COVID-19 pneumonia.  Palliative care was consulted and decision was made to continue aggressive care at this time.  Patient minimally responsive this morning, noted to be hypothermic, now with some neutropenia.  Patient overall with a poor prognosis.  We will reconsulted palliative care to reassess.  Will likely need palliative care to follow-up in the outpatient setting once patient is discharged.   DVT prophylaxis: Heparin Code Status: DNR Family Communication: Tried calling son, Angela [name] on the telephone however no answer and phone went to a voice mail.   Disposition Plan:  . Patient came from: Home            . Anticipated d/c place: To be determined.  Likely back home versus  SNF. Mar[place] Kitchen Barriers to d/c OR conditions which need to be met to effect a safe d/c: Improvement with sepsis, hypothermia, neutropenia, pneumonia with concern for questionable CHF, COVID-19 respiratory failure.  Improvement with mental status.   Consultants:   Palliative care: Dr. Domingo Cocking 04/01/2019  Procedures:   CT angiogram chest 03/31/2019  CT abdomen and pelvis 03/30/2019  2D echo 04/01/2019  Chest x-ray pending 04/07/2019  Antimicrobials  IV cefepime 03/30/2019>>>>> 04/05/2019  IV Flagyl 3/26/2021x1 dose  IV vancomycin 3/26/2021x1 dose   Subjective: Patient sleeping, minimally responsive opens eyes to verbal stimuli and then drifts back off.  Some grunting.  Patient noted to be hypothermic this morning per RN.  Patient not eating anything per RN.  Patient noted to be bradycardic overnight.  Objective: Vitals:   04/07/19 0800 04/07/19 1019 04/07/19 1205 04/07/19 1422  BP: (!) 135/55 (!) 153/61  127/60  Pulse: 60 (!) 55  (!) 57  Resp: _0 Temp: (!) 96.7 F (35.9 C) (!) 96.9 F (36.1 C) (!) 96.8 F (36 C) (!) 97.1 F (36.2 C)  TempSrc: Rectal Rectal Rectal Rectal  SpO2: 99% 97%  96%  Weight:      Height:        Intake/Output Summary (Last 24 hours) at 04/07/2019 1534 Last data filed at 04/07/2019 1213 Gross per 24 hour  Intake --  Output 400 ml  Net -400 ml   Filed Weights   03/30/19 1757  Weight: 40.8 kg    Examination:  General exam: Cachectic.  Frail.  Minimally responsive. Respiratory system: CTA B anterior lung fields.  No wheezes, no crackles, no rhonchi.  Decreased breath sounds in the bases.  No use of accessory muscles of respiration.  Cardiovascular system: Regular rate and rhythm no murmurs rubs or gallops.  No JVD.  No lower extremity edema. Gastrointestinal system: Abdomen is soft, nontender, nondistended, positive bowel sounds.  No rebound.  No guarding.   Central nervous system: Minimally responsive.. No focal neurological  deficits. Extremities: No clubbing cyanosis or edema.  Skin: Sacral, right hip, medial aspect of left knee wounds.  Psychiatry: Judgement and insight appear poor-fair. Mood & affect appropriate.             Data Reviewed: I have personally reviewed following labs and imaging studies  CBC: Recent Labs  Lab 04/02/19 0422 04/02/19 0422 04/03/19 0459 04/04/19 0415 04/05/19 0355 04/06/19 0423 04/07/19 0343  WBC 1.9*  1.8*   < > 1.9* 2.2* 2.0* 1.7* 1.5*  NEUTROABS 1.1*  --  1.4* 1.1* 1.5*  --  0.9*  HGB 12.7  12.5   < > 9.9* 8.6* 8.5* 8.4* 8.6*  HCT 44.2  42.9   < > 33.4* 29.6* 29.5* 28.4* 29.5*  MCV 94.0  92.3   < > 90.3 92.2 91.0 89.6 91.9  PLT 759*  745*   < > 787* 830* 835* 930* 1,048*   < > = values in this interval not displayed.   Basic Metabolic Panel: Recent Labs  Lab 04/03/19 0459 04/04/19 0415 04/05/19 0355 04/06/19 0423 04/07/19 0343  NA 146* 147* 144 143 142  K 3.7 3.4* 3.8 3.5 3.9  CL 107 110 109 108 110  CO2 _0 GLUCOSE 118* 72 126* 133* 83  BUN 40* 48* 46* 39* 29*  CREATININE 1.20* 1.19* 1.16* 0.92 0.80  CALCIUM 7.9* 7.7* 7.7* 8.1* 8.0*   GFR: Estimated Creatinine Clearance: 24.7 mL/min (by C-G formula based on SCr of 0.8 mg/dL). Liver Function Tests: Recent Labs  Lab 04/01/19 0826  AST 53*  ALT 25  ALKPHOS 156*  BILITOT 0.5  PROT 6.2*  ALBUMIN 2.2*   No results for input(s): LIPASE, AMYLASE in the last 168 hours. No results for input(s): AMMONIA in the last 168 hours. Coagulation Profile: No results for input(s): INR, PROTIME in the last 168 hours. Cardiac Enzymes: No results for input(s): CKTOTAL, CKMB, CKMBINDEX, TROPONINI in the last 168 hours. BNP (last 3 results) No results for input(s): PROBNP in the last 8760 hours. HbA1C: No results for input(s): HGBA1C in the last 72 hours. CBG: Recent Labs  Lab 04/03/19 1605  GLUCAP 133*   Lipid Profile: No results for input(s): CHOL, HDL, LDLCALC, TRIG, CHOLHDL,  LDLDIRECT in the last 72 hours. Thyroid Function Tests: No results for input(s): TSH, T4TOTAL, FREET4, T3FREE, THYROIDAB in the last 72 hours. Anemia Panel: Recent Labs    04/05/19 0355  FERRITIN 371*   Sepsis Labs: Recent Labs  Lab 04/02/19 0422 04/03/19 0459 04/07/19 1130  PROCALCITON 0.61 0.66 <0.10  LATICACIDVEN  --   --  1.1    Recent Results (from the past 240 hour(s))  Culture, blood (Routine x 2)     Status: None   Collection Time: 03/30/19  5:30 PM   Specimen: BLOOD  Result Value Ref Range Status  Specimen Description   Final    BLOOD RIGHT ANTECUBITAL Performed at [facility] [address], [place], [place] [address]    Special Requests   Final    BOTTLES DRAWN AEROBIC AND ANAEROBIC Blood Culture results may not be optimal due to an excessive volume of blood received in culture bottles Performed at [facility] [address], [place], [place] [address]    Culture   Final    NO GROWTH 5 DAYS Performed at [facility], [place] [address], [place], [place] [address]    Report Status 04/04/2019 FINAL  Final  Culture, blood (Routine x 2)     Status: None   Collection Time: 03/30/19  5:40 PM   Specimen: BLOOD  Result Value Ref Range Status   Specimen Description   Final    BLOOD LEFT ANTECUBITAL Performed at [facility] [address] [place], [place] [address]    Special Requests   Final    BOTTLES DRAWN AEROBIC AND ANAEROBIC Blood Culture results may not be optimal due to an excessive volume of blood received in culture bottles Performed at [facility] [address], [place], [place] [address]    Culture   Final    NO GROWTH 5 DAYS Performed at [facility], [place] [address], [place], [place] [address]    Report Status 04/04/2019 FINAL  Final  Urine culture     Status: None   Collection Time: 03/30/19  6:01 PM   Specimen: In/Out Cath Urine  Result Value Ref  Range Status   Specimen Description   Final    IN/OUT CATH URINE Performed at [facility] [address] [place], [place] [address]    Special Requests   Final    NONE Performed at [facility], [place] [address], [place],  [address]    Culture   Final    NO GROWTH Performed at [facility], [place] [address] [place],  [address]    Report Status 04/01/2019 FINAL  Final  Respiratory Panel by RT PCR (Flu A&B, Covid) - Nasopharyngeal Swab     Status: Abnormal   Collection Time: 03/30/19  7:31 PM   Specimen: Nasopharyngeal Swab  Result Value Ref Range Status   SARS Coronavirus 2 by RT PCR POSITIVE (A) NEGATIVE Final    Comment: RESULT CALLED TO, READ BACK BY AND VERIFIED WITH: N,MOSCINSKI AT 2306 ON 03/30/19 BY A,MOHAMED (NOTE) SARS-CoV-2 target nucleic acids are DETECTED. SARS-CoV-2 RNA is generally detectable in upper respiratory specimens  during the acute phase of infection. Positive results are indicative of the presence of the identified virus, but do not rule out bacterial infection or co-infection with other pathogens not detected by the test. Clinical correlation with patient history and other diagnostic information is necessary to determine patient infection status. The expected result is Negative. Fact Sheet for Patients:  PinkCheek.be Fact Sheet for Healthcare Providers: GravelBags.it This test is not yet approved or cleared by the [place] FDA and  has been authorized for detection and/or diagnosis of SARS-CoV-2 by FDA under an Emergency Use Authorization (EUA).  This EUA will remain in effect (meaning this test can be  used) for the duration of  the COVID-19 declaration under Section 564(b)(1) of the Act, 21 U.S.C. section 360bbb-3(b)(1), unless the authorization is terminated or revoked sooner.    Influenza A by PCR NEGATIVE NEGATIVE Final    Influenza  B by PCR NEGATIVE NEGATIVE Final    Comment: (NOTE) The Xpert Xpress SARS-CoV-2/FLU/RSV assay is intended as an aid in  the diagnosis of influenza from Nasopharyngeal swab specimens and  should not be used as a sole basis for treatment. Nasal washings and  aspirates are unacceptable for Xpert Xpress SARS-CoV-2/FLU/RSV  testing. Fact Sheet for Patients: PinkCheek.be Fact Sheet for Healthcare Providers: GravelBags.it This test is not yet approved or cleared by the [place] FDA and  has been authorized for detection and/or diagnosis of SARS-CoV-2 by  FDA under an Emergency Use Authorization (EUA). This EUA will remain  in effect (meaning this test can be used) for the duration of the  Covid-19 declaration under Section 564(b)(1) of the Act, 21  U.S.C. section 360bbb-3(b)(1), unless the authorization is  terminated or revoked. Performed at [facility], [place] [address] [place], [place] [address]   MRSA PCR Screening     Status: None   Collection Time: 04/01/19  9:36 PM   Specimen: Nasal Mucosa; Nasopharyngeal  Result Value Ref Range Status   MRSA by PCR NEGATIVE NEGATIVE Final    Comment:        The GeneXpert MRSA Assay (FDA approved for NASAL specimens only), is one component of a comprehensive MRSA colonization surveillance program. It is not intended to diagnose MRSA infection nor to guide or monitor treatment for MRSA infections. Performed at [facility], [place] [address], [place], [place] [address]   Culture, blood (Routine X 2) w Reflex to ID Panel     Status: None (Preliminary result)   Collection Time: 04/03/19  3:52 PM   Specimen: BLOOD LEFT FOREARM  Result Value Ref Range Status   Specimen Description   Final    BLOOD LEFT FOREARM Performed at [facility] [address], [place], [place] [address]    Special Requests   Final    BOTTLES  DRAWN AEROBIC ONLY Blood Culture adequate volume Performed at [facility] [address] [place], [place] [address]    Culture   Final    NO GROWTH 3 DAYS Performed at [facility], [place] [address] [place], [place] [address]    Report Status PENDING  Incomplete  Culture, blood (Routine X 2) w Reflex to ID Panel     Status: None (Preliminary result)   Collection Time: 04/03/19  5:11 PM   Specimen: BLOOD LEFT ARM  Result Value Ref Range Status   Specimen Description   Final    BLOOD LEFT ARM Performed at [facility] [address] [place], [place] [address]    Special Requests   Final    BOTTLES DRAWN AEROBIC ONLY Blood Culture results may not be optimal due to an inadequate volume of blood received in culture bottles Performed at [facility] [address], [place], [place] [address]    Culture   Final    NO GROWTH 3 DAYS Performed at [facility], [place] [address], [place], [place] [address]    Report Status PENDING  Incomplete         Radiology Studies: DG CHEST PORT 1 VIEW  Result Date: 04/07/2019 CLINICAL DATA:  Hypothermia, elevated BP. Covid positive. Former smoker. *Best obtainable image, unable to sit patient upright due to pain from sacral ulcer. EXAM: PORTABLE CHEST 1 VIEW COMPARISON:  04/03/2019 and older studies. FINDINGS: Study is rotated. A density projects over the right mid to lower  lung, which may be external to this patient, having defined, geographic margins. Lungs show interstitial thickening and hazy airspace type opacities, latter more evident on the right, similar to the prior exam allowing for differences in patient positioning and technique. No convincing pleural effusion and no pneumothorax. IMPRESSION: 1. No convincing change from the most recent prior study. Irregular interstitial thickening, and more subtle hazy airspace type opacities. Subtle airspace opacities consistent with  atypical/viral infection. Electronically Signed   By: Lajean Manes M.D.   On: 04/07/2019 11:12        Scheduled Meds: . vitamin C  500 mg Oral Daily  . citalopram  10 mg Oral Daily  . dexamethasone (DECADRON) injection  6 mg Intravenous Q24H  . enoxaparin (LOVENOX) injection  40 mg Subcutaneous Q24H  . feeding supplement (ENSURE ENLIVE)  237 mL Oral BID BM  . [START ON 04/08/2019] levothyroxine  25 mcg Intravenous Daily  . multivitamin with minerals  1 tablet Oral Daily  . zinc sulfate  220 mg Oral Daily   Continuous Infusions:    LOS: 8 days    Time spent: 40 minutes    Irine Seal, MD [facility]   To contact the attending provider between 7A-7P or the covering provider during after hours 7P-7A, please log into the web site [email or web address] and access using universal [place] password for that web site. If you do not have the password, please call the hospital operator.  04/07/2019, 3:34 PM

## 2019-04-07 NOTE — Progress Notes (Signed)
Critical Lab Platelets  1048 Provider Made Aware Will continue to Monitor

## 2019-04-08 LAB — CULTURE, BLOOD (ROUTINE X 2)
Culture: NO GROWTH
Culture: NO GROWTH
Special Requests: ADEQUATE

## 2019-04-08 LAB — CBC WITH DIFFERENTIAL/PLATELET
Abs Immature Granulocytes: 0.07 10*3/uL (ref 0.00–0.07)
Basophils Absolute: 0 10*3/uL (ref 0.0–0.1)
Basophils Relative: 1 %
Eosinophils Absolute: 0 10*3/uL (ref 0.0–0.5)
Eosinophils Relative: 0 %
HCT: 30.5 % — ABNORMAL LOW (ref 36.0–46.0)
Hemoglobin: 9 g/dL — ABNORMAL LOW (ref 12.0–15.0)
Immature Granulocytes: 4 %
Lymphocytes Relative: 19 %
Lymphs Abs: 0.3 10*3/uL — ABNORMAL LOW (ref 0.7–4.0)
MCH: 26.5 pg (ref 26.0–34.0)
MCHC: 29.5 g/dL — ABNORMAL LOW (ref 30.0–36.0)
MCV: 90 fL (ref 80.0–100.0)
Monocytes Absolute: 0.2 10*3/uL (ref 0.1–1.0)
Monocytes Relative: 10 %
Neutro Abs: 1.2 10*3/uL — ABNORMAL LOW (ref 1.7–7.7)
Neutrophils Relative %: 66 %
Platelets: 1128 10*3/uL (ref 150–400)
RBC: 3.39 MIL/uL — ABNORMAL LOW (ref 3.87–5.11)
RDW: 22 % — ABNORMAL HIGH (ref 11.5–15.5)
WBC: 1.8 10*3/uL — ABNORMAL LOW (ref 4.0–10.5)
nRBC: 0 % (ref 0.0–0.2)

## 2019-04-08 LAB — BASIC METABOLIC PANEL
Anion gap: 8 (ref 5–15)
BUN: 25 mg/dL — ABNORMAL HIGH (ref 8–23)
CO2: 23 mmol/L (ref 22–32)
Calcium: 8 mg/dL — ABNORMAL LOW (ref 8.9–10.3)
Chloride: 112 mmol/L — ABNORMAL HIGH (ref 98–111)
Creatinine, Ser: 0.68 mg/dL (ref 0.44–1.00)
GFR calc Af Amer: >60 mL/min (ref >60)
GFR calc non Af Amer: >60 mL/min (ref >60)
Glucose, Bld: 56 mg/dL — ABNORMAL LOW (ref 70–99)
Potassium: 3.9 mmol/L (ref 3.5–5.1)
Sodium: 143 mmol/L (ref 135–145)

## 2019-04-08 LAB — GLUCOSE, CAPILLARY
Glucose-Capillary: 48 mg/dL — ABNORMAL LOW (ref 70–99)
Glucose-Capillary: 127 mg/dL — ABNORMAL HIGH (ref 70–99)
Glucose-Capillary: 64 mg/dL — ABNORMAL LOW (ref 70–99)
Glucose-Capillary: 136 mg/dL — ABNORMAL HIGH (ref 70–99)
Glucose-Capillary: 71 mg/dL (ref 70–99)
Glucose-Capillary: 82 mg/dL (ref 70–99)
Glucose-Capillary: 85 mg/dL (ref 70–99)

## 2019-04-08 LAB — URINE CULTURE: Culture: NO GROWTH

## 2019-04-08 LAB — MAGNESIUM: Magnesium: 1.6 mg/dL — ABNORMAL LOW (ref 1.7–2.4)

## 2019-04-08 LAB — PROCALCITONIN: Procalcitonin: <0.1 ng/mL

## 2019-04-08 MED ORDER — HYDRALAZINE HCL 25 MG PO TABS
25.0000 mg | ORAL_TABLET | Freq: Every day | ORAL | Status: DC
  Administered 2019-04-10 – 2019-04-14 (×5): 25 mg via ORAL
  Filled 2019-04-08 (×6): qty 1

## 2019-04-08 MED ORDER — DEXTROSE 50 % IV SOLN
12.5000 g | INTRAVENOUS | Status: AC
  Administered 2019-04-08: 08:00:00 12.5 g via INTRAVENOUS

## 2019-04-08 MED ORDER — POTASSIUM CHLORIDE 10 MEQ/100ML IV SOLN
10.0000 meq | INTRAVENOUS | Status: AC
  Administered 2019-04-08 (×3): 10 meq via INTRAVENOUS
  Filled 2019-04-08 (×3): qty 100

## 2019-04-08 MED ORDER — DEXTROSE 50 % IV SOLN
12.5000 g | INTRAVENOUS | Status: AC
  Administered 2019-04-08: 12.5 g via INTRAVENOUS
  Filled 2019-04-08: qty 50

## 2019-04-08 MED ORDER — MAGNESIUM SULFATE 4 GM/100ML IV SOLN
4.0000 g | Freq: Once | INTRAVENOUS | Status: AC
  Administered 2019-04-08: 4 g via INTRAVENOUS
  Filled 2019-04-08: qty 100

## 2019-04-08 MED ORDER — DEXTROSE 50 % IV SOLN
INTRAVENOUS | Status: AC
  Filled 2019-04-08: qty 50

## 2019-04-08 MED ORDER — DEXTROSE-NACL 5-0.45 % IV SOLN: INTRAVENOUS | Status: DC

## 2019-04-08 MED ORDER — DEXTROSE 10 % IV SOLN
INTRAVENOUS | Status: DC
  Filled 2019-04-08 (×3): qty 1000

## 2019-04-08 NOTE — Progress Notes (Signed)
Hypoglycemic Event  CBG: 64  Treatment: D50 25 mL (12.5 gm)  Symptoms: None- Pt unable to tell symptoms. No responding verbally at this time.  Follow-up CBG: Time: 0836 CBG Result:136  Possible Reasons for Event: Inadequate meal intake- Pt unable to safely take POs. Not alert enough.   Comments/MD notified: See new orders.     [place]

## 2019-04-08 NOTE — Progress Notes (Signed)
PROGRESS NOTE    Angela [name]  MIW:[id number] DOB: Jul 23, 1918 DOA: 03/30/2019 PCP: [name], MD    Brief Narrative:  Patient is a 86 year old female with history of breast CA status post mastectomy, anemia, anxiety, rheumatoid arthritis, hypothyroidism, recent hospital admission 3/4-3/10/2019 (secondary to multifocal pneumonia, hyponatremia, AKI, acute diastolic CHF, AVNRT) presented to ED with abdominal pain, fevers, 103.1 F, hypotensive BP 88/60, tachycardia 130, tachypnea with respiratory rate of 40.  O2 sats 98% on room air Patient was also found to have wound in the buttocks, sacral region Chest x-ray showed moderate to marked severity left basilar atelectasis and/or infiltrates.  Patient was placed on vancomycin, cefepime, metronidazole. COVID-19 positive   Assessment & Plan:   Principal Problem:   Pneumonia due to COVID-19 virus Active Problems:   Sepsis ([place])   Acute respiratory failure with hypoxia (HCC)   Acute diastolic CHF (congestive heart failure) (HCC)   AKI (acute kidney injury) ([place])   Acute respiratory failure (HCC)   Hypothyroidism   Hypernatremia   Pulmonary embolus (HCC)   Thrombocytosis (HCC)   Hypothermia  1 acute hypoxemic respiratory failure due to acute COVID-19 viral pneumonia, POA/acute PE/sepsis secondary to HCAP Patient presented with systemic inflammatory response syndrome with hypotension, tachycardia tachypnea, COVID-19 was positive.  Chest x-ray showed moderate to marked severe left base atelectasis/infiltrates.  COVID-19 was positive.  CT angiogram chest showed a small pulmonary embolus in the right lower lobe in the segmental pulmonary artery with right heart strain.  Patient pancultured with cultures pending with no growth to date..  Inflammatory markers of D-dimer, CRP, ferritin trending down.  Patient currently afebrile.  Sats of 99 % on room air.  Continue Decadron.  Status post 5 days of IV remdesivir.  Status post 7 days IV cefepime.   On full dose Lovenox. Supportive care.  2.  Hypothermia Patient noted to be hypothermic (04/07/2019).  Patient more alert this morning however has been minimally responsive during this hospitalization.  Patient has been pancultured results pending.  Repeat chest x-ray with no significant change from prior study.  Lactic acid level within normal limits.  Procalcitonin levels negative.  Patient Synthroid dose has been increased to 25 MCG's IV daily.  Warm blankets as needed.  Supportive care.   3.  Pulmonary embolism Per CT angiogram chest that showed an acute right lower lobe PE with possible right heart strain.  Unable to get lower extremity Dopplers due to concern for contractures.  2D echo with a EF of 55 to 60%, mildly reduced right ventricular systolic function, moderately elevated PA pressure.  Patient initially placed on IV heparin has been transitioned to full dose Lovenox.  Follow.  4.  Acute diastolic CHF exacerbation 2D echo done from 03/09/2019 with normal LV function, grade 1 diastolic dysfunction.  Concern that patient volume overloaded on admission with significant bilateral lower extremity edema and JVD.  CT angiogram chest done showed a PE with possible right heart strain.  Volume overload improved.  Patient with borderline blood pressure and tachycardia and as such diuretics were held. Patient received a 500 cc bolus of normal saline.  Beta-blocker discontinued as patient noted to be bradycardic.  Follow.  5 neutropenia/thrombocytosis Likely secondary to acute illness and acute phase reactant.  Patient has been pancultured results pending.  Procalcitonin is negative.  Lactic acid level at 1.1.  Patient on full dose Lovenox for acute PE.  No need for any further antibiotics at this time.  Supportive care.  6.  Acute kidney injury Felt secondary to possible acute CHF, hypotension with decreased renal perfusion and COVID-19 pneumonia.  Creatinine currently at 0.68.  Follow.  7.   Hypernatremia Likely secondary to poor oral intake.  Patient noted to be on diuretics which have been discontinued.  Improved on gentle hydration with D5W.  Patient noted to be hypoglycemic this morning as such IV fluids have been changed to D10.  Continue gentle hydration as patient with significantly poor oral intake.    8.  Hypokalemia Potassium at 3.9.    9.  Hypothyroidism TSH was 12.9. It was noted per admitting physician that patient had not taken her thyroid medication over a month.  Patient noted to be hypothermic.  Synthroid has been increased to 25 MCG's IV daily. Will need repeat thyroid function studies done in about 4 - 6 weeks in the outpatient setting.  10.  Injury documentation Right hip lateral stage II/mid sacrum stage II, medial aspect of left knee Continue current wound care recommendations.  11.  Severe protein calorie malnutrition/probable underlying dementia Patient with severe protein calorie malnutrition.  Patient per RN pocketing pills and refusing oral intake.  Patient with minimal to no oral intake.  Could likely be secondary to acute infection with probable underlying dementia.  Supportive care.  12.  Bradycardia Beta-blocker has been discontinued.  Synthroid dose has been increased.  Follow.  13.  Hypomagnesemia Replete.  14.  Hypoglycemia Likely secondary to poor to no oral intake.  Patient was on D5 normal saline which was changed to D10 at 50 cc/h.  Supportive care.  15.  Goals of care Patient with advanced age with multiple comorbidities, now with COVID-19 pneumonia.  Palliative care was consulted and decision was made to continue aggressive care at this time.  Patient minimally responsive over the past few days and also had noted to be hypothermic, also with neutropenia and also noted to be bradycardic.   Patient overall with a poor prognosis.  Palliative care reconsulted to reassess.  Will likely need palliative care to follow-up in the outpatient  setting once patient is discharged.   DVT prophylaxis: Heparin Code Status: DNR Family Communication: Tried calling son, Miriana Gaertner on the telephone however no answer and phone went to a voice mail.   Disposition Plan:  . Patient came from: Home            . Anticipated d/c place: To be determined.  Likely back home versus SNF. Marland Kitchen Barriers to d/c OR conditions which need to be met to effect a safe d/c: Improvement with sepsis, hypothermia, neutropenia, pneumonia with concern for questionable CHF, COVID-19 respiratory failure.  Improvement with mental status.   Consultants:   Palliative care: Dr. Domingo Cocking 04/01/2019  Procedures:   CT angiogram chest 03/31/2019  CT abdomen and pelvis 03/30/2019  2D echo 04/01/2019  Chest x-ray 04/07/2019  Antimicrobials  IV cefepime 03/30/2019>>>>> 04/05/2019  IV Flagyl 3/26/2021x1 dose  IV vancomycin 3/26/2021x1 dose   Subjective: Patient noted to be minimally responsive this morning per RN.  During rounds patient noted to be more alert and asking for food.  Patient more interactive today than she has been over the past 3 to 4 days.  Hypothermia improving.  Patient also noted to be hypoglycemic this morning.  Objective: Vitals:   04/07/19 2252 04/08/19 0202 04/08/19 0616 04/08/19 0829  BP: (!) 152/72 (!) 161/68 (!) 174/80 (!) 146/66  Pulse: 63 60 (!) 54 (!) 50  Resp: 18 18  14  Temp: 97.6 F (36.4 C) 97.9 F (36.6 C) (!) 97.4 F (36.3 C) (!) 97.2 F (36.2 C)  TempSrc: Rectal Oral Oral Axillary  SpO2: 96% 96% 97% 95%  Weight:      Height:        Intake/Output Summary (Last 24 hours) at 04/08/2019 1125 Last data filed at 04/08/2019 0253 Gross per 24 hour  Intake --  Output 725 ml  Net -725 ml   Filed Weights   03/30/19 1757  Weight: 40.8 kg    Examination:  General exam: Cachectic.  Frail.  More alert today. Respiratory system: Lungs clear to auscultation bilaterally anterior lung fields.  No wheezes, no crackles, no  rhonchi. Cardiovascular system: RRR no murmurs rubs or gallops.  No JVD.  No lower extremity edema.   Gastrointestinal system: Abdomen is nontender, nondistended, soft, positive bowel sounds.  No rebound.  No guarding.   Central nervous system: More alert today. Extremities: No clubbing cyanosis or edema.  Skin: Sacral, right hip, medial aspect of left knee wounds.  Psychiatry: Judgement and insight appear poor-fair. Mood & affect appropriate.             Data Reviewed: I have personally reviewed following labs and imaging studies  CBC: Recent Labs  Lab 04/03/19 0459 04/03/19 0459 04/04/19 0415 04/05/19 0355 04/06/19 0423 04/07/19 0343 04/08/19 0412  WBC 1.9*   < > 2.2* 2.0* 1.7* 1.5* 1.8*  NEUTROABS 1.4*  --  1.1* 1.5*  --  0.9* 1.2*  HGB 9.9*   < > 8.6* 8.5* 8.4* 8.6* 9.0*  HCT 33.4*   < > 29.6* 29.5* 28.4* 29.5* 30.5*  MCV 90.3   < > 92.2 91.0 89.6 91.9 90.0  PLT 787*   < > 830* 835* 930* 1,048* 1,128*   < > = values in this interval not displayed.   Basic Metabolic Panel: Recent Labs  Lab 04/04/19 0415 04/05/19 0355 04/06/19 0423 04/07/19 0343 04/08/19 0412 04/08/19 0954  NA 147* 144 143 142 143  --   K 3.4* 3.8 3.5 3.9 3.9  --   CL 110 109 108 110 112*  --   CO2 27 25 27 24 23   --   GLUCOSE 72 126* 133* 83 56*  --   BUN 48* 46* 39* 29* 25*  --   CREATININE 1.19* 1.16* 0.92 0.80 0.68  --   CALCIUM 7.7* 7.7* 8.1* 8.0* 8.0*  --   MG  --   --   --   --   --  1.6*   GFR: Estimated Creatinine Clearance: 24.7 mL/min (by C-G formula based on SCr of 0.68 mg/dL). Liver Function Tests: No results for input(s): AST, ALT, ALKPHOS, BILITOT, PROT, ALBUMIN in the last 168 hours. No results for input(s): LIPASE, AMYLASE in the last 168 hours. No results for input(s): AMMONIA in the last 168 hours. Coagulation Profile: No results for input(s): INR, PROTIME in the last 168 hours. Cardiac Enzymes: No results for input(s): CKTOTAL, CKMB, CKMBINDEX, TROPONINI in the  last 168 hours. BNP (last 3 results) No results for input(s): PROBNP in the last 8760 hours. HbA1C: No results for input(s): HGBA1C in the last 72 hours. CBG: Recent Labs  Lab 04/03/19 1605 04/08/19 0526 04/08/19 0551 04/08/19 0741 04/08/19 0836  GLUCAP 133* 48* 127* 64* 136*   Lipid Profile: No results for input(s): CHOL, HDL, LDLCALC, TRIG, CHOLHDL, LDLDIRECT in the last 72 hours. Thyroid Function Tests: No results for input(s): TSH, T4TOTAL, FREET4, T3FREE, THYROIDAB in the  last 72 hours. Anemia Panel: No results for input(s): VITAMINB12, FOLATE, FERRITIN, TIBC, IRON, RETICCTPCT in the last 72 hours. Sepsis Labs: Recent Labs  Lab 04/02/19 0422 04/03/19 0459 04/07/19 1130 04/08/19 0412  PROCALCITON 0.61 0.66 <0.10 <0.10  LATICACIDVEN  --   --  1.1  --     Recent Results (from the past 240 hour(s))  Culture, blood (Routine x 2)     Status: None   Collection Time: 03/30/19  5:30 PM   Specimen: BLOOD  Result Value Ref Range Status   Specimen Description   Final    BLOOD RIGHT ANTECUBITAL Performed at [facility], [place] [address], [place], [place] [address]    Special Requests   Final    BOTTLES DRAWN AEROBIC AND ANAEROBIC Blood Culture results may not be optimal due to an excessive volume of blood received in culture bottles Performed at [facility] [address], [place], [place] [address]    Culture   Final    NO GROWTH 5 DAYS Performed at [facility], [place] [address], [place], [place] [address]    Report Status 04/04/2019 FINAL  Final  Culture, blood (Routine x 2)     Status: None   Collection Time: 03/30/19  5:40 PM   Specimen: BLOOD  Result Value Ref Range Status   Specimen Description   Final    BLOOD LEFT ANTECUBITAL Performed at [facility] [address] [place], [place] [address]    Special Requests   Final    BOTTLES DRAWN AEROBIC AND ANAEROBIC Blood Culture results may not be  optimal due to an excessive volume of blood received in culture bottles Performed at [facility] [address], [place], [place] [address]    Culture   Final    NO GROWTH 5 DAYS Performed at [facility], [place] [address] [place], [place] [address]    Report Status 04/04/2019 FINAL  Final  Urine culture     Status: None   Collection Time: 03/30/19  6:01 PM   Specimen: In/Out Cath Urine  Result Value Ref Range Status   Specimen Description   Final    IN/OUT CATH URINE Performed at [facility] [address] [place], [place] [address]    Special Requests   Final    NONE Performed at [facility], [place] [address], [place], [place] [address]    Culture   Final    NO GROWTH Performed at [facility], [place] [address], [place], [place] [address]    Report Status 04/01/2019 FINAL  Final  Respiratory Panel by RT PCR (Flu A&B, Covid) - Nasopharyngeal Swab     Status: Abnormal   Collection Time: 03/30/19  7:31 PM   Specimen: Nasopharyngeal Swab  Result Value Ref Range Status   SARS Coronavirus 2 by RT PCR POSITIVE (A) NEGATIVE Final    Comment: RESULT CALLED TO, READ BACK BY AND VERIFIED WITH: N,MOSCINSKI AT 2306 ON 03/30/19 BY A,MOHAMED (NOTE) SARS-CoV-2 target nucleic acids are DETECTED. SARS-CoV-2 RNA is generally detectable in upper respiratory specimens  during the acute phase of infection. Positive results are indicative of the presence of the identified virus, but do not rule out bacterial infection or co-infection with other pathogens not detected by the test. Clinical correlation with patient history and other diagnostic information is necessary to determine patient infection status. The expected result is Negative. Fact Sheet for Patients:  PinkCheek.be Fact Sheet for Healthcare Providers: GravelBags.it This test is not yet approved or cleared by  the [place] FDA and  has been authorized for detection and/or diagnosis of SARS-CoV-2 by FDA under an Emergency Use Authorization (EUA).  This EUA will remain in effect (meaning this test can be  used) for the duration of  the COVID-19 declaration under Section 564(b)(1) of the Act, 21 U.S.C. section 360bbb-3(b)(1), unless the authorization is terminated or revoked sooner.    Influenza A by PCR NEGATIVE NEGATIVE Final   Influenza B by PCR NEGATIVE NEGATIVE Final    Comment: (NOTE) The Xpert Xpress SARS-CoV-2/FLU/RSV assay is intended as an aid in  the diagnosis of influenza from Nasopharyngeal swab specimens and  should not be used as a sole basis for treatment. Nasal washings and  aspirates are unacceptable for Xpert Xpress SARS-CoV-2/FLU/RSV  testing. Fact Sheet for Patients: PinkCheek.be Fact Sheet for Healthcare Providers: GravelBags.it This test is not yet approved or cleared by the [place] FDA and  has been authorized for detection and/or diagnosis of SARS-CoV-2 by  FDA under an Emergency Use Authorization (EUA). This EUA will remain  in effect (meaning this test can be used) for the duration of the  Covid-19 declaration under Section 564(b)(1) of the Act, 21  U.S.C. section 360bbb-3(b)(1), unless the authorization is  terminated or revoked. Performed at [facility], [place] [address], [place], [place] [address]   MRSA PCR Screening     Status: None   Collection Time: 04/01/19  9:36 PM   Specimen: Nasal Mucosa; Nasopharyngeal  Result Value Ref Range Status   MRSA by PCR NEGATIVE NEGATIVE Final    Comment:        The GeneXpert MRSA Assay (FDA approved for NASAL specimens only), is one component of a comprehensive MRSA colonization surveillance program. It is not intended to diagnose MRSA infection nor to guide or monitor treatment for MRSA infections. Performed at [facility], [place] [address], [place], [place] [address]   Culture, blood (Routine X 2) w Reflex to ID Panel     Status: None   Collection Time: 04/03/19  3:52 PM   Specimen: BLOOD LEFT FOREARM  Result Value Ref Range Status   Specimen Description   Final    BLOOD LEFT FOREARM Performed at [facility] [address], [place], [place] [address]    Special Requests   Final    BOTTLES DRAWN AEROBIC ONLY Blood Culture adequate volume Performed at [facility] [address], [place], [place] [address]    Culture   Final    NO GROWTH 5 DAYS Performed at [facility], [place] [address] [place], [place] [address]    Report Status 04/08/2019 FINAL  Final  Culture, blood (Routine X 2) w Reflex to ID Panel     Status: None   Collection Time: 04/03/19  5:11 PM   Specimen: BLOOD LEFT ARM  Result Value Ref Range Status   Specimen Description   Final    BLOOD LEFT ARM Performed at [facility] [address] [place], [place] [address]    Special Requests   Final    BOTTLES DRAWN AEROBIC ONLY Blood Culture results may not be optimal due to an inadequate volume of blood received in culture bottles Performed at [facility] [address] [place], [place] [address]    Culture   Final    NO GROWTH  5 DAYS Performed at [facility], [place] [address], [place], [place] [address]    Report Status 04/08/2019 FINAL  Final  Culture, blood (Routine X 2) w Reflex to ID Panel     Status: None (Preliminary result)   Collection Time: 04/07/19 11:30 AM   Specimen: BLOOD  Result Value Ref Range Status   Specimen Description   Final    BLOOD RIGHT ANTECUBITAL Performed at [facility] [address], [place], [place] [address]    Special Requests   Final    BOTTLES DRAWN AEROBIC AND ANAEROBIC Blood Culture adequate volume Performed at [facility] [address],  [place], [place] [address]    Culture   Final    NO GROWTH < 24 HOURS Performed at [facility] [address], [place], [place] [address]    Report Status PENDING  Incomplete  Culture, blood (Routine X 2) w Reflex to ID Panel     Status: None (Preliminary result)   Collection Time: 04/07/19 11:49 AM   Specimen: BLOOD  Result Value Ref Range Status   Specimen Description   Final    BLOOD RIGHT ANTECUBITAL Performed at [facility] [address] [place], [place] [address]    Special Requests   Final    BOTTLES DRAWN AEROBIC ONLY Blood Culture adequate volume Performed at [facility] [address] [place], [place] [address]    Culture   Final    NO GROWTH < 24 HOURS Performed at [facility] [address], [place], [place] [address]    Report Status PENDING  Incomplete         Radiology Studies: DG CHEST PORT 1 VIEW  Result Date: 04/07/2019 CLINICAL DATA:  Hypothermia, elevated BP. Covid positive. Former smoker. *Best obtainable image, unable to sit patient upright due to pain from sacral ulcer. EXAM: PORTABLE CHEST 1 VIEW COMPARISON:  04/03/2019 and older studies. FINDINGS: Study is rotated. A density projects over the right mid to lower lung, which may be external to this patient, having defined, geographic margins. Lungs show interstitial thickening and hazy airspace type opacities, latter more evident on the right, similar to the prior exam allowing for differences in patient positioning and technique. No convincing pleural effusion and no pneumothorax. IMPRESSION: 1. No convincing change from the most recent prior study. Irregular interstitial thickening, and more subtle hazy airspace type opacities. Subtle airspace opacities consistent with atypical/viral infection. Electronically Signed   By: Lajean Manes M.D.   On: 04/07/2019 11:12        Scheduled Meds: . vitamin C  500 mg Oral Daily  . citalopram  10 mg Oral Daily   . dexamethasone (DECADRON) injection  6 mg Intravenous Q24H  . enoxaparin (LOVENOX) injection  40 mg Subcutaneous Q24H  . feeding supplement (ENSURE ENLIVE)  237 mL Oral BID BM  . hydrALAZINE  25 mg Oral Daily  . levothyroxine  25 mcg Intravenous Daily  . multivitamin with minerals  1 tablet Oral Daily  . zinc sulfate  220 mg Oral Daily   Continuous Infusions: . dextrose 50 mL/hr at 04/08/19 1044  . potassium chloride 10 mEq (04/08/19 1043)     LOS: 9 days    Time spent: 40 minutes    Irine Seal, MD [facility]   To contact the attending provider between 7A-7P or the covering provider during after hours 7P-7A, please log into the web site [email or web address]  and access using universal [place] password for that web site. If you do not have the password, please call the hospital operator.  04/08/2019, 11:25 AM

## 2019-04-08 NOTE — Progress Notes (Signed)
[facility] for LMWH Indication: pulmonary embolus  Allergies  Allergen Reactions  . Penicillins     REACTION: Question of penicillin allergy Did it involve swelling of the face/tongue/throat, SOB, or low BP? Unknown Did it involve sudden or severe rash/hives, skin peeling, or any reaction on the inside of your mouth or nose? Unknown Did you need to seek medical attention at a hospital or doctor's office? Unknown When did it last happen?Unknown If all above answers are "NO", may proceed with cephalosporin use.    Patient Measurements: Height: 5\' 3"  (160 cm) Weight: 40.8 kg (89 lb 15.2 oz) IBW/kg (Calculated) : 52.4  Vital Signs: Temp: 97.2 F (36.2 C) (04/04 0829) Temp Source: Axillary (04/04 0829) BP: 146/66 (04/04 0829) Pulse Rate: 50 (04/04 0829)  Labs: Recent Labs    04/06/19 0423 04/06/19 0423 04/07/19 0343 04/08/19 0412  HGB 8.4*   < > 8.6* 9.0*  HCT 28.4*  --  29.5* 30.5*  PLT 930*  --  1,048* 1,128*  CREATININE 0.92  --  0.80 0.68   < > = values in this interval not displayed.   Estimated Creatinine Clearance: 24.7 mL/min (by C-G formula based on SCr of 0.68 mg/dL).  Assessment: 86 yo female with COVID PNA, ddimer > 5, now found to have a PE per CT scan.  Transitioned to Full dose LMWH for PE treatment on 4/1.  Pt unable/unwilling to take oral medicines.    Today, 04/08/2019  Hgb 9 - stable. PLT elevated - 1128  SCr 0.68, CrCl ~ 25 ml/min  No bleeding reported   Plan:  continue LMWH 40 mg sq q24 which is full treatment dose 1 mg/kg for pt wt 40.8 & CrCl < 30 ml/min  Eudelia Bunch, Pharm.D [phone] 04/08/2019 9:48 AM

## 2019-04-08 NOTE — Progress Notes (Signed)
Paged Angela Corin, NP regarding glucose on AM labs noted to be 56. Patient with poor po intake. Awaiting orders

## 2019-04-08 NOTE — Progress Notes (Signed)
PMT no charge note.   Chart reviewed, PMT following peripherally. Discussed today with bedside RN as well as with TRH MD. An interdisciplinary review of the patient's current condition and hospital course thus far was done.   Patient continues to decline despite best possible care and interventions. Cognitive status continues to decline, PO intake is minimal to nil. She is not awake/alert meaningfully. At this point, does not appear to have reversible factors that will contribute towards a meaningful recovery.   Hence, recommend consideration for comfort measures, discontinuation of routine blood work and IVF etc. Call placed to discuss with son [name] twice, unable to reach.   Recommend family be authorized for a short visit, patient likely with ongoing decline, high risk for ongoing decompensation, and, in my opinion, likely will not be able to survive this hospitalization.   PMT will follow up on 4-5.   Loistine Chance MD [facility].  [phone].  No charge.

## 2019-04-08 NOTE — Progress Notes (Signed)
Patient's son, Shaniqa Mcgeorge, visited patient at bedside after receiving permission from 2 MDs, CN, and AC. Son donned in full PPE (N95, shield, gown, gloves) prior to entering room. Patient is currently awake and alert, had a conversation with her son. Pt oriented to self and her son only, but asking for "collard greens" and "sausage." Pt c/o being hungry, but only ate 6 bites of chocolate pudding and 3-4 bites of applesauce. Unable to suck liquids through a straw and does not attempt to swallow liquids when fed with a spoon- liquids spill out of corner of mouth. Attempted to feed pt meatloaf with gravy and mashed potatoes, but pt held most in her mouth and did not attempt to swallow. Pt also voided a large amount of incontinent urine on bedpad. Dressing changes done and patient turned frequently. Will continue to monitor closely.

## 2019-04-09 LAB — CBC WITH DIFFERENTIAL/PLATELET
Abs Immature Granulocytes: 0.1 10*3/uL — ABNORMAL HIGH (ref 0.00–0.07)
Basophils Absolute: 0 10*3/uL (ref 0.0–0.1)
Basophils Relative: 1 %
Eosinophils Absolute: 0 10*3/uL (ref 0.0–0.5)
Eosinophils Relative: 0 %
HCT: 31.3 % — ABNORMAL LOW (ref 36.0–46.0)
Hemoglobin: 9.1 g/dL — ABNORMAL LOW (ref 12.0–15.0)
Immature Granulocytes: 5 %
Lymphocytes Relative: 22 %
Lymphs Abs: 0.4 10*3/uL — ABNORMAL LOW (ref 0.7–4.0)
MCH: 26.7 pg (ref 26.0–34.0)
MCHC: 29.1 g/dL — ABNORMAL LOW (ref 30.0–36.0)
MCV: 91.8 fL (ref 80.0–100.0)
Monocytes Absolute: 0.2 10*3/uL (ref 0.1–1.0)
Monocytes Relative: 11 %
Neutro Abs: 1.1 10*3/uL — ABNORMAL LOW (ref 1.7–7.7)
Neutrophils Relative %: 61 %
Platelets: 1106 10*3/uL (ref 150–400)
RBC: 3.41 MIL/uL — ABNORMAL LOW (ref 3.87–5.11)
RDW: 21.9 % — ABNORMAL HIGH (ref 11.5–15.5)
WBC: 1.8 10*3/uL — ABNORMAL LOW (ref 4.0–10.5)
nRBC: 0 % (ref 0.0–0.2)

## 2019-04-09 LAB — BASIC METABOLIC PANEL
Anion gap: 6 (ref 5–15)
BUN: 19 mg/dL (ref 8–23)
CO2: 23 mmol/L (ref 22–32)
Calcium: 8.1 mg/dL — ABNORMAL LOW (ref 8.9–10.3)
Chloride: 108 mmol/L (ref 98–111)
Creatinine, Ser: 0.77 mg/dL (ref 0.44–1.00)
GFR calc Af Amer: >60 mL/min (ref >60)
GFR calc non Af Amer: >60 mL/min (ref >60)
Glucose, Bld: 152 mg/dL — ABNORMAL HIGH (ref 70–99)
Potassium: 4.9 mmol/L (ref 3.5–5.1)
Sodium: 137 mmol/L (ref 135–145)

## 2019-04-09 LAB — PATHOLOGIST SMEAR REVIEW

## 2019-04-09 LAB — MAGNESIUM: Magnesium: 2.7 mg/dL — ABNORMAL HIGH (ref 1.7–2.4)

## 2019-04-09 LAB — GLUCOSE, CAPILLARY
Glucose-Capillary: 112 mg/dL — ABNORMAL HIGH (ref 70–99)
Glucose-Capillary: 116 mg/dL — ABNORMAL HIGH (ref 70–99)
Glucose-Capillary: 139 mg/dL — ABNORMAL HIGH (ref 70–99)
Glucose-Capillary: 86 mg/dL (ref 70–99)
Glucose-Capillary: 90 mg/dL (ref 70–99)
Glucose-Capillary: 91 mg/dL (ref 70–99)

## 2019-04-09 LAB — PROCALCITONIN: Procalcitonin: <0.1 ng/mL

## 2019-04-09 MED ORDER — DEXTROSE-NACL 5-0.9 % IV SOLN: INTRAVENOUS | Status: DC

## 2019-04-09 NOTE — Progress Notes (Signed)
Received report from [place], Therapist, sports. Agree with previous assessment. Pt laying in bed, VSS, in no apparent distress. Will continue to monitor

## 2019-04-09 NOTE — Progress Notes (Signed)
PMT progress note  Discussed with bedside RN. Chart reviewed, call placed and discussed with son Angela [name].   BP (!) 152/78 (BP Location: Left Arm)   Pulse 63   Temp (!) 97.3 F (36.3 C) (Oral)   Resp 19   Ht 5\' 3"  (1.6 m)   Wt 40.8 kg   SpO2 100%   BMI 15.93 kg/m  Labs, imaging and med history noted.   Overall, patient remains confused, mental status waxes and wanes. PO intake remains minimal. At times, patient is awake/alert asking for food, at times she is confused and refusing PO including medications.   Call placed and discussed broad goals of care with son Angela [name]. He is so thankful for the opportunity to have had a chance to come visit with his mother. He states that the patient interacted with him well, she asked him, "where have you been?". She also asked for all of her children by name. Angela [name] states that his mother doesn't have cognitive deficits, he was her primary caregiver at home.   Angela [name] remains invested in continuing current mode of treatment, he is hopeful that the patient's PO intake, mental status and overall condition will gradually improve. He is aware that she might require SNF towards the end of this hospitalization. If possible, he is requesting for a repeat short visit this week, so that he may come encourage the patient to improve her PO intake face-to-face.   I did try having discussions with son Angela [name] that the patient has advanced age, frailty, deconditioning and serious illnesses that may not improve. Answered his questions to the best of my ability. No change in goals of care, no new PMT specific recommendations at this time, other than to continue to encourage PO and to allow son visits if possible, and to consider SNF rehab with palliative on discharge. PMT to follow peripherally. Please call [phone] if we can be of more assistance.   Loistine Chance MD [facility] Total time 20 minutes.   Greater than 50% of this time  was spent counseling and coordinating care related to the above assessment and plan.

## 2019-04-09 NOTE — Progress Notes (Signed)
Patient is awake and alert. I crushed her PO meds and attempted to give them in ice cream. She repeatedly refused and spit it out. Also refused Ensure.

## 2019-04-09 NOTE — Progress Notes (Signed)
Nutrition Follow-up  RD working remotely.   DOCUMENTATION CODES:   Underweight  INTERVENTION:  - continue Ensure Enlive BID and Magic Cup BID.  - continue to encourage PO intakes as tolerated.  - will monitor decisions regarding GOC.    NUTRITION DIAGNOSIS:   Increased nutrient needs related to wound healing, acute illness, catabolic [id number] infection) as evidenced by estimated needs. -ongoing  GOAL:   Patient will meet greater than or equal to 90% of their needs -unmet  MONITOR:   PO intake, Supplement acceptance, Labs, Weight trends, Skin  ASSESSMENT:   86 year old female with history of breast cancer s/p mastectomy, anemia, anxiety, rheumatoid arthritis, hypothyroidism, hospitalization 3/4-3/10 for multifocal PNA, hyponatremia, AKI, acute CHF, AVNRT. She presented to ED with abdominal pain, fevers up to103.1 F, hypotensive BP 88/60, tachycardia 130, tachypnea with respiratory rate of 40. CXR showed moderate left basilar atelectasis and/or infiltrates. Patient was placed on vancomycin, cefepime, metronidazole.  Patient has mainly consumed 0% of meals over the past 5 days. She has accepted Ensure ~25% of the time offered. She has not been weighed since 3/26.   RN notes state patient refusing PO items and that she has often been pocketing items she will accept. Patient is being followed by Palliative Care. Son was allowed to visit yesterday afternoon. Ongoing decline. Dr. Inda Castle note from 4/4 AM indicates patient may not survive this hospitalization.   Labs reviewed; CBGs: 116, 139, 112 mg/dl, Ca: 8.1 mg/dl, Mg: 2.7 mg/dl. Medications reviewed; 500 mg ascorbic acid/day, 25 mcg IV synthroid/day, 4 g IV Mg sulfate x1 dose 4/4, daily multivitamin with minerals, 10 mEq IV KCl x3 runs 4/4, 220 mg zinc sulfate/day.  IVF; D10 @ 50 ml/hr (408 kcal).     NUTRITION - FOCUSED PHYSICAL EXAM:  unable to complete at this time.   Diet Order:   Diet Order            DIET  SOFT Room service appropriate? Yes; Fluid consistency: Thin  Diet effective now              EDUCATION NEEDS:   No education needs have been identified at this time  Skin:  Skin Assessment: Skin Integrity Issues: Skin Integrity Issues:: Stage II, Other (Comment) Stage II: L knee (3/17); R hip; sacrum Other: wound to L buttocks (3/27)  Last BM:  3/28  Height:   Ht Readings from Last 1 Encounters:  03/30/19 5\' 3"  (1.6 m)    Weight:   Wt Readings from Last 1 Encounters:  03/30/19 40.8 kg    Ideal Body Weight:  52.3 kg  BMI:  Body mass index is 15.93 kg/m.  Estimated Nutritional Needs:   Kcal:  1400-1650 kcal  Protein:  70-85 grams  Fluid:  >/= 1.6 L/day     Jarome Matin, MS, RD, LDN, CNSC Inpatient Clinical Dietitian RD pager # available in [facility]  After hours/weekend pager # available in [facility]

## 2019-04-09 NOTE — Plan of Care (Signed)
  Problem: Clinical Measurements: Goal: Will remain free from infection Outcome: Progressing Goal: Respiratory complications will improve Outcome: Progressing Goal: Cardiovascular complication will be avoided Outcome: Progressing   

## 2019-04-09 NOTE — Progress Notes (Signed)
PROGRESS NOTE    Angela [name]  DQQ:[id number] DOB: 02-Jul-1918 DOA: 03/30/2019 PCP: [name], MD    Brief Narrative:  Patient is a 86 year old female with history of breast CA status post mastectomy, anemia, anxiety, rheumatoid arthritis, hypothyroidism, recent hospital admission 3/4-3/10/2019 (secondary to multifocal pneumonia, hyponatremia, AKI, acute diastolic CHF, AVNRT) presented to ED with abdominal pain, fevers, 103.1 F, hypotensive BP 88/60, tachycardia 130, tachypnea with respiratory rate of 40.  O2 sats 98% on room air Patient was also found to have wound in the buttocks, sacral region Chest x-ray showed moderate to marked severity left basilar atelectasis and/or infiltrates.  Patient was placed on vancomycin, cefepime, metronidazole. COVID-19 positive   Assessment & Plan:   Principal Problem:   Pneumonia due to COVID-19 virus Active Problems:   Sepsis ([place])   Acute respiratory failure with hypoxia (HCC)   Acute diastolic CHF (congestive heart failure) (HCC)   AKI (acute kidney injury) ([facility])   Acute respiratory failure (HCC)   Hypothyroidism   Hypernatremia   Pulmonary embolus (HCC)   Thrombocytosis (HCC)   Hypothermia  1 acute hypoxemic respiratory failure due to acute COVID-19 viral pneumonia, POA/acute PE/sepsis secondary to HCAP Patient presented with systemic inflammatory response syndrome with hypotension, tachycardia tachypnea, COVID-19 was positive.  Chest x-ray showed moderate to marked severe left base atelectasis/infiltrates.  COVID-19 was positive.  CT angiogram chest showed a small pulmonary embolus in the right lower lobe in the segmental pulmonary artery with right heart strain.  Patient pancultured with cultures pending with no growth to date..  Inflammatory markers of D-dimer, CRP, ferritin trending down.  Patient currently afebrile.  Sats of 99 % on room air.  Continue Decadron.  Status post 5 days of IV remdesivir.  Status post 7 days IV cefepime.   On full dose Lovenox. Supportive care.  2.  Hypothermia Patient noted to be hypothermic (04/07/2019).  Patient more alert this morning and following commands however early on in the hospitalization was minimally responsive.  Patient with poor oral intake.  Patient has been pancultured results pending.  Repeat chest x-ray with no significant change from prior study.  Lactic acid level within normal limits.  Procalcitonin levels negative.  Patient Synthroid dose has been increased to 25 MCG's IV daily.  Warm blankets as needed.  Supportive care.   3.  Pulmonary embolism Per CT angiogram chest that showed an acute right lower lobe PE with possible right heart strain.  Unable to get lower extremity Dopplers due to concern for contractures.  2D echo with a EF of 55 to 60%, mildly reduced right ventricular systolic function, moderately elevated PA pressure.  Patient initially placed on IV heparin has been transitioned to full dose Lovenox.  Follow.  4.  Acute diastolic CHF exacerbation 2D echo done from 03/09/2019 with normal LV function, grade 1 diastolic dysfunction.  Concern that patient volume overloaded on admission with significant bilateral lower extremity edema and JVD.  CT angiogram chest done showed a PE with possible right heart strain.  Volume overload improved.  Patient with borderline blood pressure and tachycardia and as such diuretics were held. Patient received a 500 cc bolus of normal saline during the hospitalization.  Beta-blocker discontinued as patient noted to be bradycardic.  Follow.  5 neutropenia/thrombocytosis Likely secondary to acute illness and acute phase reactant.  Patient has been pancultured results pending.  Neutropenia slowly improving.  Thrombocytosis slowly improving.  Procalcitonin is negative.  Lactic acid level at 1.1.  Patient on full  dose Lovenox for acute PE.  No need for any further antibiotics at this time.  Supportive care.    6.  Acute kidney injury Felt secondary  to possible acute CHF, hypotension with decreased renal perfusion and COVID-19 pneumonia.  Creatinine currently at 0.77.  Follow.  7.  Hypernatremia Likely secondary to poor oral intake.  Patient noted to be on diuretics which have been discontinued.  Improved on gentle hydration with D5W.  Patient noted to be hypoglycemic and as such IV fluids were changed to D10.  Change IV fluids back to D5 normal saline and monitor blood glucose levels.  Follow.   8.  Hypokalemia Potassium at 4.9.    9.  Hypothyroidism TSH was 12.9. It was noted per admitting physician that patient had not taken her thyroid medication over a month.  Patient noted to be hypothermic.  Synthroid has been increased to 25 MCG's IV daily. Will need repeat thyroid function studies done in about 4 - 6 weeks in the outpatient setting.  10.  Injury documentation Right hip lateral stage II/mid sacrum stage II, medial aspect of left knee Continue current wound care recommendations.  11.  Severe protein calorie malnutrition/probable underlying dementia Patient with severe protein calorie malnutrition.  Patient per RN pocketing pills and refusing oral intake.  Patient with minimal to no oral intake.  Patient however asking for food.  Could likely be secondary to acute infection with probable underlying dementia.  Supportive care.  12.  Bradycardia Beta-blocker has been discontinued.  Synthroid dose has been increased.  Follow.  13.  Hypomagnesemia Repleted.  Magnesium at 2.7.  14.  Hypoglycemia Likely secondary to poor to no oral intake.  Patient was on D5 normal saline which was changed to D10 at 50 cc/h.  CBG of 112 this morning.  Could likely transition to D5 normal saline and follow CBGs.   15.  Goals of care Patient with advanced age with multiple comorbidities, now with COVID-19 pneumonia.  Palliative care was consulted and decision was made to continue aggressive care at this time.  Patient minimally responsive over the past  few days and also had noted to be hypothermic, also with neutropenia and also noted to be bradycardic.   Patient overall with a poor prognosis.  Palliative care reconsulted to reassess.  Will likely need palliative care to follow-up in the outpatient setting once patient is discharged.   DVT prophylaxis: Heparin Code Status: DNR Family Communication: No family at bedside.  Disposition Plan:  . Patient came from: Home            . Anticipated d/c place: To be determined.  Likely back home versus SNF. Marland Kitchen Barriers to d/c OR conditions which need to be met to effect a safe d/c: Improvement with sepsis, hypothermia, neutropenia, pneumonia with concern for questionable CHF, COVID-19 respiratory failure.  Improvement with mental status.  No longer hypoglycemic and off dextrose.   Consultants:   Palliative care: Dr. Domingo Cocking 04/01/2019  Procedures:   CT angiogram chest 03/31/2019  CT abdomen and pelvis 03/30/2019  2D echo 04/01/2019  Chest x-ray 04/07/2019  Antimicrobials  IV cefepime 03/30/2019>>>>> 04/05/2019  IV Flagyl 3/26/2021x1 dose  IV vancomycin 3/26/2021x1 dose   Subjective: Patient patient laying in bed easily arousable.  More alert.  Oriented to self and place thinks is 2022.  However knows Edmon Crape is the president.  Asking for food.  Asking for some collard greens to eat.  Denies any chest pain.  No shortness of breath.  No abdominal pain.  Objective: Vitals:   04/08/19 0829 04/08/19 1400 04/08/19 2005 04/09/19 0517  BP: (!) 146/66 (!) 145/61 132/61 (!) 152/78  Pulse: (!) 50 71 (!) 48 63  Resp: 14 (!) _0 Temp: (!) 97.2 F (36.2 C) 97.6 F (36.4 C) 98.1 F (36.7 C) (!) 97.3 F (36.3 C)  TempSrc: Axillary Axillary Axillary Oral  SpO2: 95% 100% 97% 100%  Weight:      Height:        Intake/Output Summary (Last 24 hours) at 04/09/2019 1230 Last data filed at 04/09/2019 0935 Gross per 24 hour  Intake 982.63 ml  Output 540 ml  Net 442.63 ml   Filed Weights    03/30/19 1757  Weight: 40.8 kg    Examination:  General exam: Cachectic.  Frail.  Alert. Respiratory system: CTA B.  No wheezes, no crackles, no rhonchi.  Normal respiratory effort. Cardiovascular system: Regular rate rhythm no murmurs rubs or gallops.  No JVD.  No lower extremity edema. Gastrointestinal system: Abdomen is soft, nontender, nondistended, positive bowel sounds.  No rebound.  No guarding.     Central nervous system: More alert today. Extremities: No clubbing cyanosis or edema.  Skin: Sacral, right hip, medial aspect of left knee wounds.  Psychiatry: Judgement and insight appear fair. Mood & affect appropriate.             Data Reviewed: I have personally reviewed following labs and imaging studies  CBC: Recent Labs  Lab 04/04/19 0415 04/04/19 0415 04/05/19 0355 04/06/19 0423 04/07/19 0343 04/08/19 0412 04/09/19 0443  WBC 2.2*   < > 2.0* 1.7* 1.5* 1.8* 1.8*  NEUTROABS 1.1*  --  1.5*  --  0.9* 1.2* 1.1*  HGB 8.6*   < > 8.5* 8.4* 8.6* 9.0* 9.1*  HCT 29.6*   < > 29.5* 28.4* 29.5* 30.5* 31.3*  MCV 92.2   < > 91.0 89.6 91.9 90.0 91.8  PLT 830*   < > 835* 930* 1,048* 1,128* 1,106*   < > = values in this interval not displayed.   Basic Metabolic Panel: Recent Labs  Lab 04/05/19 0355 04/06/19 0423 04/07/19 0343 04/08/19 0412 04/08/19 0954 04/09/19 0443  NA 144 143 142 143  --  137  K 3.8 3.5 3.9 3.9  --  4.9  CL 109 108 110 112*  --  108  CO2 _1 --  23  GLUCOSE 126* 133* 83 56*  --  152*  BUN 46* 39* 29* 25*  --  19  CREATININE 1.16* 0.92 0.80 0.68  --  0.77  CALCIUM 7.7* 8.1* 8.0* 8.0*  --  8.1*  MG  --   --   --   --  1.6* 2.7*   GFR: Estimated Creatinine Clearance: 24.7 mL/min (by C-G formula based on SCr of 0.77 mg/dL). Liver Function Tests: No results for input(s): AST, ALT, ALKPHOS, BILITOT, PROT, ALBUMIN in the last 168 hours. No results for input(s): LIPASE, AMYLASE in the last 168 hours. No results for input(s): AMMONIA in  the last 168 hours. Coagulation Profile: No results for input(s): INR, PROTIME in the last 168 hours. Cardiac Enzymes: No results for input(s): CKTOTAL, CKMB, CKMBINDEX, TROPONINI in the last 168 hours. BNP (last 3 results) No results for input(s): PROBNP in the last 8760 hours. HbA1C: No results for input(s): HGBA1C in the last 72 hours. CBG: Recent Labs  Lab 04/08/19 1708 04/08/19 2004 04/09/19 0007 04/09/19 0433 04/09/19 0734  GLUCAP  82 85 116* 139* 112*   Lipid Profile: No results for input(s): CHOL, HDL, LDLCALC, TRIG, CHOLHDL, LDLDIRECT in the last 72 hours. Thyroid Function Tests: No results for input(s): TSH, T4TOTAL, FREET4, T3FREE, THYROIDAB in the last 72 hours. Anemia Panel: No results for input(s): VITAMINB12, FOLATE, FERRITIN, TIBC, IRON, RETICCTPCT in the last 72 hours. Sepsis Labs: Recent Labs  Lab 04/03/19 0459 04/07/19 1130 04/08/19 0412 04/09/19 0443  PROCALCITON 0.66 <0.10 <0.10 <0.10  LATICACIDVEN  --  1.1  --   --     Recent Results (from the past 240 hour(s))  Culture, blood (Routine x 2)     Status: None   Collection Time: 03/30/19  5:30 PM   Specimen: BLOOD  Result Value Ref Range Status   Specimen Description   Final    BLOOD RIGHT ANTECUBITAL Performed at [facility], [place] [address] [place], [place] [address]    Special Requests   Final    BOTTLES DRAWN AEROBIC AND ANAEROBIC Blood Culture results may not be optimal due to an excessive volume of blood received in culture bottles Performed at [facility] [address], [place], [place] [address]    Culture   Final    NO GROWTH 5 DAYS Performed at [facility], [place] [address] [place], [place] [address]    Report Status 04/04/2019 FINAL  Final  Culture, blood (Routine x 2)     Status: None   Collection Time: 03/30/19  5:40 PM   Specimen: BLOOD  Result Value Ref Range Status   Specimen Description   Final    BLOOD LEFT  ANTECUBITAL Performed at [facility] [address], [place], [place] [address]    Special Requests   Final    BOTTLES DRAWN AEROBIC AND ANAEROBIC Blood Culture results may not be optimal due to an excessive volume of blood received in culture bottles Performed at [facility] [address], [place], [place] [address]    Culture   Final    NO GROWTH 5 DAYS Performed at [facility], [place] [address], [place], [place]  [address]    Report Status 04/04/2019 FINAL  Final  Urine culture     Status: None   Collection Time: 03/30/19  6:01 PM   Specimen: In/Out Cath Urine  Result Value Ref Range Status   Specimen Description   Final    IN/OUT CATH URINE Performed at [facility] [address] [place] [address] [place], [place] [address]    Special Requests   Final    NONE Performed at [facility], [place] [address] [place], [place] [address]    Culture   Final    NO GROWTH Performed at [facility], [place] [address], [place], [place] [address]    Report Status 04/01/2019 FINAL  Final  Respiratory Panel by RT PCR (Flu A&B, Covid) - Nasopharyngeal Swab     Status: Abnormal   Collection Time: 03/30/19  7:31 PM   Specimen: Nasopharyngeal Swab  Result Value Ref Range Status   SARS Coronavirus 2 by RT PCR POSITIVE (A) NEGATIVE Final    Comment: RESULT CALLED TO, READ BACK BY AND VERIFIED WITH: N,MOSCINSKI AT 2306 ON 03/30/19 BY A,MOHAMED (NOTE) SARS-CoV-2 target nucleic acids are DETECTED. SARS-CoV-2 RNA is generally detectable in upper respiratory specimens  during the acute phase of infection. Positive results are indicative of the presence of the identified virus, but do not rule  out bacterial infection or co-infection with other pathogens not detected by the test. Clinical correlation with patient history and other diagnostic information is necessary to determine patient infection status. The expected result is  Negative. Fact Sheet for Patients:  PinkCheek.be Fact Sheet for Healthcare Providers: GravelBags.it This test is not yet approved or cleared by the [place] FDA and  has been authorized for detection and/or diagnosis of SARS-CoV-2 by FDA under an Emergency Use Authorization (EUA).  This EUA will remain in effect (meaning this test can be  used) for the duration of  the COVID-19 declaration under Section 564(b)(1) of the Act, 21 U.S.C. section 360bbb-3(b)(1), unless the authorization is terminated or revoked sooner.    Influenza A by PCR NEGATIVE NEGATIVE Final   Influenza B by PCR NEGATIVE NEGATIVE Final    Comment: (NOTE) The Xpert Xpress SARS-CoV-2/FLU/RSV assay is intended as an aid in  the diagnosis of influenza from Nasopharyngeal swab specimens and  should not be used as a sole basis for treatment. Nasal washings and  aspirates are unacceptable for Xpert Xpress SARS-CoV-2/FLU/RSV  testing. Fact Sheet for Patients: PinkCheek.be Fact Sheet for Healthcare Providers: GravelBags.it This test is not yet approved or cleared by the [place] FDA and  has been authorized for detection and/or diagnosis of SARS-CoV-2 by  FDA under an Emergency Use Authorization (EUA). This EUA will remain  in effect (meaning this test can be used) for the duration of the  Covid-19 declaration under Section 564(b)(1) of the Act, 21  U.S.C. section 360bbb-3(b)(1), unless the authorization is  terminated or revoked. Performed at [facility], [place] [address], [place], [place] [address]   MRSA PCR Screening     Status: None   Collection Time: 04/01/19  9:36 PM   Specimen: Nasal Mucosa; Nasopharyngeal  Result Value Ref Range Status   MRSA by PCR NEGATIVE NEGATIVE Final    Comment:        The GeneXpert MRSA Assay (FDA approved for NASAL specimens only), is one  component of a comprehensive MRSA colonization surveillance program. It is not intended to diagnose MRSA infection nor to guide or monitor treatment for MRSA infections. Performed at [facility], [place] [address] [place] [address], [place], [place] [address]   Culture, blood (Routine X 2) w Reflex to ID Panel     Status: None   Collection Time: 04/03/19  3:52 PM   Specimen: BLOOD LEFT FOREARM  Result Value Ref Range Status   Specimen Description   Final    BLOOD LEFT FOREARM Performed at [place] [address] [place], [place] [address]    Special Requests   Final    BOTTLES DRAWN AEROBIC ONLY Blood Culture adequate volume Performed at [facility] [address], [place], [place] [address]    Culture   Final    NO GROWTH 5 DAYS Performed at [facility], [place] [address] [place], [place] [address]    Report Status 04/08/2019 FINAL  Final  Culture, blood (Routine X 2) w Reflex to ID Panel     Status: None   Collection Time: 04/03/19  5:11 PM   Specimen: BLOOD LEFT ARM  Result Value Ref Range Status   Specimen Description   Final    BLOOD LEFT ARM Performed at [facility] [address] [place], [place] [address]    Special Requests   Final    BOTTLES DRAWN AEROBIC ONLY Blood Culture results may  not be optimal due to an inadequate volume of blood received in culture bottles Performed at [facility] [address], [place], [place] [address]    Culture   Final    NO GROWTH 5 DAYS Performed at [facility], [place] [address] [place], [place] [address]    Report Status 04/08/2019 FINAL  Final  Culture, Urine     Status: None   Collection Time: 04/07/19 10:41 AM   Specimen: Urine, Clean Catch  Result Value Ref Range Status   Specimen Description   Final    URINE, CLEAN CATCH Performed at [facility], [place] [address] [place], [place] [address]    Special  Requests   Final    NONE Performed at [facility], [place] [address], [place], [place] [address]    Culture   Final    NO GROWTH Performed at [facility], [place] [address] [place], [place] [address]    Report Status 04/08/2019 FINAL  Final  Culture, blood (Routine X 2) w Reflex to ID Panel     Status: None (Preliminary result)   Collection Time: 04/07/19 11:30 AM   Specimen: BLOOD  Result Value Ref Range Status   Specimen Description   Final    BLOOD RIGHT ANTECUBITAL Performed at [place] [address], [place], [place] [address]    Special Requests   Final    BOTTLES DRAWN AEROBIC AND ANAEROBIC Blood Culture adequate volume Performed at [facility] [address] [place], [place] [address]    Culture   Final    NO GROWTH 2 DAYS Performed at [place] [address], [place], [place] [address]    Report Status PENDING  Incomplete  Culture, blood (Routine X 2) w Reflex to ID Panel     Status: None (Preliminary result)   Collection Time: 04/07/19 11:49 AM   Specimen: BLOOD  Result Value Ref Range Status   Specimen Description   Final    BLOOD RIGHT ANTECUBITAL Performed at [facility] [address] [place], [place] [address]    Special Requests   Final    BOTTLES DRAWN AEROBIC ONLY Blood Culture adequate volume Performed at [facility] [address], [place], [place] [address]    Culture   Final    NO GROWTH 2 DAYS Performed at [facility] [address], [place], [place] [address]    Report Status PENDING  Incomplete         Radiology Studies: No results found.      Scheduled Meds: . vitamin C  500 mg Oral Daily  . citalopram  10 mg Oral Daily  . dexamethasone (DECADRON) injection  6 mg Intravenous Q24H  . enoxaparin (LOVENOX) injection  40 mg Subcutaneous Q24H  . feeding supplement (ENSURE ENLIVE)  237 mL Oral BID BM  .  hydrALAZINE  25 mg Oral Daily  . levothyroxine  25 mcg Intravenous Daily  . multivitamin with minerals  1 tablet Oral Daily  . zinc sulfate  220 mg Oral Daily   Continuous Infusions: . dextrose 50 mL/hr at 04/09/19 0512     LOS: 10 days    Time spent: 40 minutes    Irine Seal, MD [facility]   To contact the attending provider between 7A-7P or the covering provider during after hours 7P-7A, please log into the web site [email or web address] and access using  universal [place] password for that web site. If you do not have the password, please call the hospital operator.  04/09/2019, 12:30 PM

## 2019-04-09 NOTE — Care Management Important Message (Signed)
Important Message  Patient Details IM Letter given to Gabriel Earing RN Case Manager to present to the Patient Name: Angela [name] MRN: [id number] Date of Birth: Dec 21, 1918   Medicare Important Message Given:  Yes     Kerin Salen 04/09/2019, 10:45 AM

## 2019-04-10 LAB — MAGNESIUM: Magnesium: 2.2 mg/dL (ref 1.7–2.4)

## 2019-04-10 LAB — GLUCOSE, CAPILLARY
Glucose-Capillary: 106 mg/dL — ABNORMAL HIGH (ref 70–99)
Glucose-Capillary: 66 mg/dL — ABNORMAL LOW (ref 70–99)
Glucose-Capillary: 74 mg/dL (ref 70–99)
Glucose-Capillary: 82 mg/dL (ref 70–99)
Glucose-Capillary: 86 mg/dL (ref 70–99)
Glucose-Capillary: 88 mg/dL (ref 70–99)
Glucose-Capillary: 91 mg/dL (ref 70–99)
Glucose-Capillary: 93 mg/dL (ref 70–99)
Glucose-Capillary: 94 mg/dL (ref 70–99)

## 2019-04-10 LAB — CBC
HCT: 30.3 % — ABNORMAL LOW (ref 36.0–46.0)
Hemoglobin: 9.1 g/dL — ABNORMAL LOW (ref 12.0–15.0)
MCH: 27.4 pg (ref 26.0–34.0)
MCHC: 30 g/dL (ref 30.0–36.0)
MCV: 91.3 fL (ref 80.0–100.0)
Platelets: 1148 10*3/uL (ref 150–400)
RBC: 3.32 MIL/uL — ABNORMAL LOW (ref 3.87–5.11)
RDW: 21.3 % — ABNORMAL HIGH (ref 11.5–15.5)
WBC: 1.6 10*3/uL — ABNORMAL LOW (ref 4.0–10.5)
nRBC: 0 % (ref 0.0–0.2)

## 2019-04-10 LAB — CBC WITH DIFFERENTIAL/PLATELET

## 2019-04-10 LAB — BASIC METABOLIC PANEL
Anion gap: 8 (ref 5–15)
BUN: 14 mg/dL (ref 8–23)
CO2: 23 mmol/L (ref 22–32)
Calcium: 8.1 mg/dL — ABNORMAL LOW (ref 8.9–10.3)
Chloride: 106 mmol/L (ref 98–111)
Creatinine, Ser: 0.69 mg/dL (ref 0.44–1.00)
GFR calc Af Amer: >60 mL/min (ref >60)
GFR calc non Af Amer: >60 mL/min (ref >60)
Glucose, Bld: 94 mg/dL (ref 70–99)
Potassium: 4.4 mmol/L (ref 3.5–5.1)
Sodium: 137 mmol/L (ref 135–145)

## 2019-04-10 LAB — DIFFERENTIAL
Abs Immature Granulocytes: 0.07 10*3/uL (ref 0.00–0.07)
Basophils Absolute: 0 10*3/uL (ref 0.0–0.1)
Basophils Relative: 0 %
Eosinophils Absolute: 0 10*3/uL (ref 0.0–0.5)
Eosinophils Relative: 0 %
Immature Granulocytes: 4 %
Lymphocytes Relative: 27 %
Lymphs Abs: 0.4 10*3/uL — ABNORMAL LOW (ref 0.7–4.0)
Monocytes Absolute: 0.2 10*3/uL (ref 0.1–1.0)
Monocytes Relative: 12 %
Neutro Abs: 0.9 10*3/uL — ABNORMAL LOW (ref 1.7–7.7)
Neutrophils Relative %: 57 %

## 2019-04-10 NOTE — Progress Notes (Signed)
Hypoglycemic Event  CBG: 66  Treatment: 4 oz juice/soda  Symptoms: None  Follow-up CBG: Time:1405 CBG Result:93  Possible Reasons for Event: Inadequate meal intake  Comments/MD notified:hypoglycemic protocol    Illa Level

## 2019-04-10 NOTE — Progress Notes (Signed)
CRITICAL VALUE ALERT  Critical Value:  Platelets 1148  Date & Time Notied:  04/10/2019 0848  Provider Notified: Dr Grandville Silos   Orders Received/Actions taken: no new orders

## 2019-04-10 NOTE — Progress Notes (Addendum)
PROGRESS NOTE    Angela [name]  SWN:[id number] DOB: [birth year] DOA: 03/30/2019 PCP: [name], MD    Brief Narrative:  Patient is a 86 year old female with history of breast CA status post mastectomy, anemia, anxiety, rheumatoid arthritis, hypothyroidism, recent hospital admission 3/4-3/10/2019 (secondary to multifocal pneumonia, hyponatremia, AKI, acute diastolic CHF, AVNRT) presented to ED with abdominal pain, fevers, 103.1 F, hypotensive BP 88/60, tachycardia 130, tachypnea with respiratory rate of 40.  O2 sats 98% on room air Patient was also found to have wound in the buttocks, sacral region Chest x-ray showed moderate to marked severity left basilar atelectasis and/or infiltrates.  Patient was placed on vancomycin, cefepime, metronidazole. COVID-19 positive   Assessment & Plan:   Principal Problem:   Pneumonia due to COVID-19 virus Active Problems:   Sepsis ([place])   Acute respiratory failure with hypoxia (HCC)   Acute diastolic CHF (congestive heart failure) (HCC)   AKI (acute kidney injury) ([place])   Acute respiratory failure (HCC)   Hypothyroidism   Hypernatremia   Pulmonary embolus (HCC)   Thrombocytosis (HCC)   Hypothermia  1 acute hypoxemic respiratory failure due to acute COVID-19 viral pneumonia, POA/acute PE/sepsis secondary to HCAP Patient presented with systemic inflammatory response syndrome with hypotension, tachycardia tachypnea, COVID-19 was positive.  Chest x-ray showed moderate to marked severe left base atelectasis/infiltrates.  COVID-19 was positive.  CT angiogram chest showed a small pulmonary embolus in the right lower lobe in the segmental pulmonary artery with right heart strain.  Patient pancultured with cultures pending with no growth to date..  Inflammatory markers of D-dimer, CRP, ferritin trending down.  Patient currently afebrile.  Sats of 95 % on room air.  Continue Decadron.  Status post 5 days of IV remdesivir.  Status post 7 days IV cefepime.   On full dose Lovenox. Supportive care.  2.  Hypothermia Patient noted to be hypothermic (04/07/2019).  Patient more alert this morning and following commands however early on in the hospitalization was minimally responsive.  Patient with poor oral intake.  Patient has been pancultured results pending.  Repeat chest x-ray with no significant change from prior study.  Lactic acid level within normal limits.  Procalcitonin levels negative.  Patient Synthroid dose has been increased to 25 MCG's IV daily.  Warm blankets as needed.  Supportive care.   3.  Pulmonary embolism Per CT angiogram chest that showed an acute right lower lobe PE with possible right heart strain.  Unable to get lower extremity Dopplers due to concern for contractures.  2D echo with a EF of 55 to 60%, mildly reduced right ventricular systolic function, moderately elevated PA pressure.  Patient initially placed on IV heparin has been transitioned to full dose Lovenox.  Follow.  4.  Acute diastolic CHF exacerbation 2D echo done from 03/09/2019 with normal LV function, grade 1 diastolic dysfunction.  Concern that patient volume overloaded on admission with significant bilateral lower extremity edema and JVD.  CT angiogram chest done showed a PE with possible right heart strain.  Volume overload improved.  Patient with borderline blood pressure and tachycardia and as such diuretics were held. Patient received a 500 cc bolus of normal saline during the hospitalization.  Beta-blocker discontinued as patient noted to be bradycardic.  Follow.  5 neutropenia/thrombocytosis Likely secondary to acute illness and acute phase reactant.  Patient has been pancultured results pending with no growth to date.  Neutropenia slowly improving.  Thrombocytosis slowly improving.  Procalcitonin is negative.  Lactic acid level at  1.1.  Patient on full dose Lovenox for acute PE.  No need for any further antibiotics at this time.  Supportive care.    6.  Acute kidney  injury Felt secondary to possible acute CHF, hypotension with decreased renal perfusion and COVID-19 pneumonia.  Creatinine currently at 0.77.  Follow.  7.  Hypernatremia Likely secondary to poor oral intake.  Patient noted to be on diuretics which have been discontinued.  Initially improved on D5W however had to be changed to D10 due to ongoing hypoglycemia.  Will change to D5 normal saline.  As oral intake improves could likely discontinue D5 and monitor CBGs.  Follow.   8.  Hypokalemia Potassium at 4.4.    9.  Hypothyroidism TSH was 12.9. It was noted per admitting physician that patient had not taken her thyroid medication over a month.  Patient noted to be hypothermic.  Synthroid has been increased to 25 MCG's IV daily. Will need repeat thyroid function studies done in about 4 - 6 weeks in the outpatient setting.  10.  Injury documentation Right hip lateral stage II/mid sacrum stage II, medial aspect of left knee Continue current wound care recommendations.  11.  Severe protein calorie malnutrition/probable underlying dementia Patient with severe protein calorie malnutrition.  Patient per RN pocketing pills and refusing oral intake.  Patient with minimal to no oral intake.  Patient however asking for food.  Could likely be secondary to acute infection with probable underlying dementia.  Supportive care.  12.  Bradycardia Beta-blocker has been discontinued.  Synthroid dose has been increased.  Follow.  13.  Hypomagnesemia Repleted.  Magnesium at 2.2.  14.  Hypoglycemia Likely secondary to poor to no oral intake.  Patient was on D5 normal saline which was changed to D10 at 50 cc/h.  CBG of 66 this morning.  We will transition from D10-D5 normal saline.  CBGs improved could likely transition to saline.   15.  Goals of care Patient with advanced age with multiple comorbidities, now with COVID-19 pneumonia.  Palliative care was consulted and decision was made to continue aggressive care  at this time.  Patient minimally responsive over the past few days and also had noted to be hypothermic, also with neutropenia and also noted to be bradycardic.   Patient overall with a poor prognosis.  Palliative care reconsulted to reassess.  Will likely need palliative care to follow-up in the outpatient setting once patient is discharged.   DVT prophylaxis: Heparin Code Status: DNR Family Communication: Updated son, [name] via telephone. Disposition Plan:  . Patient came from: Home            . Anticipated d/c place: To be determined.  Likely back home versus SNF. Marland Kitchen Barriers to d/c OR conditions which need to be met to effect a safe d/c: Improvement with sepsis, hypothermia, neutropenia, pneumonia with concern for questionable CHF, COVID-19 respiratory failure.  Improvement with mental status.  No longer hypoglycemic and off dextrose.   Consultants:   Palliative care: Dr. Domingo Cocking 04/01/2019  Procedures:   CT angiogram chest 03/31/2019  CT abdomen and pelvis 03/30/2019  2D echo 04/01/2019  Chest x-ray 04/07/2019  Antimicrobials  IV cefepime 03/30/2019>>>>> 04/05/2019  IV Flagyl 3/26/2021x1 dose  IV vancomycin 3/26/2021x1 dose   Subjective: Patient laying in bed alert.  Denies any chest pain.  No shortness of breath.  Asking for collard greens to eat.  More interactive.  Denies any abdominal pain.    Objective: Vitals:   04/09/19 0517 04/09/19  1237 04/09/19 2108 04/10/19 0441  BP: (!) 152/78 (!) 152/76 (!) 150/77 (!) 151/64  Pulse: 63 (!) 56 63 (!) 54  Resp: 19 19 20 18   Temp: (!) 97.3 F (36.3 C) (!) 97.4 F (36.3 C) 97.8 F (36.6 C) (!) 97.5 F (36.4 C)  TempSrc: Oral Oral Oral Oral  SpO2: 100% 99% 95% 97%  Weight:      Height:       No intake or output data in the 24 hours ending 04/10/19 1217 Filed Weights   03/30/19 1757  Weight: 40.8 kg    Examination:  General exam: Alert. Respiratory system: Lungs clear to auscultation bilaterally anterior lung fields.   No wheezes, no crackles, no rhonchi.  Normal respiratory effort.  Cardiovascular system: RRR no murmurs rubs or gallops.  No JVD.  No lower extremity edema.   Gastrointestinal system: Abdomen is nontender, nondistended, soft, positive bowel sounds.  No rebound.  No guarding.      Central nervous system: More alert today. Extremities: No clubbing cyanosis or edema.  Skin: Sacral, right hip, medial aspect of left knee wounds.  Psychiatry: Judgement and insight appear fair. Mood & affect appropriate.             Data Reviewed: I have personally reviewed following labs and imaging studies  CBC: Recent Labs  Lab 04/07/19 0343 04/08/19 0412 04/09/19 0443 04/10/19 0431 04/10/19 0745  WBC 1.5* 1.8* 1.8* QUESTIONABLE RESULTS, RECOMMEND RECOLLECT TO VERIFY 1.6*  NEUTROABS 0.9* 1.2* 1.1* QUESTIONABLE RESULTS, RECOMMEND RECOLLECT TO VERIFY 0.9*  HGB 8.6* 9.0* 9.1* QUESTIONABLE RESULTS, RECOMMEND RECOLLECT TO VERIFY 9.1*  HCT 29.5* 30.5* 31.3* QUESTIONABLE RESULTS, RECOMMEND RECOLLECT TO VERIFY 30.3*  MCV 91.9 90.0 91.8 QUESTIONABLE RESULTS, RECOMMEND RECOLLECT TO VERIFY 91.3  PLT 1,048* 1,128* 1,106* QUESTIONABLE RESULTS, RECOMMEND RECOLLECT TO VERIFY 9,030*   Basic Metabolic Panel: Recent Labs  Lab 04/07/19 0343 04/08/19 0412 04/08/19 0954 04/09/19 0443 04/10/19 0431 04/10/19 0745  NA 142 143  --  137 QUESTIONABLE RESULTS, RECOMMEND RECOLLECT TO VERIFY 137  K 3.9 3.9  --  4.9 QUESTIONABLE RESULTS, RECOMMEND RECOLLECT TO VERIFY 4.4  CL 110 112*  --  108 QUESTIONABLE RESULTS, RECOMMEND RECOLLECT TO VERIFY 106  CO2 24 23  --  23 QUESTIONABLE RESULTS, RECOMMEND RECOLLECT TO VERIFY 23  GLUCOSE 83 56*  --  152* QUESTIONABLE RESULTS, RECOMMEND RECOLLECT TO VERIFY 94  BUN 29* 25*  --  19 QUESTIONABLE RESULTS, RECOMMEND RECOLLECT TO VERIFY 14  CREATININE 0.80 0.68  --  0.77 QUESTIONABLE RESULTS, RECOMMEND RECOLLECT TO VERIFY 0.69  CALCIUM 8.0* 8.0*  --  8.1* QUESTIONABLE RESULTS,  RECOMMEND RECOLLECT TO VERIFY 8.1*  MG  --   --  1.6* 2.7* QUESTIONABLE RESULTS, RECOMMEND RECOLLECT TO VERIFY 2.2   GFR: Estimated Creatinine Clearance: 24.7 mL/min (by C-G formula based on SCr of 0.69 mg/dL). Liver Function Tests: No results for input(s): AST, ALT, ALKPHOS, BILITOT, PROT, ALBUMIN in the last 168 hours. No results for input(s): LIPASE, AMYLASE in the last 168 hours. No results for input(s): AMMONIA in the last 168 hours. Coagulation Profile: No results for input(s): INR, PROTIME in the last 168 hours. Cardiac Enzymes: No results for input(s): CKTOTAL, CKMB, CKMBINDEX, TROPONINI in the last 168 hours. BNP (last 3 results) No results for input(s): PROBNP in the last 8760 hours. HbA1C: No results for input(s): HGBA1C in the last 72 hours. CBG: Recent Labs  Lab 04/10/19 0005 04/10/19 0438 04/10/19 0548 04/10/19 0801 04/10/19 1158  GLUCAP 94 106*  91 74 66*   Lipid Profile: No results for input(s): CHOL, HDL, LDLCALC, TRIG, CHOLHDL, LDLDIRECT in the last 72 hours. Thyroid Function Tests: No results for input(s): TSH, T4TOTAL, FREET4, T3FREE, THYROIDAB in the last 72 hours. Anemia Panel: No results for input(s): VITAMINB12, FOLATE, FERRITIN, TIBC, IRON, RETICCTPCT in the last 72 hours. Sepsis Labs: Recent Labs  Lab 04/07/19 1130 04/08/19 0412 04/09/19 0443  PROCALCITON <0.10 <0.10 <0.10  LATICACIDVEN 1.1  --   --     Recent Results (from the past 240 hour(s))  MRSA PCR Screening     Status: None   Collection Time: 04/01/19  9:36 PM   Specimen: Nasal Mucosa; Nasopharyngeal  Result Value Ref Range Status   MRSA by PCR NEGATIVE NEGATIVE Final    Comment:        The GeneXpert MRSA Assay (FDA approved for NASAL specimens only), is one component of a comprehensive MRSA colonization surveillance program. It is not intended to diagnose MRSA infection nor to guide or monitor treatment for MRSA infections. Performed at [facility], [place] [address], [place], [place] [address]   Culture, blood (Routine X 2) w Reflex to ID Panel     Status: None   Collection Time: 04/03/19  3:52 PM   Specimen: BLOOD LEFT FOREARM  Result Value Ref Range Status   Specimen Description   Final    BLOOD LEFT FOREARM Performed at [facility] [address], [place], [place] [address]    Special Requests   Final    BOTTLES DRAWN AEROBIC ONLY Blood Culture adequate volume Performed at [facility] [address], [place], [place] [address]    Culture   Final    NO GROWTH 5 DAYS Performed at [facility], [place] [address], [place], [place] [address]    Report Status 04/08/2019 FINAL  Final  Culture, blood (Routine X 2) w Reflex to ID Panel     Status: None   Collection Time: 04/03/19  5:11 PM   Specimen: BLOOD LEFT ARM  Result Value Ref Range Status   Specimen Description   Final    BLOOD LEFT ARM Performed at [facility] [address], [place], [place] [address]    Special Requests   Final    BOTTLES DRAWN AEROBIC ONLY Blood Culture results may not be optimal due to an inadequate volume of blood received in culture bottles Performed at [facility] [address] [place], [place] [address]    Culture   Final    NO GROWTH 5 DAYS Performed at [facility], [place] [address] [place], [place] [address]    Report Status 04/08/2019 FINAL  Final  Culture, Urine     Status: None   Collection Time: 04/07/19 10:41 AM   Specimen: Urine, Clean Catch  Result Value Ref Range Status   Specimen Description   Final    URINE, CLEAN CATCH Performed at [facility], [place] [address] [place], [place] [address]    Special Requests   Final    NONE Performed at [facility], [place] [address] [place], [place] [address]    Culture   Final    NO GROWTH Performed at [facility], [place] [address] [place], [place]  [address]    Report Status 04/08/2019 FINAL  Final  Culture, blood (Routine X 2) w Reflex to ID Panel     Status: None (  Preliminary result)   Collection Time: 04/07/19 11:30 AM   Specimen: BLOOD  Result Value Ref Range Status   Specimen Description   Final    BLOOD RIGHT ANTECUBITAL Performed at [facility] [address], [place], [place] [address]    Special Requests   Final    BOTTLES DRAWN AEROBIC AND ANAEROBIC Blood Culture adequate volume Performed at [facility] [address] [place], [place] [address]    Culture   Final    NO GROWTH 3 DAYS Performed at [facility], [place] [address], [place], [place] [address]    Report Status PENDING  Incomplete  Culture, blood (Routine X 2) w Reflex to ID Panel     Status: None (Preliminary result)   Collection Time: 04/07/19 11:49 AM   Specimen: BLOOD  Result Value Ref Range Status   Specimen Description   Final    BLOOD RIGHT ANTECUBITAL Performed at [facility] [address] [place], [place] [address]    Special Requests   Final    BOTTLES DRAWN AEROBIC ONLY Blood Culture adequate volume Performed at [facility] [address] [place], [place] [address]    Culture   Final    NO GROWTH 3 DAYS Performed at [facility], [place] [address], [place], [place] [address]    Report Status PENDING  Incomplete         Radiology Studies: No results found.      Scheduled Meds: . vitamin C  500 mg Oral Daily  . citalopram  10 mg Oral Daily  . dexamethasone (DECADRON) injection  6 mg Intravenous Q24H  . enoxaparin (LOVENOX) injection  40 mg Subcutaneous Q24H  . feeding supplement (ENSURE ENLIVE)  237 mL Oral BID BM  . hydrALAZINE  25 mg Oral Daily  . levothyroxine  25 mcg Intravenous Daily  . multivitamin with minerals  1 tablet Oral Daily  . zinc sulfate  220 mg Oral Daily   Continuous Infusions: . dextrose 5 % and 0.9% NaCl 50  mL/hr at 04/09/19 2117     LOS: 11 days    Time spent: 40 minutes    Irine Seal, MD [facility]   To contact the attending provider between 7A-7P or the covering provider during after hours 7P-7A, please log into the web site [email or web address] and access using universal [place] password for that web site. If you do not have the password, please call the hospital operator.  04/10/2019, 12:17 PM

## 2019-04-11 LAB — CBC WITH DIFFERENTIAL/PLATELET
Abs Immature Granulocytes: 0.13 10*3/uL — ABNORMAL HIGH (ref 0.00–0.07)
Basophils Absolute: 0 10*3/uL (ref 0.0–0.1)
Basophils Relative: 1 %
Eosinophils Absolute: 0 10*3/uL (ref 0.0–0.5)
Eosinophils Relative: 0 %
HCT: 33.3 % — ABNORMAL LOW (ref 36.0–46.0)
Hemoglobin: 9.6 g/dL — ABNORMAL LOW (ref 12.0–15.0)
Immature Granulocytes: 6 %
Lymphocytes Relative: 20 %
Lymphs Abs: 0.4 10*3/uL — ABNORMAL LOW (ref 0.7–4.0)
MCH: 26.2 pg (ref 26.0–34.0)
MCHC: 28.8 g/dL — ABNORMAL LOW (ref 30.0–36.0)
MCV: 91 fL (ref 80.0–100.0)
Monocytes Absolute: 0.2 10*3/uL (ref 0.1–1.0)
Monocytes Relative: 11 %
Neutro Abs: 1.4 10*3/uL — ABNORMAL LOW (ref 1.7–7.7)
Neutrophils Relative %: 62 %
Platelets: 1136 10*3/uL (ref 150–400)
RBC: 3.66 MIL/uL — ABNORMAL LOW (ref 3.87–5.11)
RDW: 21.5 % — ABNORMAL HIGH (ref 11.5–15.5)
WBC: 2.2 10*3/uL — ABNORMAL LOW (ref 4.0–10.5)
nRBC: 0 % (ref 0.0–0.2)

## 2019-04-11 LAB — GLUCOSE, CAPILLARY
Glucose-Capillary: 83 mg/dL (ref 70–99)
Glucose-Capillary: 77 mg/dL (ref 70–99)
Glucose-Capillary: 89 mg/dL (ref 70–99)

## 2019-04-11 LAB — BASIC METABOLIC PANEL
Anion gap: 10 (ref 5–15)
BUN: 12 mg/dL (ref 8–23)
CO2: 22 mmol/L (ref 22–32)
Calcium: 8.1 mg/dL — ABNORMAL LOW (ref 8.9–10.3)
Chloride: 106 mmol/L (ref 98–111)
Creatinine, Ser: 0.53 mg/dL (ref 0.44–1.00)
GFR calc Af Amer: >60 mL/min (ref >60)
GFR calc non Af Amer: >60 mL/min (ref >60)
Glucose, Bld: 108 mg/dL — ABNORMAL HIGH (ref 70–99)
Potassium: 4.2 mmol/L (ref 3.5–5.1)
Sodium: 138 mmol/L (ref 135–145)

## 2019-04-11 NOTE — Progress Notes (Signed)
[place] for LMWH Indication: pulmonary embolus  Allergies  Allergen Reactions  . Penicillins     REACTION: Question of penicillin allergy Did it involve swelling of the face/tongue/throat, SOB, or low BP? Unknown Did it involve sudden or severe rash/hives, skin peeling, or any reaction on the inside of your mouth or nose? Unknown Did you need to seek medical attention at a hospital or doctor's office? Unknown When did it last happen?Unknown If all above answers are "NO", may proceed with cephalosporin use.    Patient Measurements: Height: 5\' 3"  (160 cm) Weight: (bed not zeroed. Pt is total) IBW/kg (Calculated) : 52.4  Vital Signs: Temp: 97.4 F (36.3 C) (04/07 0424) Temp Source: Oral (04/07 0424) BP: 167/87 (04/07 0424) Pulse Rate: 69 (04/07 0424)  Labs: Recent Labs    04/10/19 0431 04/10/19 0431 04/10/19 0745 04/11/19 0449  HGB QUESTIONABLE RESULTS, RECOMMEND RECOLLECT TO VERIFY   < > 9.1* 9.6*  HCT QUESTIONABLE RESULTS, RECOMMEND RECOLLECT TO VERIFY  --  30.3* 33.3*  PLT QUESTIONABLE RESULTS, RECOMMEND RECOLLECT TO VERIFY  --  1,148* 1,136*  CREATININE QUESTIONABLE RESULTS, RECOMMEND RECOLLECT TO VERIFY  --  0.69 0.53   < > = values in this interval not displayed.   Estimated Creatinine Clearance: 24.7 mL/min (by C-G formula based on SCr of 0.53 mg/dL).  Assessment: 86 yo female with COVID PNA, ddimer > 5, now found to have a PE per CT scan.  Transitioned to Full dose LMWH for PE treatment on 4/1 - pharmacy to dose.  Pt unable/unwilling to take oral medicines.    Today, 04/11/2019  CBC: Hgb low but stable; Plt elevated (1136)  SCr WNL, CrCl ~ 25 ml/min  No bleeding reported   Plan:   Continue enoxaparin 40 mg (1 mg/kg) subQ daily   Check CBC q72 hours   Lenis Noon, PharmD 04/11/19 7:11 AM

## 2019-04-11 NOTE — Progress Notes (Signed)
PROGRESS NOTE    Angela [name]  [id number] DOB: 1918/09/21 DOA: 03/30/2019 PCP: [name], MD   Brief Narrative: 86 year old female with history of breast CA status post mastectomy, anemia, anxiety, rheumatoid arthritis, hypothyroidism, recent hospital admission 3/4-3/10/2019 (secondary to multifocal pneumonia, hyponatremia, AKI, acute diastolic CHF, AVNRT) presented to ED with abdominal pain, fevers, 103.1 F, hypotensive BP 88/60, tachycardia 130, tachypnea with respiratory rate of 40. O2 sats 98% on room air Patient was also found to have wound in the buttocks, sacral region Chest x-ray showed moderate to marked severity left basilar atelectasis and/or infiltrates. Patient was placed on vancomycin, cefepime, metronidazole. COVID-19 positive   Assessment & Plan:   Principal Problem:   Pneumonia due to COVID-19 virus Active Problems:   Sepsis ([place])   Acute respiratory failure with hypoxia (HCC)   Acute diastolic CHF (congestive heart failure) (HCC)   AKI (acute kidney injury) ([place])   Acute respiratory failure (HCC)   Hypothyroidism   Hypernatremia   Pulmonary embolus (HCC)   Thrombocytosis (HCC)   Hypothermia   #1 acute hypoxic respiratory failure secondary to COVID-19 pneumonia/healthcare associated pneumonia/acute PE- She is status post Decadron for 13 days stopped 04/11/2019. Status post remdesivir for 5 days. Status post cefepime for 7 days. On full dose Lovenox for right lower lobe PE with right heart strain.  #2 failure to thrive with severe protein calorie malnutrition-with minimal p.o. intake.  Patient refusing p.o. intake and pocketing pills.  #3 acute diastolic heart failure resolved.  Echo with normal ejection fraction.  Not on any diuretics due to soft blood pressure.  Beta-blocker discontinued due to bradycardia.  #4 AKI resolved creatinine 0.53  #5 hypernatremia resolved with IV fluids sodium 138  #6 hypoglycemia due to poor p.o. intake.  Patient  is on D5 CBG today  #7 hypothyroidism Synthroid dose was increased to 25 MCG.  TSH was 12.9.  Repeat TSH in 4 weeks.  #8 hypokalemia resolved  #9 bradycardia resolved off beta-blockers  #10 hypomagnesemia resolved magnesium 2.2  #11 goals of care patient with advanced age and multiple comorbidities now with COVID-19 pneumonia poor nutritional and functional status with poor prognosis.    Pressure Injury 03/10/19 Hip Right;Lateral Stage 2 -  Partial thickness loss of dermis presenting as a shallow open injury with a red, pink wound bed without slough. (Active)  03/10/19 0800  Location: Hip  Location Orientation: Right;Lateral  Staging: Stage 2 -  Partial thickness loss of dermis presenting as a shallow open injury with a red, pink wound bed without slough.  Wound Description (Comments):   Present on Admission: Yes     Pressure Injury 03/10/19 Sacrum Mid Stage 2 -  Partial thickness loss of dermis presenting as a shallow open injury with a red, pink wound bed without slough. (Active)  03/10/19 0800  Location: Sacrum  Location Orientation: Mid  Staging: Stage 2 -  Partial thickness loss of dermis presenting as a shallow open injury with a red, pink wound bed without slough.  Wound Description (Comments):   Present on Admission:      Pressure Injury 03/31/19 Knee Anterior;Left Stage 2 -  Partial thickness loss of dermis presenting as a shallow open injury with a red, pink wound bed without slough. (Active)  03/31/19 1730  Location: Knee  Location Orientation: Anterior;Left  Staging: Stage 2 -  Partial thickness loss of dermis presenting as a shallow open injury with a red, pink wound bed without slough.  Wound Description (Comments):  Present on Admission: Yes      Nutrition Problem: Increased nutrient needs Etiology: wound healing, acute illness, catabolic [id number] infection)     Signs/Symptoms: estimated needs    Interventions: Ensure Enlive (each supplement  provides 350kcal and 20 grams of protein), Magic cup, MVI  Estimated body mass index is 15.93 kg/m as calculated from the following:   Height as of this encounter: 5\' 3"  (1.6 m).   Weight as of this encounter: 40.8 kg.  DVT prophylaxis: Lovenox 40 mg daily  code Status DO NOT RESUSCITATE Family Communication:  Disposition Plan: Patient came from home Likely will need SNF on discharge however to be determined Barrier to discharge ongoing hypoglycemia diminished p.o. intake with failure to thrive on D5 IV drip Consultants:   Palliative care  Procedures: None Antimicrobials:vanco/flagyl/cefepime  Subjective:  Resting in bed in nad Awake  Objective: Vitals:   04/10/19 2125 04/10/19 2331 04/11/19 0424 04/11/19 1221  BP: (!) 155/105 (!) 158/78 (!) 167/87 (!) 153/70  Pulse: 72 79 69 61  Resp: 16 16 16 11   Temp: 97.9 F (36.6 C) 97.6 F (36.4 C) (!) 97.4 F (36.3 C) (!) 97.3 F (36.3 C)  TempSrc:  Oral Oral Oral  SpO2: 93% 92% 95% 95%  Weight:      Height:        Intake/Output Summary (Last 24 hours) at 04/11/2019 1235 Last data filed at 04/11/2019 0615 Gross per 24 hour  Intake 1153.24 ml  Output 400 ml  Net 753.24 ml   Filed Weights   03/30/19 1757  Weight: 40.8 kg    Examination:  General exam: Appears calm and comfortable  Respiratory system: Clear to auscultation. Respiratory effort normal. Cardiovascular system: S1 & S2 heard, RRR. No JVD, murmurs, rubs, gallops or clicks. No pedal edema. Gastrointestinal system: Abdomen is nondistended, soft and nontender. No organomegaly or masses felt. Normal bowel sounds heard. Central nervous system: Alert and oriented. No focal neurological deficits. Extremities: Symmetric 5 x 5 power. Skin: No rashes, lesions or ulcers Psychiatry: Judgement and insight appear normal. Mood & affect appropriate.     Data Reviewed: I have personally reviewed following labs and imaging studies  CBC: Recent Labs  Lab 04/08/19 0412  04/09/19 0443 04/10/19 0431 04/10/19 0745 04/11/19 0449  WBC 1.8* 1.8* QUESTIONABLE RESULTS, RECOMMEND RECOLLECT TO VERIFY 1.6* 2.2*  NEUTROABS 1.2* 1.1* QUESTIONABLE RESULTS, RECOMMEND RECOLLECT TO VERIFY 0.9* 1.4*  HGB 9.0* 9.1* QUESTIONABLE RESULTS, RECOMMEND RECOLLECT TO VERIFY 9.1* 9.6*  HCT 30.5* 31.3* QUESTIONABLE RESULTS, RECOMMEND RECOLLECT TO VERIFY 30.3* 33.3*  MCV 90.0 91.8 QUESTIONABLE RESULTS, RECOMMEND RECOLLECT TO VERIFY 91.3 91.0  PLT 1,128* 1,106* QUESTIONABLE RESULTS, RECOMMEND RECOLLECT TO VERIFY 1,148* 0000000*   Basic Metabolic Panel: Recent Labs  Lab 04/08/19 0412 04/08/19 0954 04/09/19 0443 04/10/19 0431 04/10/19 0745 04/11/19 0449  NA 143  --  137 QUESTIONABLE RESULTS, RECOMMEND RECOLLECT TO VERIFY 137 138  K 3.9  --  4.9 QUESTIONABLE RESULTS, RECOMMEND RECOLLECT TO VERIFY 4.4 4.2  CL 112*  --  108 QUESTIONABLE RESULTS, RECOMMEND RECOLLECT TO VERIFY 106 106  CO2 23  --  23 QUESTIONABLE RESULTS, RECOMMEND RECOLLECT TO VERIFY 23 22  GLUCOSE 56*  --  152* QUESTIONABLE RESULTS, RECOMMEND RECOLLECT TO VERIFY 94 108*  BUN 25*  --  19 QUESTIONABLE RESULTS, RECOMMEND RECOLLECT TO VERIFY 14 12  CREATININE 0.68  --  0.77 QUESTIONABLE RESULTS, RECOMMEND RECOLLECT TO VERIFY 0.69 0.53  CALCIUM 8.0*  --  8.1* QUESTIONABLE RESULTS, RECOMMEND RECOLLECT TO VERIFY  8.1* 8.1*  MG  --  1.6* 2.7* QUESTIONABLE RESULTS, RECOMMEND RECOLLECT TO VERIFY 2.2  --    GFR: Estimated Creatinine Clearance: 24.7 mL/min (by C-G formula based on SCr of 0.53 mg/dL). Liver Function Tests: No results for input(s): AST, ALT, ALKPHOS, BILITOT, PROT, ALBUMIN in the last 168 hours. No results for input(s): LIPASE, AMYLASE in the last 168 hours. No results for input(s): AMMONIA in the last 168 hours. Coagulation Profile: No results for input(s): INR, PROTIME in the last 168 hours. Cardiac Enzymes: No results for input(s): CKTOTAL, CKMB, CKMBINDEX, TROPONINI in the last 168 hours. BNP (last 3  results) No results for input(s): PROBNP in the last 8760 hours. HbA1C: No results for input(s): HGBA1C in the last 72 hours. CBG: Recent Labs  Lab 04/10/19 1637 04/10/19 2019 04/10/19 2332 04/11/19 0422 04/11/19 1011  GLUCAP 82 88 86 83 77   Lipid Profile: No results for input(s): CHOL, HDL, LDLCALC, TRIG, CHOLHDL, LDLDIRECT in the last 72 hours. Thyroid Function Tests: No results for input(s): TSH, T4TOTAL, FREET4, T3FREE, THYROIDAB in the last 72 hours. Anemia Panel: No results for input(s): VITAMINB12, FOLATE, FERRITIN, TIBC, IRON, RETICCTPCT in the last 72 hours. Sepsis Labs: Recent Labs  Lab 04/07/19 1130 04/08/19 0412 04/09/19 0443  PROCALCITON <0.10 <0.10 <0.10  LATICACIDVEN 1.1  --   --     Recent Results (from the past 240 hour(s))  MRSA PCR Screening     Status: None   Collection Time: 04/01/19  9:36 PM   Specimen: Nasal Mucosa; Nasopharyngeal  Result Value Ref Range Status   MRSA by PCR NEGATIVE NEGATIVE Final    Comment:        The GeneXpert MRSA Assay (FDA approved for NASAL specimens only), is one component of a comprehensive MRSA colonization surveillance program. It is not intended to diagnose MRSA infection nor to guide or monitor treatment for MRSA infections. Performed at [facility], [place] [address] [place], [place] [address]   Culture, blood (Routine X 2) w Reflex to ID Panel     Status: None   Collection Time: 04/03/19  3:52 PM   Specimen: BLOOD LEFT FOREARM  Result Value Ref Range Status   Specimen Description   Final    BLOOD LEFT FOREARM Performed at [facility] [address] [place], [place] [address]    Special Requests   Final    BOTTLES DRAWN AEROBIC ONLY Blood Culture adequate volume Performed at [facility] [address], [place], [place] [address]    Culture   Final    NO GROWTH 5 DAYS Performed at [facility], [place] [address], [place], [place]  [address]    Report Status 04/08/2019 FINAL  Final  Culture, blood (Routine X 2) w Reflex to ID Panel     Status: None   Collection Time: 04/03/19  5:11 PM   Specimen: BLOOD LEFT ARM  Result Value Ref Range Status   Specimen Description   Final    BLOOD LEFT ARM Performed at [facility] [address], [place], [place] [address]    Special Requests   Final    BOTTLES DRAWN AEROBIC ONLY Blood Culture results may not be optimal due to an inadequate volume of blood received in culture bottles Performed at [facility] [address], [place], [place] [address]    Culture   Final    NO GROWTH 5 DAYS Performed at [facility]  Lab, [address] [place], [place] [address]    Report Status 04/08/2019 FINAL  Final  Culture, Urine     Status: None   Collection Time: 04/07/19 10:41 AM   Specimen: Urine, Clean Catch  Result Value Ref Range Status   Specimen Description   Final    URINE, CLEAN CATCH Performed at [facility], [place] [address], [place], [place] [address]    Special Requests   Final    NONE Performed at [facility], [place] [address], [place], [place] [address]    Culture   Final    NO GROWTH Performed at [facility], [place] [address] [place], [place] [address]    Report Status 04/08/2019 FINAL  Final  Culture, blood (Routine X 2) w Reflex to ID Panel     Status: None (Preliminary result)   Collection Time: 04/07/19 11:30 AM   Specimen: BLOOD  Result Value Ref Range Status   Specimen Description   Final    BLOOD RIGHT ANTECUBITAL Performed at [facility] [address] [place], [place] [address]    Special Requests   Final    BOTTLES DRAWN AEROBIC AND ANAEROBIC Blood Culture adequate volume Performed at [facility] [address], [place], [place] [address]    Culture   Final    NO GROWTH 4 DAYS Performed at [facility], [place]  [address], [place], [place] [address]    Report Status PENDING  Incomplete  Culture, blood (Routine X 2) w Reflex to ID Panel     Status: None (Preliminary result)   Collection Time: 04/07/19 11:49 AM   Specimen: BLOOD  Result Value Ref Range Status   Specimen Description   Final    BLOOD RIGHT ANTECUBITAL Performed at [facility] [address], [place], [place] [address]    Special Requests   Final    BOTTLES DRAWN AEROBIC ONLY Blood Culture adequate volume Performed at [facility] [address] [place], [place] [address]    Culture   Final    NO GROWTH 4 DAYS Performed at [facility], [place] [address] [place],  [address]    Report Status PENDING  Incomplete         Radiology Studies: No results found.      Scheduled Meds: . vitamin C  500 mg Oral Daily  . citalopram  10 mg Oral Daily  . dexamethasone (DECADRON) injection  6 mg Intravenous Q24H  . enoxaparin (LOVENOX) injection  40 mg Subcutaneous Q24H  . feeding supplement (ENSURE ENLIVE)  237 mL Oral BID BM  . hydrALAZINE  25 mg Oral Daily  . levothyroxine  25 mcg Intravenous Daily  . multivitamin with minerals  1 tablet Oral Daily  . zinc sulfate  220 mg Oral Daily   Continuous Infusions: . dextrose 5 % and 0.9% NaCl 50 mL/hr at 04/11/19 0600     LOS: 12 days     Georgette Shell, MD  04/11/2019, 12:35 PM

## 2019-04-11 NOTE — TOC Progression Note (Signed)
Transition of Care [facility]) - Progression Note    Patient Details  Name: MARCINE MOOREFIELD MRN: [id number] Date of Birth: Jun 29, 1918  [facility]) CM/SW Contact  Purcell Mouton, RN Phone Number: 04/11/2019, 4:01 PM  Clinical Narrative:    Spoke with pt's son [name] concerning bed offers for pt. [place], [place] and [place]. Also text son SNF's. [name] will get back with me in the AM after talking to his sister.         Expected Discharge Plan and Services                                                 Social Determinants of Health (SDOH) Interventions    Readmission Risk Interventions No flowsheet data found.

## 2019-04-11 NOTE — Plan of Care (Signed)
  Problem: Clinical Measurements: Goal: Respiratory complications will improve Outcome: Progressing Goal: Cardiovascular complication will be avoided Outcome: Progressing   Problem: Elimination: Goal: Will not experience complications related to bowel motility Outcome: Progressing   Problem: Safety: Goal: Ability to remain free from injury will improve Outcome: Progressing   

## 2019-04-11 NOTE — Progress Notes (Signed)
Patient had an incontinent episode of stool earlier in the shift and it was hard to tell if the patient voided. Bladder scan showed 338 ml.  In and out performed per orders. 400 ml of cloudy urine returned. Will continue to monitor closely.

## 2019-04-12 DIAGNOSIS — E162 Hypoglycemia, unspecified: Secondary | ICD-10-CM

## 2019-04-12 LAB — GLUCOSE, CAPILLARY
Glucose-Capillary: 141 mg/dL — ABNORMAL HIGH (ref 70–99)
Glucose-Capillary: 147 mg/dL — ABNORMAL HIGH (ref 70–99)
Glucose-Capillary: 159 mg/dL — ABNORMAL HIGH (ref 70–99)
Glucose-Capillary: 208 mg/dL — ABNORMAL HIGH (ref 70–99)
Glucose-Capillary: 246 mg/dL — ABNORMAL HIGH (ref 70–99)
Glucose-Capillary: 65 mg/dL — ABNORMAL LOW (ref 70–99)
Glucose-Capillary: 65 mg/dL — ABNORMAL LOW (ref 70–99)
Glucose-Capillary: 68 mg/dL — ABNORMAL LOW (ref 70–99)
Glucose-Capillary: 68 mg/dL — ABNORMAL LOW (ref 70–99)
Glucose-Capillary: 68 mg/dL — ABNORMAL LOW (ref 70–99)
Glucose-Capillary: 69 mg/dL — ABNORMAL LOW (ref 70–99)
Glucose-Capillary: 72 mg/dL (ref 70–99)
Glucose-Capillary: 79 mg/dL (ref 70–99)

## 2019-04-12 LAB — CULTURE, BLOOD (ROUTINE X 2)
Culture: NO GROWTH
Culture: NO GROWTH
Special Requests: ADEQUATE
Special Requests: ADEQUATE

## 2019-04-12 MED ORDER — DEXTROSE 50 % IV SOLN
INTRAVENOUS | Status: AC
  Administered 2019-04-12: 50 mL
  Filled 2019-04-12: qty 50

## 2019-04-12 MED ORDER — DEXTROSE 50 % IV SOLN
INTRAVENOUS | Status: AC
  Filled 2019-04-12: qty 50

## 2019-04-12 MED ORDER — APIXABAN 5 MG PO TABS
5.0000 mg | ORAL_TABLET | Freq: Two times a day (BID) | ORAL | Status: DC
  Administered 2019-04-13 – 2019-04-14 (×3): 5 mg via ORAL
  Filled 2019-04-12 (×3): qty 1

## 2019-04-12 MED ORDER — DEXTROSE 50 % IV SOLN
INTRAVENOUS | Status: AC
  Administered 2019-04-12: 09:00:00 25 mL
  Filled 2019-04-12: qty 50

## 2019-04-12 MED ORDER — DEXTROSE 5 % IV SOLN: INTRAVENOUS | Status: DC

## 2019-04-12 NOTE — Progress Notes (Signed)
Hypoglycemic Event  @2029  ---CBG: 69  Treatment: D50 50 mL  Follow-up CBG: Time:2053    CBGResult:208  Possible Reasons for Event: Inadequate meal intake  Provider Notified.

## 2019-04-12 NOTE — TOC Progression Note (Signed)
Transition of Care [facility]) - Progression Note    Patient Details  Name: Angela [name] MRN: [id number] Date of Birth: Nov 07, 1918  Transition of Care [facility]) CM/SW Contact  Purcell Mouton, RN Phone Number: 04/12/2019, 2:06 PM  Clinical Narrative:    Spoke with pt's son concerning discharge plans. Son, Daisy selected [address] Auth was started with [facility]. [facility] has offered pt a bed.         Expected Discharge Plan and Services                                                 Social Determinants of Health (SDOH) Interventions    Readmission Risk Interventions No flowsheet data found.

## 2019-04-12 NOTE — Progress Notes (Signed)
Pt cbg was 62 Writer fed pt some food and orange juice CBG recheck was 68 Pt was not willing to eat more at this time Writer gave [facility]   Provider Notified  Will continue to monitor

## 2019-04-12 NOTE — Progress Notes (Signed)
Hypoglycemic Event  CBG: 68  Treatment: D50 25 mL (12.5 gm)  Symptoms: None  Follow-up CBG: Time:0915 CBG Result: 140  Possible Reasons for Event: Inadequate meal intake  Comments/MD notified:     Benjamyn Hestand K

## 2019-04-12 NOTE — Progress Notes (Signed)
PROGRESS NOTE    Angela [name]  [id number] DOB: April 08, 1918 DOA: 03/30/2019 PCP: [name], MD   Brief Narrative:86 year old female with history of breast CA status post mastectomy, anemia, anxiety, rheumatoid arthritis, hypothyroidism, recent hospital admission 3/4-3/10/2019 (secondary to multifocal pneumonia, hyponatremia, AKI, acute diastolic CHF, AVNRT) presented to ED with abdominal pain, fevers, 103.1 F, hypotensive BP 88/60, tachycardia 130, tachypnea with respiratory rate of 40. O2 sats 98% on room air Patient was also found to have wound in the buttocks, sacral region Chest x-ray showed moderate to marked severity left basilar atelectasis and/or infiltrates. Patient was placed on vancomycin, cefepime, metronidazole. COVID-19 positive   Assessment & Plan:   Principal Problem:   Pneumonia due to COVID-19 virus Active Problems:   Sepsis ([place])   Acute respiratory failure with hypoxia (HCC)   Acute diastolic CHF (congestive heart failure) (HCC)   AKI (acute kidney injury) ([place])   Acute respiratory failure (HCC)   Hypothyroidism   Hypernatremia   Pulmonary embolus (HCC)   Thrombocytosis (HCC)   Hypothermia   #1 acute hypoxic respiratory failure secondary to COVID-19 pneumonia/healthcare associated pneumonia/acute PE- She is status post Decadron for 13 days stopped 04/11/2019. Status post remdesivir for 5 days. Status post cefepime for 7 days. On full dose Lovenox for right lower lobe PE with right heart strain.  Will consult pharmacy for [place].  #2 failure to thrive with severe protein calorie malnutrition-with minimal p.o. intake.  Patient refusing p.o. intake and pocketing pills.  #3 acute diastolic heart failure resolved.  Echo with normal ejection fraction.  Not on any diuretics due to soft blood pressure.  Beta-blocker discontinued due to bradycardia.  #4 AKI resolved creatinine 0.53  #5 hypernatremia resolved with IV fluids sodium 138  #6  hypoglycemia due to poor p.o. intake.  Patient is on D5 continuously. CBG (last 3)  Recent Labs    04/12/19 0910 04/12/19 1156 04/12/19 1300  GLUCAP 141* 65* 147*     #7 hypothyroidism Synthroid dose was increased to 25 MCG.  TSH was 12.9.  Repeat TSH in 4 weeks.  #8 hypokalemia resolved  #9 bradycardia resolved off beta-blockers  #10 hypomagnesemia resolved magnesium 2.2  #11 goals of care patient with advanced age and multiple comorbidities now with COVID-19 pneumonia poor nutritional and functional status with poor prognosis. I discussed with her son [name] who is her healthcare power of attorney.  He is upset that hospital does not allow him to come and feed his mother due to Covid restrictions.  He wants her to go to [place] so he can go there and feed her every day till he is able to take her home. He tells me he is not capable of taking her home at this time. I did explain to him that IV fluids is not a long-term option.  We talked about NG tube and PEG tube.  I advised against it. However he is thinking about it and will talk to me tomorrow. Pressure Injury 03/10/19 Hip Right;Lateral Stage 2 -  Partial thickness loss of dermis presenting as a shallow open injury with a red, pink wound bed without slough. (Active)  03/10/19 0800  Location: Hip  Location Orientation: Right;Lateral  Staging: Stage 2 -  Partial thickness loss of dermis presenting as a shallow open injury with a red, pink wound bed without slough.  Wound Description (Comments):   Present on Admission: Yes     Pressure Injury 03/10/19 Sacrum Mid Stage 2 -  Partial thickness loss  of dermis presenting as a shallow open injury with a red, pink wound bed without slough. (Active)  03/10/19 0800  Location: Sacrum  Location Orientation: Mid  Staging: Stage 2 -  Partial thickness loss of dermis presenting as a shallow open injury with a red, pink wound bed without slough.  Wound Description (Comments):   Present  on Admission:      Pressure Injury 03/31/19 Knee Anterior;Left Stage 2 -  Partial thickness loss of dermis presenting as a shallow open injury with a red, pink wound bed without slough. (Active)  03/31/19 1730  Location: Knee  Location Orientation: Anterior;Left  Staging: Stage 2 -  Partial thickness loss of dermis presenting as a shallow open injury with a red, pink wound bed without slough.  Wound Description (Comments):   Present on Admission: Yes      Nutrition Problem: Increased nutrient needs Etiology: wound healing, acute illness, catabolic [id number] infection)     Signs/Symptoms: estimated needs    Interventions: Ensure Enlive (each supplement provides 350kcal and 20 grams of protein), Magic cup, MVI  Estimated body mass index is 15.93 kg/m as calculated from the following:   Height as of this encounter: 5\' 3"  (1.6 m).   Weight as of this encounter: 40.8 kg.  DVT prophylaxis: Lovenox 40 mg daily  code Status DO NOT RESUSCITATE Family Communication:  Disposition Plan: Patient came from home Likely will need SNF on discharge however to be determined Barrier to discharge ongoing hypoglycemia diminished p.o. intake with failure to thrive on D5 IV drip Consultants:   Palliative care  Procedures: None Antimicrobials:vanco/flagyl/cefepime    Subjective:  She is resting in bed when I touch her softly she woke up opened her eyes and was trying to speak Does not appear to be in any distress Objective: Frail elderly female Vitals:   04/11/19 1221 04/11/19 1922 04/12/19 0408 04/12/19 1204  BP: (!) 153/70 (!) 140/56 (!) 141/69 (!) 148/59  Pulse: 61 74 68 65  Resp: 11 18 (!) 22 16  Temp: (!) 97.3 F (36.3 C) 97.9 F (36.6 C) 98.4 F (36.9 C) 98.1 F (36.7 C)  TempSrc: Oral Oral Oral Oral  SpO2: 95% 100% 96% 95%  Weight:      Height:        Intake/Output Summary (Last 24 hours) at 04/12/2019 1248 Last data filed at 04/12/2019 0900 Gross per 24 hour    Intake 1228.78 ml  Output 400 ml  Net 828.78 ml   Filed Weights   03/30/19 1757  Weight: 40.8 kg    Examination:  General exam: Appears calm and comfortable  Respiratory system: Clear to auscultation. Respiratory effort normal. Cardiovascular system: S1 & S2 heard, RRR. No JVD, murmurs, rubs, gallops or clicks. No pedal edema. Gastrointestinal system: Abdomen is nondistended, soft and nontender. No organomegaly or masses felt. Normal bowel sounds heard. Central nervous system: Alert and oriented. No focal neurological deficits. Extremities: Symmetric 5 x 5 power. Skin: No rashes, lesions or ulcers Psychiatry: Judgement and insight appear normal. Mood & affect appropriate.     Data Reviewed: I have personally reviewed following labs and imaging studies  CBC: Recent Labs  Lab 04/08/19 0412 04/09/19 0443 04/10/19 0431 04/10/19 0745 04/11/19 0449  WBC 1.8* 1.8* QUESTIONABLE RESULTS, RECOMMEND RECOLLECT TO VERIFY 1.6* 2.2*  NEUTROABS 1.2* 1.1* QUESTIONABLE RESULTS, RECOMMEND RECOLLECT TO VERIFY 0.9* 1.4*  HGB 9.0* 9.1* QUESTIONABLE RESULTS, RECOMMEND RECOLLECT TO VERIFY 9.1* 9.6*  HCT 30.5* 31.3* QUESTIONABLE RESULTS, RECOMMEND RECOLLECT TO VERIFY 30.3*  33.3*  MCV 90.0 91.8 QUESTIONABLE RESULTS, RECOMMEND RECOLLECT TO VERIFY 91.3 91.0  PLT 1,128* 1,106* QUESTIONABLE RESULTS, RECOMMEND RECOLLECT TO VERIFY 1,148* 0000000*   Basic Metabolic Panel: Recent Labs  Lab 04/08/19 0412 04/08/19 0954 04/09/19 0443 04/10/19 0431 04/10/19 0745 04/11/19 0449  NA 143  --  137 QUESTIONABLE RESULTS, RECOMMEND RECOLLECT TO VERIFY 137 138  K 3.9  --  4.9 QUESTIONABLE RESULTS, RECOMMEND RECOLLECT TO VERIFY 4.4 4.2  CL 112*  --  108 QUESTIONABLE RESULTS, RECOMMEND RECOLLECT TO VERIFY 106 106  CO2 23  --  23 QUESTIONABLE RESULTS, RECOMMEND RECOLLECT TO VERIFY 23 22  GLUCOSE 56*  --  152* QUESTIONABLE RESULTS, RECOMMEND RECOLLECT TO VERIFY 94 108*  BUN 25*  --  19 QUESTIONABLE RESULTS,  RECOMMEND RECOLLECT TO VERIFY 14 12  CREATININE 0.68  --  0.77 QUESTIONABLE RESULTS, RECOMMEND RECOLLECT TO VERIFY 0.69 0.53  CALCIUM 8.0*  --  8.1* QUESTIONABLE RESULTS, RECOMMEND RECOLLECT TO VERIFY 8.1* 8.1*  MG  --  1.6* 2.7* QUESTIONABLE RESULTS, RECOMMEND RECOLLECT TO VERIFY 2.2  --    GFR: Estimated Creatinine Clearance: 24.7 mL/min (by C-G formula based on SCr of 0.53 mg/dL). Liver Function Tests: No results for input(s): AST, ALT, ALKPHOS, BILITOT, PROT, ALBUMIN in the last 168 hours. No results for input(s): LIPASE, AMYLASE in the last 168 hours. No results for input(s): AMMONIA in the last 168 hours. Coagulation Profile: No results for input(s): INR, PROTIME in the last 168 hours. Cardiac Enzymes: No results for input(s): CKTOTAL, CKMB, CKMBINDEX, TROPONINI in the last 168 hours. BNP (last 3 results) No results for input(s): PROBNP in the last 8760 hours. HbA1C: No results for input(s): HGBA1C in the last 72 hours. CBG: Recent Labs  Lab 04/12/19 0109 04/12/19 0405 04/12/19 0816 04/12/19 0910 04/12/19 1156  GLUCAP 246* 72 68* 141* 65*   Lipid Profile: No results for input(s): CHOL, HDL, LDLCALC, TRIG, CHOLHDL, LDLDIRECT in the last 72 hours. Thyroid Function Tests: No results for input(s): TSH, T4TOTAL, FREET4, T3FREE, THYROIDAB in the last 72 hours. Anemia Panel: No results for input(s): VITAMINB12, FOLATE, FERRITIN, TIBC, IRON, RETICCTPCT in the last 72 hours. Sepsis Labs: Recent Labs  Lab 04/07/19 1130 04/08/19 0412 04/09/19 0443  PROCALCITON <0.10 <0.10 <0.10  LATICACIDVEN 1.1  --   --     Recent Results (from the past 240 hour(s))  Culture, blood (Routine X 2) w Reflex to ID Panel     Status: None   Collection Time: 04/03/19  3:52 PM   Specimen: BLOOD LEFT FOREARM  Result Value Ref Range Status   Specimen Description   Final    BLOOD LEFT FOREARM Performed at [facility], [place] [address] [place], [place] [address]    Special  Requests   Final    BOTTLES DRAWN AEROBIC ONLY Blood Culture adequate volume Performed at [facility] [address], [place], [place] [address]    Culture   Final    NO GROWTH 5 DAYS Performed at [facility], [place] [address] [place], [place] [address]    Report Status 04/08/2019 FINAL  Final  Culture, blood (Routine X 2) w Reflex to ID Panel     Status: None   Collection Time: 04/03/19  5:11 PM   Specimen: BLOOD LEFT ARM  Result Value Ref Range Status   Specimen Description   Final    BLOOD LEFT ARM Performed at [facility] [address], [place], [place] [address]  Special Requests   Final    BOTTLES DRAWN AEROBIC ONLY Blood Culture results may not be optimal due to an inadequate volume of blood received in culture bottles Performed at [facility] [address], [place], [place] [address]    Culture   Final    NO GROWTH 5 DAYS Performed at [facility], [place] [address], [place], [place] [address]    Report Status 04/08/2019 FINAL  Final  Culture, Urine     Status: None   Collection Time: 04/07/19 10:41 AM   Specimen: Urine, Clean Catch  Result Value Ref Range Status   Specimen Description   Final    URINE, CLEAN CATCH Performed at [facility], [place] [address], [place], [place] [address]    Special Requests   Final    NONE Performed at [facility], [place] [address] [place], [place] [address]    Culture   Final    NO GROWTH Performed at [facility], [place] [address], [place], [place] [address]    Report Status 04/08/2019 FINAL  Final  Culture, blood (Routine X 2) w Reflex to ID Panel     Status: None   Collection Time: 04/07/19 11:30 AM   Specimen: BLOOD  Result Value Ref Range Status   Specimen Description   Final    BLOOD RIGHT ANTECUBITAL Performed at [facility] [address], [place], [place] [address]    Special  Requests   Final    BOTTLES DRAWN AEROBIC AND ANAEROBIC Blood Culture adequate volume Performed at [facility] [address], [place], [place] [address]    Culture   Final    NO GROWTH 5 DAYS Performed at [facility], [place] [address] [place], [place] [address]    Report Status 04/12/2019 FINAL  Final  Culture, blood (Routine X 2) w Reflex to ID Panel     Status: None   Collection Time: 04/07/19 11:49 AM   Specimen: BLOOD  Result Value Ref Range Status   Specimen Description   Final    BLOOD RIGHT ANTECUBITAL Performed at [facility] [address], [place], [place] [address]    Special Requests   Final    BOTTLES DRAWN AEROBIC ONLY Blood Culture adequate volume Performed at [facility] [address] [place], [place] [address]    Culture   Final    NO GROWTH 5 DAYS Performed at [facility], [place] [address], [place], [place] [address]    Report Status 04/12/2019 FINAL  Final         Radiology Studies: No results found.      Scheduled Meds: . vitamin C  500 mg Oral Daily  . citalopram  10 mg Oral Daily  . enoxaparin (LOVENOX) injection  40 mg Subcutaneous Q24H  . feeding supplement (ENSURE ENLIVE)  237 mL Oral BID BM  . hydrALAZINE  25 mg Oral Daily  . levothyroxine  25 mcg Intravenous Daily  . multivitamin with minerals  1 tablet Oral Daily  . zinc sulfate  220 mg Oral Daily   Continuous Infusions: . dextrose 5 % and 0.9% NaCl 50 mL/hr at 04/11/19 1453     LOS: 13 days     Georgette Shell, MD 04/12/2019, 12:48 PM

## 2019-04-12 NOTE — Progress Notes (Signed)
Hypoglycemic Event  CBG: 65  Treatment: D50 50 mL (25 gm)  Symptoms: None  Follow-up CBG: Time:1300 CBG Result:147  Possible Reasons for Event: Inadequate meal intake  Comments/MD notified:Mathews, see new orders    Ysabel Cowgill K

## 2019-04-12 NOTE — Progress Notes (Signed)
[facility] for LMWH >> Eliquis Indication: pulmonary embolus  Allergies  Allergen Reactions  . Penicillins     REACTION: Question of penicillin allergy Did it involve swelling of the face/tongue/throat, SOB, or low BP? Unknown Did it involve sudden or severe rash/hives, skin peeling, or any reaction on the inside of your mouth or nose? Unknown Did you need to seek medical attention at a hospital or doctor's office? Unknown When did it last happen?Unknown If all above answers are "NO", may proceed with cephalosporin use.    Patient Measurements: Height: 5\' 3"  (160 cm) Weight: (bed not zeroed. Pt is total) IBW/kg (Calculated) : 52.4  Vital Signs: Temp: 98.1 F (36.7 C) (04/08 1204) Temp Source: Oral (04/08 1204) BP: 148/59 (04/08 1204) Pulse Rate: 65 (04/08 1204)  Labs: Recent Labs    04/10/19 0431 04/10/19 0431 04/10/19 0745 04/11/19 0449  HGB QUESTIONABLE RESULTS, RECOMMEND RECOLLECT TO VERIFY   < > 9.1* 9.6*  HCT QUESTIONABLE RESULTS, RECOMMEND RECOLLECT TO VERIFY  --  30.3* 33.3*  PLT QUESTIONABLE RESULTS, RECOMMEND RECOLLECT TO VERIFY  --  1,148* 1,136*  CREATININE QUESTIONABLE RESULTS, RECOMMEND RECOLLECT TO VERIFY  --  0.69 0.53   < > = values in this interval not displayed.   Estimated Creatinine Clearance: 24.7 mL/min (by C-G formula based on SCr of 0.53 mg/dL).  Assessment: 86 yo female with COVID PNA, ddimer > 5, now found to have a PE per CT scan.  Transitioned to Full dose LMWH for PE treatment on 4/1 per pharmacy dosing.  Previously unable/unwilling to take oral medicines, but now attempting transition to Eliquis prior to discharge  Today, 04/12/2019  CBC: Hgb low but stable; Plt remain elevated   SCr stable WNL, currently at baseline (CrCl low d/t age and weight)  No bleeding reported   Plan:   Stop enoxaparin  Begin Eliquis 5 mg PO daily  Based on phase 1 PK data, mean AUC is increased by 20% in  patients < 50 kg, and no dose adjustment is recommended for this group. Can omit high-dose phase (10 mg BID x 7d) however, as patient has been on heparin and LMWH for > 7 days  Reuel Boom, PharmD, BCPS [phone] 04/12/2019, 3:58 PM

## 2019-04-12 NOTE — Progress Notes (Signed)
Hypoglycemic Event  CBG: 68  Treatment: D50 50 mL (25 gm)  Symptoms: None  Follow-up CBG: Time:1826 CBG Result:159  Possible Reasons for Event: Inadequate meal intake  Comments/MD notified:Mathews    Angela [name] K

## 2019-04-13 LAB — GLUCOSE, CAPILLARY
Glucose-Capillary: 100 mg/dL — ABNORMAL HIGH (ref 70–99)
Glucose-Capillary: 165 mg/dL — ABNORMAL HIGH (ref 70–99)
Glucose-Capillary: 47 mg/dL — ABNORMAL LOW (ref 70–99)
Glucose-Capillary: 49 mg/dL — ABNORMAL LOW (ref 70–99)
Glucose-Capillary: 74 mg/dL (ref 70–99)
Glucose-Capillary: 79 mg/dL (ref 70–99)
Glucose-Capillary: 87 mg/dL (ref 70–99)
Glucose-Capillary: 87 mg/dL (ref 70–99)

## 2019-04-13 MED ORDER — DEXTROSE 50 % IV SOLN
INTRAVENOUS | Status: AC
  Administered 2019-04-13: 50 mL
  Filled 2019-04-13: qty 50

## 2019-04-13 MED ORDER — DEXTROSE 10 % IV SOLN: INTRAVENOUS | Status: DC

## 2019-04-13 MED ORDER — LEVOTHYROXINE SODIUM 50 MCG PO TABS
50.0000 ug | ORAL_TABLET | Freq: Every day | ORAL | Status: DC
  Administered 2019-04-14: 07:00:00 50 ug via ORAL
  Filled 2019-04-13: qty 1

## 2019-04-13 MED ORDER — DEXTROSE 50 % IV SOLN
INTRAVENOUS | Status: AC
  Administered 2019-04-13: 17:00:00 50 mL
  Filled 2019-04-13: qty 50

## 2019-04-13 MED ORDER — SALINE SPRAY 0.65 % NA SOLN
1.0000 | NASAL | Status: DC | PRN
  Administered 2019-04-13: 1 via NASAL
  Filled 2019-04-13: qty 44

## 2019-04-13 NOTE — Progress Notes (Signed)
PROGRESS NOTE    Angela [name]  [id number] DOB: 01/21/1918 DOA: 03/30/2019 PCP: [name], MD   Brief Narrative::86 year old female with history of breast CA status post mastectomy, anemia, anxiety, rheumatoid arthritis, hypothyroidism, recent hospital admission 3/4-3/10/2019 (secondary to multifocal pneumonia, hyponatremia, AKI, acute diastolic CHF, AVNRT) presented to ED with abdominal pain, fevers, 103.1 F, hypotensive BP 88/60, tachycardia 130, tachypnea with respiratory rate of 40. O2 sats 98% on room air Patient was also found to have wound in the buttocks, sacral region Chest x-ray showed moderate to marked severity left basilar atelectasis and/or infiltrates. Patient was placed on vancomycin, cefepime, metronidazole. COVID-19 positive   Assessment & Plan:   Principal Problem:   Pneumonia due to COVID-19 virus Active Problems:   Sepsis ([place])   Acute respiratory failure with hypoxia (HCC)   Acute diastolic CHF (congestive heart failure) (HCC)   AKI (acute kidney injury) ([place])   Acute respiratory failure (HCC)   Hypothyroidism   Hypernatremia   Pulmonary embolus (HCC)   Thrombocytosis (HCC)   Hypothermia  #1 acute hypoxic respiratory failure secondary to COVID-19 pneumonia/healthcare associated pneumonia/acute PE- She is status post Decadron for 13 days stopped 04/11/2019. Status post remdesivir for 5 days. Status post cefepime for 7 days. Eliquis started 04/12/2019 for right lower lobe PE with right heart strain.  #2 failure to thrive with severe protein calorie malnutrition-with minimal p.o. intake. Patient refusing p.o. intake and pocketing pills.  #3 acute diastolic heart failure resolved. Echo with normal ejection fraction. Not on any diuretics due to soft blood pressure. Beta-blocker discontinued due to bradycardia.  #4 AKI resolved creatinine 0.53  #5 hypernatremia resolved with IV fluids sodium 138  #6 hypoglycemia due to poor p.o. intake.  Patient is on D5 continuously.  Still hypoglycemic.  I will change IV fluids to D10.  Consulted IR for PEG placement per son's request.  #7 hypothyroidism-continue Synthroid.    #8 hypokalemia resolved  #9 bradycardia resolved off beta-blockers  #10 hypomagnesemia resolved magnesium 2.2  #11 goals of care patient with advanced age and multiple comorbidities now with COVID-19 pneumonia poor nutritional and functional status with poor prognosis. I discussed with her son [name] who is her healthcare power of attorney.  He is upset that hospital does not allow him to come and feed his mother due to Covid restrictions.  He wants her to go to [facility] so he can go there and feed her every day till he is able to take her home. He tells me he is not capable of taking her home at this time. I did explain to him that IV fluids is not a long-term option.  We talked about NG tube and PEG tube.  I advised against it.  #12 pressure injury as below stage II pressure injury to the right hip present on admission. Stage II mid sacral pressure injury And stage II pressure injury to the left knee present on admission.  Pressure Injury 03/10/19 Hip Right;Lateral Stage 2 -  Partial thickness loss of dermis presenting as a shallow open injury with a red, pink wound bed without slough. (Active)  03/10/19 0800  Location: Hip  Location Orientation: Right;Lateral  Staging: Stage 2 -  Partial thickness loss of dermis presenting as a shallow open injury with a red, pink wound bed without slough.  Wound Description (Comments):   Present on Admission: Yes     Pressure Injury 03/10/19 Sacrum Mid Stage 2 -  Partial thickness loss of dermis presenting as a shallow  open injury with a red, pink wound bed without slough. (Active)  03/10/19 0800  Location: Sacrum  Location Orientation: Mid  Staging: Stage 2 -  Partial thickness loss of dermis presenting as a shallow open injury with a red, pink wound bed without  slough.  Wound Description (Comments):   Present on Admission:      Pressure Injury 03/31/19 Knee Anterior;Left Stage 2 -  Partial thickness loss of dermis presenting as a shallow open injury with a red, pink wound bed without slough. (Active)  03/31/19 1730  Location: Knee  Location Orientation: Anterior;Left  Staging: Stage 2 -  Partial thickness loss of dermis presenting as a shallow open injury with a red, pink wound bed without slough.  Wound Description (Comments):   Present on Admission: Yes      Nutrition Problem: Increased nutrient needs Etiology: wound healing, acute illness, catabolic [id number] infection)     Signs/Symptoms: estimated needs    Interventions: Ensure Enlive (each supplement provides 350kcal and 20 grams of protein), Magic cup, MVI  Estimated body mass index is 15.93 kg/m as calculated from the following:   Height as of this encounter: 5\' 3"  (1.6 m).   Weight as of this encounter: 40.8 kg.  DVT prophylaxis:eliquis code StatusDO NOT RESUSCITATE Family Communication: Disposition Plan:Patient came from home PT recommends SNF Await insurance authorization Barriers to discharge ongoing hypoglycemia on IV D5/D10  Consultants:  Palliative care  Procedures:None Antimicrobials:vanco/flagyl/cefepime  Subjective:  Patient is resting in bed Asked me to give her pineapple She remains hypoglycemic in spite of D5 drip She still refuses to eat Objective: Vitals:   04/12/19 1204 04/12/19 2031 04/13/19 0415 04/13/19 1345  BP: (!) 148/59 (!) 154/69 (!) 152/69 (!) 175/73  Pulse: 65 75 69 (!) 58  Resp: 16 18 16 18   Temp: 98.1 F (36.7 C) 98 F (36.7 C) 97.6 F (36.4 C) (!) 97.4 F (36.3 C)  TempSrc: Oral Oral Oral Oral  SpO2: 95% 97% 97% 94%  Weight:      Height:        Intake/Output Summary (Last 24 hours) at 04/13/2019 1504 Last data filed at 04/13/2019 1229 Gross per 24 hour  Intake 1599.5 ml  Output --  Net 1599.5 ml    Filed Weights   03/30/19 1757  Weight: 40.8 kg    Examination:  General exam: Appears calm and comfortable  Respiratory system: Clear to auscultation. Respiratory effort normal. Cardiovascular system: S1 & S2 heard, RRR. No JVD, murmurs, rubs, gallops or clicks. No pedal edema. Gastrointestinal system: Abdomen is nondistended, soft and nontender. No organomegaly or masses felt. Normal bowel sounds heard. Central nervous system: Awake  extremities: Symmetric 5 x 5 power. Skin: No rashes, lesions or ulcers Psychiatry: Unable to assess    Data Reviewed: I have personally reviewed following labs and imaging studies  CBC: Recent Labs  Lab 04/08/19 0412 04/09/19 0443 04/10/19 0431 04/10/19 0745 04/11/19 0449  WBC 1.8* 1.8* QUESTIONABLE RESULTS, RECOMMEND RECOLLECT TO VERIFY 1.6* 2.2*  NEUTROABS 1.2* 1.1* QUESTIONABLE RESULTS, RECOMMEND RECOLLECT TO VERIFY 0.9* 1.4*  HGB 9.0* 9.1* QUESTIONABLE RESULTS, RECOMMEND RECOLLECT TO VERIFY 9.1* 9.6*  HCT 30.5* 31.3* QUESTIONABLE RESULTS, RECOMMEND RECOLLECT TO VERIFY 30.3* 33.3*  MCV 90.0 91.8 QUESTIONABLE RESULTS, RECOMMEND RECOLLECT TO VERIFY 91.3 91.0  PLT 1,128* 1,106* QUESTIONABLE RESULTS, RECOMMEND RECOLLECT TO VERIFY 1,148* 0000000*   Basic Metabolic Panel: Recent Labs  Lab 04/08/19 0412 04/08/19 0954 04/09/19 0443 04/10/19 0431 04/10/19 0745 04/11/19 0449  NA 143  --  137 QUESTIONABLE RESULTS, RECOMMEND RECOLLECT TO VERIFY 137 138  K 3.9  --  4.9 QUESTIONABLE RESULTS, RECOMMEND RECOLLECT TO VERIFY 4.4 4.2  CL 112*  --  108 QUESTIONABLE RESULTS, RECOMMEND RECOLLECT TO VERIFY 106 106  CO2 23  --  23 QUESTIONABLE RESULTS, RECOMMEND RECOLLECT TO VERIFY 23 22  GLUCOSE 56*  --  152* QUESTIONABLE RESULTS, RECOMMEND RECOLLECT TO VERIFY 94 108*  BUN 25*  --  19 QUESTIONABLE RESULTS, RECOMMEND RECOLLECT TO VERIFY 14 12  CREATININE 0.68  --  0.77 QUESTIONABLE RESULTS, RECOMMEND RECOLLECT TO VERIFY 0.69 0.53  CALCIUM 8.0*  --  8.1*  QUESTIONABLE RESULTS, RECOMMEND RECOLLECT TO VERIFY 8.1* 8.1*  MG  --  1.6* 2.7* QUESTIONABLE RESULTS, RECOMMEND RECOLLECT TO VERIFY 2.2  --    GFR: Estimated Creatinine Clearance: 24.7 mL/min (by C-G formula based on SCr of 0.53 mg/dL). Liver Function Tests: No results for input(s): AST, ALT, ALKPHOS, BILITOT, PROT, ALBUMIN in the last 168 hours. No results for input(s): LIPASE, AMYLASE in the last 168 hours. No results for input(s): AMMONIA in the last 168 hours. Coagulation Profile: No results for input(s): INR, PROTIME in the last 168 hours. Cardiac Enzymes: No results for input(s): CKTOTAL, CKMB, CKMBINDEX, TROPONINI in the last 168 hours. BNP (last 3 results) No results for input(s): PROBNP in the last 8760 hours. HbA1C: No results for input(s): HGBA1C in the last 72 hours. CBG: Recent Labs  Lab 04/13/19 0018 04/13/19 0413 04/13/19 0807 04/13/19 0903 04/13/19 1139  GLUCAP 79 87 47* 165* 74   Lipid Profile: No results for input(s): CHOL, HDL, LDLCALC, TRIG, CHOLHDL, LDLDIRECT in the last 72 hours. Thyroid Function Tests: No results for input(s): TSH, T4TOTAL, FREET4, T3FREE, THYROIDAB in the last 72 hours. Anemia Panel: No results for input(s): VITAMINB12, FOLATE, FERRITIN, TIBC, IRON, RETICCTPCT in the last 72 hours. Sepsis Labs: Recent Labs  Lab 04/07/19 1130 04/08/19 0412 04/09/19 0443  PROCALCITON <0.10 <0.10 <0.10  LATICACIDVEN 1.1  --   --     Recent Results (from the past 240 hour(s))  Culture, blood (Routine X 2) w Reflex to ID Panel     Status: None   Collection Time: 04/03/19  3:52 PM   Specimen: BLOOD LEFT FOREARM  Result Value Ref Range Status   Specimen Description   Final    BLOOD LEFT FOREARM Performed at [facility], [place] [address], [place], [place] [address]    Special Requests   Final    BOTTLES DRAWN AEROBIC ONLY Blood Culture adequate volume Performed at [place] [address]  [place], [place] [address]    Culture   Final    NO GROWTH 5 DAYS Performed at [facility], [place] [address] [place], [place] [address]    Report Status 04/08/2019 FINAL  Final  Culture, blood (Routine X 2) w Reflex to ID Panel     Status: None   Collection Time: 04/03/19  5:11 PM   Specimen: BLOOD LEFT ARM  Result Value Ref Range Status   Specimen Description   Final    BLOOD LEFT ARM Performed at [place] [address] [place], [place] [address]    Special Requests   Final    BOTTLES DRAWN AEROBIC ONLY Blood Culture results may not be optimal due to an inadequate volume of blood received in culture bottles Performed at [place] [address], [place], [place] [address]    Culture   Final  NO GROWTH 5 DAYS Performed at [facility], [place] [address], [place], [place] [address]    Report Status 04/08/2019 FINAL  Final  Culture, Urine     Status: None   Collection Time: 04/07/19 10:41 AM   Specimen: Urine, Clean Catch  Result Value Ref Range Status   Specimen Description   Final    URINE, CLEAN CATCH Performed at [facility], [place] [address] [place], [place] [address]    Special Requests   Final    NONE Performed at [facility], [place] [address] [place], [place] [address]    Culture   Final    NO GROWTH Performed at [facility], [place] [address] [place], [place] [address]    Report Status 04/08/2019 FINAL  Final  Culture, blood (Routine X 2) w Reflex to ID Panel     Status: None   Collection Time: 04/07/19 11:30 AM   Specimen: BLOOD  Result Value Ref Range Status   Specimen Description   Final    BLOOD RIGHT ANTECUBITAL Performed at [place] [address], [place], [place] [address]    Special Requests   Final    BOTTLES DRAWN AEROBIC AND ANAEROBIC Blood Culture adequate volume Performed at [place] [address] [place], [place] [address]    Culture   Final    NO GROWTH 5 DAYS Performed at [facility], [place] [address] [place], [place] [address]    Report Status 04/12/2019 FINAL  Final  Culture, blood (Routine X 2) w Reflex to ID Panel     Status: None   Collection Time: 04/07/19 11:49 AM   Specimen: BLOOD  Result Value Ref Range Status   Specimen Description   Final    BLOOD RIGHT ANTECUBITAL Performed at [place] [address] [place], [place] [address]    Special Requests   Final    BOTTLES DRAWN AEROBIC ONLY Blood Culture adequate volume Performed at [place] [address] [place], [place] [address]    Culture   Final    NO GROWTH 5 DAYS Performed at [facility], [place] [address], [place], [place] [address]    Report Status 04/12/2019 FINAL  Final         Radiology Studies: No results found.      Scheduled Meds: . apixaban  5 mg Oral BID  . vitamin C  500 mg Oral Daily  . citalopram  10 mg Oral Daily  . feeding supplement (ENSURE ENLIVE)  237 mL Oral BID BM  . hydrALAZINE  25 mg Oral Daily  . [START ON 04/14/2019] levothyroxine  50 mcg Oral Q0600  . multivitamin with minerals  1 tablet Oral Daily  . zinc sulfate  220 mg Oral Daily   Continuous Infusions: . dextrose 50 mL/hr at 04/12/19 1322     LOS: 14 days     Georgette Shell, MD 04/13/2019, 3:04 PM

## 2019-04-13 NOTE — Progress Notes (Signed)
Nutrition Follow-up  RD working remotely.   DOCUMENTATION CODES:   Underweight  INTERVENTION:  - continue Ensure Enlive BID and Magic Cup BID. - continue to encourage PO intakes and staff to aid with feeding.  - weigh patient today.    NUTRITION DIAGNOSIS:   Increased nutrient needs related to wound healing, acute illness, catabolic [id number] infection) as evidenced by estimated needs. -ongoing  GOAL:   Patient will meet greater than or equal to 90% of their needs -unmet  MONITOR:   PO intake, Supplement acceptance, Labs, Weight trends, Skin  ASSESSMENT:   86 year old female with history of breast cancer s/p mastectomy, anemia, anxiety, rheumatoid arthritis, hypothyroidism, hospitalization 3/4-3/10 for multifocal PNA, hyponatremia, AKI, acute CHF, AVNRT. She presented to ED with abdominal pain, fevers up to103.1 F, hypotensive BP 88/60, tachycardia 130, tachypnea with respiratory rate of 40. CXR showed moderate left basilar atelectasis and/or infiltrates. Patient was placed on vancomycin, cefepime, metronidazole.  Patient is noted to be a/o to self and place. Per flow sheet documentation, she has been consuming mainly 0-10% over the past 1 week. She has been accepting Ensure ~50% of the time offered. She has not been weighed since admission (3/26).  Per notes, patient's son is upset about inability to come in and feed patient. He desires for her to go to [facility] so he can go feed her each day and then take her home. Son is considering pursuing a NGT vs PEG.   Per notes:  - COVID-19 PNA/HCAP, acute PE--s/p IV remdesivir - FTT and severe protein calorie malnutrition--refusing PO items and pocketing pills - acute heart failure--resolved  -AKI--resolved  - hypoglycemia 2/2 poor oral intake--D5 ordered  - ongoing discussions with son about [place] reviewed; CBGs: 79, 87, 47, and 165 mg/dl. Medications reviewed; 500 mg ascorbic acid/day, 25 mcg oral  synthroid/day, daily multivitamin with minerals, 220 mg zinc sulfate/day.  IVF; D5 @ 50 ml/hr (204 kcal).    NUTRITION - FOCUSED PHYSICAL EXAM:  unable to complete at this time.   Diet Order:   Diet Order            DIET SOFT Room service appropriate? Yes; Fluid consistency: Thin  Diet effective now              EDUCATION NEEDS:   No education needs have been identified at this time  Skin:  Skin Assessment: Skin Integrity Issues: Skin Integrity Issues:: Stage II, Other (Comment) Stage II: L knee (3/17); R hip; sacrum Other: wound to L buttocks (3/27)  Last BM:  4/9  Height:   Ht Readings from Last 1 Encounters:  03/30/19 5\' 3"  (1.6 m)    Weight:   Wt Readings from Last 1 Encounters:  03/08/19 40.8 kg    Ideal Body Weight:  52.3 kg  BMI:  Body mass index is 15.93 kg/m.  Estimated Nutritional Needs:   Kcal:  1400-1650 kcal  Protein:  70-85 grams  Fluid:  >/= 1.6 L/day     Angela Matin, MS, RD, LDN, CNSC Inpatient Clinical Dietitian RD pager # available in [facility]  After hours/weekend pager # available in [facility]

## 2019-04-13 NOTE — TOC Progression Note (Addendum)
Transition of Care [facility]) - Progression Note    Patient Details  Name: Angela [name] MRN: [id number] Date of Birth: [birth year]  Transition of [facility]) CM/SW Contact  Ross Ludwig, [place] Phone Number: 04/13/2019, 1:38 PM  Clinical Narrative:     CSW received phone call from [employer], they are requesting updated PT notes.  CSW faxed requested PT notes, in order to have insurance company review, waiting for insurance authorization.  4:30pm  CSW received authorization for patient to go to [facility], authorization number is [id number].  CSW spoke to [facility] and they can accept her tomorrow if she is medically ready for discharge.  Weekend CSW to contact [place] at [place] over the weekend.    Expected Discharge Plan and Services    Snf for short term rehab.                                             Social Determinants of Health (SDOH) Interventions    Readmission Risk Interventions No flowsheet data found.

## 2019-04-13 NOTE — Progress Notes (Signed)
Hypoglycemic Event  CBG: 47  Treatment: D50 50 mL (25 gm)  Symptoms: None  Follow-up CBG: Time:0903 CBG Result:165  Possible Reasons for Event: Inadequate meal intake  Comments/MD notified:[place]    Angela [name]

## 2019-04-13 NOTE — Consult Note (Signed)
   [facility] CM Inpatient Consult   04/13/2019  Alvina MEYAH SALASAR 08/29/1918 [id number]  Patient chart has been reviewed for readmissions less than 30 days and for high risk score, 26%,for unplanned readmissions.  Patient assessed for community [place] [facility] follow up needs.   Chart review reveals current disposition plan is for SNF. No [facility]N CM identifiable needs.  Netta Cedars, MSN, [facility] Liaison Nurse Mobile Phone [phone]  Toll free office [phone]

## 2019-04-13 NOTE — Evaluation (Signed)
Physical Therapy Evaluation Patient Details Name: Angela [name] MRN: [id number] DOB: 12-22-1918 Today's Date: 04/13/2019   History of Present Illness  86 year old female with history of breast CA status post mastectomy, anemia, anxiety, rheumatoid arthritis, hypothyroidism, recent hospital admission 3/4-3/10/2019 (secondary to multifocal pneumonia, hyponatremia, AKI, acute diastolic CHF, AVNRT) presented to ED with abdominal pain, fevers, 103.1 F, hypotensive BP 88/60, tachycardia 130, tachypnea with respiratory rate of 40. Dx of covid 19, pulmonary embolism.  Clinical Impression  Pt admitted with above diagnosis. Pt lying in fetal position in bed. BLEs contracted. She agreed to sitting at edge of bed. Total assist for sidelying to sit. Min to mod assist for sitting balance. Total assist for pivot to recliner. Use of mechanical lift recommended. SNF recommended. Communication limited due to pt being HOH and loud exhaust fan in room. At times she was oriented to place, other times she wasn't.  Pt currently with functional limitations due to the deficits listed below (see PT Problem List). Pt will benefit from skilled PT to increase their independence and safety with mobility to allow discharge to the venue listed below.       Follow Up Recommendations SNF;Supervision/Assistance - 24 hour;Supervision for mobility/OOB    Equipment Recommendations  Other (comment)(TBD at next venue)    Recommendations for Other Services       Precautions / Restrictions Precautions Precautions: Fall;Other (comment) Precaution Comments: sacral decubitus Restrictions Weight Bearing Restrictions: No      Mobility  Bed Mobility   Bed Mobility: Sidelying to Sit   Sidelying to sit: Total assist;HOB elevated       General bed mobility comments: assist to raise trunk, pivot hips with pad and advance BLEs to EOB; pt 10%  Transfers Overall transfer level: Needs assistance Equipment used: 1 person hand  held assist Transfers: Stand Pivot Transfers;Sit to/from Stand Sit to Stand: Total assist;From elevated surface Stand pivot transfers: Total assist       General transfer comment: Total assist to rise and pivot, pt did not WB thru LEs, +2 or lift recommended  Ambulation/Gait                Stairs            Wheelchair Mobility    Modified Rankin (Stroke Patients Only)       Balance Overall balance assessment: Needs assistance Sitting-balance support: Bilateral upper extremity supported;Feet supported Sitting balance-Leahy Scale: Poor Sitting balance - Comments: pt sat at edge of bed x 5 minutes + 2 minutes, min to mod A for balance, VCs for hand placement; attempted to have pt to forward reaching but she is HOH and had difficulty hearing commands due to loud exhaust fan in room Postural control: Posterior lean;Right lateral lean   Standing balance-Leahy Scale: Zero(P+-F and tended to fatigue. She was ambulatory in PLOF)                               Pertinent Vitals/Pain Faces Pain Scale: Hurts even more Pain Location: L knee with movement Pain Descriptors / Indicators: Grimacing;Guarding;Moaning Pain Intervention(s): Limited activity within patient's tolerance;Monitored during session;Patient requesting pain meds-RN notified;Repositioned    Home Living Family/patient expects to be discharged to:: Private residence Living Arrangements: Children Available Help at Discharge: Family             Additional Comments: per CM, to return home with family providing care    Prior Function Level of Independence:  Needs assistance   Gait / Transfers Assistance Needed: Pt was at home using WC and assist for transfer  ADL's / Homemaking Assistance Needed: per chart review Pt was set up for ADL  Comments: Pt groans with deep sternal rub, very hard to arouse - PLOF gleaned through chart review     Hand Dominance        Extremity/Trunk Assessment    Upper Extremity Assessment Upper Extremity Assessment: Defer to OT evaluation    Lower Extremity Assessment Lower Extremity Assessment: RLE deficits/detail;LLE deficits/detail RLE Deficits / Details: B knees contracted,  able to actively extend knee in sitting to ~-35* RLE: Unable to fully assess due to pain LLE Deficits / Details: pain with movement of L knee, can actively extend L knee to ~-40* LLE: Unable to fully assess due to pain    Cervical / Trunk Assessment Cervical / Trunk Assessment: Kyphotic Cervical / Trunk Exceptions: kyphotic in sitting  Communication   Communication: HOH  Cognition Arousal/Alertness: Awake/alert Behavior During Therapy: WFL for tasks assessed/performed Overall Cognitive Status: No family/caregiver present to determine baseline cognitive functioning Area of Impairment: Memory;Orientation                 Orientation Level: Situation;Time;Place             General Comments: at times oriented to location, other times pt was asking where she is      General Comments      Exercises     Assessment/Plan    PT Assessment Patient needs continued PT services  PT Problem List Decreased range of motion;Decreased strength;Decreased mobility;Decreased balance;Decreased activity tolerance;Pain       PT Treatment Interventions Functional mobility training;Balance training;Therapeutic exercise;Therapeutic activities    PT Goals (Current goals can be found in the Care Plan section)  Acute Rehab PT Goals PT Goal Formulation: Patient unable to participate in goal setting Time For Goal Achievement: 04/27/19 Potential to Achieve Goals: Good    Frequency Min 2X/week   Barriers to discharge        Co-evaluation               AM-PAC PT "6 Clicks" Mobility  Outcome Measure Help needed turning from your back to your side while in a flat bed without using bedrails?: A Lot Help needed moving from lying on your back to sitting on the  side of a flat bed without using bedrails?: Total Help needed moving to and from a bed to a chair (including a wheelchair)?: Total Help needed standing up from a chair using your arms (e.g., wheelchair or bedside chair)?: Total Help needed to walk in hospital room?: Total Help needed climbing 3-5 steps with a railing? : Total 6 Click Score: 7    End of Session Equipment Utilized During Treatment: Gait belt Activity Tolerance: Patient limited by pain;Patient limited by fatigue Patient left: in chair;with call bell/phone within reach;with chair alarm set Nurse Communication: Mobility status;Need for lift equipment PT Visit Diagnosis: Muscle weakness (generalized) (M62.81);Difficulty in walking, not elsewhere classified (R26.2)    Time: [id number] PT Time Calculation (min) (ACUTE ONLY): 32 min   Charges:   PT Evaluation $PT Eval Moderate Complexity: 1 Mod PT Treatments $Therapeutic Activity: 8-22 mins       Blondell Reveal Kistler PT 04/13/2019  Acute Rehabilitation Services Pager [phone] Office [phone]

## 2019-04-13 NOTE — Progress Notes (Signed)
Hypoglycemic Event  CBG: 49  Treatment: D50 50 mL (25 gm)  Symptoms: None  Follow-up CBG: Time:1819 CBG Result:100  Possible Reasons for Event: Inadequate meal intake  Comments/MD notified:      Cedrick Partain K

## 2019-04-13 NOTE — Progress Notes (Signed)
Palliative Care Progress Note  Reason for visit: Goals of care in light of COVID 19 and poor intake resulting in hypoglycemia  Discussed with bedside RN as well as Dr. Rodena Piety.  Chart reviewed and I called and was able to reach Ms. Pondexter son, [name].  [name] and I discussed that his mother continues to have periods of waxing and waning mental status with minimal p.o. intake.  He expresses concern that she does not like food at the hospital and also he does not think that enough encouragement is made to let her know that her family wants her to try and eat more.  He relates that when he talks to her on the phone, he feels that she understands and is more willing to try and eat with his encouragement.  He remains frustrated that he is not able to visit and assist her at mealtime.  We discussed continued concern with her hypoglycemia.  [name] reports discussing this earlier with Dr. Rodena Piety and understanding that his mother is not reliably taking enough nutrition to maintain herself at this point in time.  He states that they had brief initial discussion regarding feeding tube and that while he is not excited about the prospect of consideration for this, he has not ruled it out entirely either.  He reports having prior experience with feeding tubes with other family members and friends.  He states that, "at mom's age, most of the people did not do very well."  We discussed the potential benefits and burdens of feeding tube in somebody with advanced age and questionable cognitive ability to understand reason for tube particularly when primary reason for placement is not inability to swallow but cognitive impairment leading to not taking in enough nutrition and hydration.  Discussed increased risk of ulcers, infection, needing restraints, and bedsores.  [name] then states that he is continuing to pray and hope that his mother's intake will improve.  He is focused on trying to do anything he can to encourage  her to eat and specifically requested a note be placed to remind her that, "[name] wants you to try and eat."  I placed this on her door per his request.  He again explained that she is a very picky eater and is only interested in certain things at certain times of the day.  For example, he has been very clear throughout her admission that she only eats cornflakes with 2 tablespoons of warm water on them when she is at home for breakfast each morning.  He would also like staff to continue to try to make selections of things such as cornflakes, chicken, greens, and cabbage as these are her favorite meals.  He has been trying to encourage her that she needs to eat food here at the hospital even if it is not her favorite.  -Patient's family remains focused on continuation of current interventions and remain hopeful that she will be able to to be transition to skilled facility for attempt at rehab. -Her son expressed understanding concerns with consideration for placement of feeding tube, but he states that this is not something family has ruled out altogether. -We will continue to follow and attempt to progress conversation based upon her clinical course and family acceptance and understanding of her situation.  Total time: 30 minutes  Greater than 50%  of this time was spent counseling and coordinating care related to the above assessment and plan.  Micheline Rough, MD [facility] [phone]

## 2019-04-13 NOTE — Progress Notes (Signed)
Pharmacy: IV to PO synthroid  Synthroid 25 mcg IV changed to synthroid 50 mcg po qday  Eudelia Bunch, Pharm.D [phone] 04/13/2019 1:04 PM

## 2019-04-14 DIAGNOSIS — R64 Cachexia: Secondary | ICD-10-CM | POA: Diagnosis not present

## 2019-04-14 DIAGNOSIS — Z743 Need for continuous supervision: Secondary | ICD-10-CM | POA: Diagnosis not present

## 2019-04-14 DIAGNOSIS — I110 Hypertensive heart disease with heart failure: Secondary | ICD-10-CM | POA: Diagnosis not present

## 2019-04-14 DIAGNOSIS — B3749 Other urogenital candidiasis: Secondary | ICD-10-CM | POA: Diagnosis not present

## 2019-04-14 DIAGNOSIS — L89893 Pressure ulcer of other site, stage 3: Secondary | ICD-10-CM | POA: Diagnosis not present

## 2019-04-14 DIAGNOSIS — R269 Unspecified abnormalities of gait and mobility: Secondary | ICD-10-CM | POA: Diagnosis not present

## 2019-04-14 DIAGNOSIS — U071 COVID-19: Secondary | ICD-10-CM | POA: Diagnosis not present

## 2019-04-14 DIAGNOSIS — D473 Essential (hemorrhagic) thrombocythemia: Secondary | ICD-10-CM | POA: Diagnosis not present

## 2019-04-14 DIAGNOSIS — R627 Adult failure to thrive: Secondary | ICD-10-CM | POA: Diagnosis not present

## 2019-04-14 DIAGNOSIS — M1990 Unspecified osteoarthritis, unspecified site: Secondary | ICD-10-CM | POA: Diagnosis not present

## 2019-04-14 DIAGNOSIS — R1084 Generalized abdominal pain: Secondary | ICD-10-CM | POA: Diagnosis not present

## 2019-04-14 DIAGNOSIS — M24561 Contracture, right knee: Secondary | ICD-10-CM | POA: Diagnosis not present

## 2019-04-14 DIAGNOSIS — J1282 Pneumonia due to coronavirus disease 2019: Secondary | ICD-10-CM | POA: Diagnosis not present

## 2019-04-14 DIAGNOSIS — R600 Localized edema: Secondary | ICD-10-CM | POA: Diagnosis not present

## 2019-04-14 DIAGNOSIS — I2721 Secondary pulmonary arterial hypertension: Secondary | ICD-10-CM | POA: Diagnosis not present

## 2019-04-14 DIAGNOSIS — M069 Rheumatoid arthritis, unspecified: Secondary | ICD-10-CM | POA: Diagnosis not present

## 2019-04-14 DIAGNOSIS — I2699 Other pulmonary embolism without acute cor pulmonale: Secondary | ICD-10-CM | POA: Diagnosis not present

## 2019-04-14 DIAGNOSIS — R601 Generalized edema: Secondary | ICD-10-CM | POA: Diagnosis not present

## 2019-04-14 DIAGNOSIS — R300 Dysuria: Secondary | ICD-10-CM | POA: Diagnosis not present

## 2019-04-14 DIAGNOSIS — D649 Anemia, unspecified: Secondary | ICD-10-CM | POA: Diagnosis not present

## 2019-04-14 DIAGNOSIS — L89303 Pressure ulcer of unspecified buttock, stage 3: Secondary | ICD-10-CM | POA: Diagnosis not present

## 2019-04-14 DIAGNOSIS — M24562 Contracture, left knee: Secondary | ICD-10-CM | POA: Diagnosis not present

## 2019-04-14 DIAGNOSIS — E878 Other disorders of electrolyte and fluid balance, not elsewhere classified: Secondary | ICD-10-CM | POA: Diagnosis not present

## 2019-04-14 DIAGNOSIS — D72819 Decreased white blood cell count, unspecified: Secondary | ICD-10-CM | POA: Diagnosis not present

## 2019-04-14 DIAGNOSIS — L89610 Pressure ulcer of right heel, unstageable: Secondary | ICD-10-CM | POA: Diagnosis not present

## 2019-04-14 DIAGNOSIS — M0549 Rheumatoid myopathy with rheumatoid arthritis of multiple sites: Secondary | ICD-10-CM | POA: Diagnosis not present

## 2019-04-14 DIAGNOSIS — L89154 Pressure ulcer of sacral region, stage 4: Secondary | ICD-10-CM | POA: Diagnosis not present

## 2019-04-14 DIAGNOSIS — D72818 Other decreased white blood cell count: Secondary | ICD-10-CM | POA: Diagnosis not present

## 2019-04-14 DIAGNOSIS — R109 Unspecified abdominal pain: Secondary | ICD-10-CM | POA: Diagnosis not present

## 2019-04-14 DIAGNOSIS — Z7409 Other reduced mobility: Secondary | ICD-10-CM | POA: Diagnosis not present

## 2019-04-14 DIAGNOSIS — L89214 Pressure ulcer of right hip, stage 4: Secondary | ICD-10-CM | POA: Diagnosis not present

## 2019-04-14 DIAGNOSIS — E162 Hypoglycemia, unspecified: Secondary | ICD-10-CM | POA: Diagnosis not present

## 2019-04-14 DIAGNOSIS — L8990 Pressure ulcer of unspecified site, unspecified stage: Secondary | ICD-10-CM | POA: Diagnosis not present

## 2019-04-14 DIAGNOSIS — M2550 Pain in unspecified joint: Secondary | ICD-10-CM | POA: Diagnosis not present

## 2019-04-14 DIAGNOSIS — D6489 Other specified anemias: Secondary | ICD-10-CM | POA: Diagnosis not present

## 2019-04-14 DIAGNOSIS — R1312 Dysphagia, oropharyngeal phase: Secondary | ICD-10-CM | POA: Diagnosis not present

## 2019-04-14 DIAGNOSIS — A419 Sepsis, unspecified organism: Secondary | ICD-10-CM | POA: Diagnosis not present

## 2019-04-14 DIAGNOSIS — E440 Moderate protein-calorie malnutrition: Secondary | ICD-10-CM | POA: Diagnosis not present

## 2019-04-14 DIAGNOSIS — Z7401 Bed confinement status: Secondary | ICD-10-CM | POA: Diagnosis not present

## 2019-04-14 DIAGNOSIS — M6281 Muscle weakness (generalized): Secondary | ICD-10-CM | POA: Diagnosis not present

## 2019-04-14 DIAGNOSIS — I10 Essential (primary) hypertension: Secondary | ICD-10-CM | POA: Diagnosis not present

## 2019-04-14 DIAGNOSIS — I5031 Acute diastolic (congestive) heart failure: Secondary | ICD-10-CM | POA: Diagnosis not present

## 2019-04-14 DIAGNOSIS — E039 Hypothyroidism, unspecified: Secondary | ICD-10-CM | POA: Diagnosis not present

## 2019-04-14 DIAGNOSIS — I5033 Acute on chronic diastolic (congestive) heart failure: Secondary | ICD-10-CM | POA: Diagnosis not present

## 2019-04-14 DIAGNOSIS — J9601 Acute respiratory failure with hypoxia: Secondary | ICD-10-CM | POA: Diagnosis not present

## 2019-04-14 DIAGNOSIS — E875 Hyperkalemia: Secondary | ICD-10-CM | POA: Diagnosis not present

## 2019-04-14 DIAGNOSIS — K5909 Other constipation: Secondary | ICD-10-CM | POA: Diagnosis not present

## 2019-04-14 DIAGNOSIS — N390 Urinary tract infection, site not specified: Secondary | ICD-10-CM | POA: Diagnosis not present

## 2019-04-14 LAB — GLUCOSE, CAPILLARY
Glucose-Capillary: 66 mg/dL — ABNORMAL LOW (ref 70–99)
Glucose-Capillary: 65 mg/dL — ABNORMAL LOW (ref 70–99)
Glucose-Capillary: 92 mg/dL (ref 70–99)
Glucose-Capillary: 69 mg/dL — ABNORMAL LOW (ref 70–99)
Glucose-Capillary: 46 mg/dL — ABNORMAL LOW (ref 70–99)
Glucose-Capillary: 62 mg/dL — ABNORMAL LOW (ref 70–99)
Glucose-Capillary: 73 mg/dL (ref 70–99)

## 2019-04-14 MED ORDER — ZINC SULFATE 220 (50 ZN) MG PO CAPS: 220.0000 mg | ORAL_CAPSULE | Freq: Every day | ORAL | Status: AC

## 2019-04-14 MED ORDER — DEXTROSE 5 % IV SOLN: 75.0000 mL | INTRAVENOUS | 2 refills | Status: AC

## 2019-04-14 MED ORDER — ASCORBIC ACID 500 MG PO TABS: 500.0000 mg | ORAL_TABLET | Freq: Every day | ORAL | Status: AC

## 2019-04-14 MED ORDER — SALINE SPRAY 0.65 % NA SOLN: 1.0000 | NASAL | 0 refills | Status: AC | PRN

## 2019-04-14 MED ORDER — LEVOTHYROXINE SODIUM 50 MCG PO TABS: 50.0000 ug | ORAL_TABLET | Freq: Every day | ORAL | 0 refills | Status: AC

## 2019-04-14 MED ORDER — GLUCOSE 40 % PO GEL
ORAL | Status: AC
  Filled 2019-04-14: qty 1

## 2019-04-14 MED ORDER — ADULT MULTIVITAMIN W/MINERALS CH: 1.0000 | ORAL_TABLET | Freq: Every day | ORAL | Status: AC

## 2019-04-14 MED ORDER — DEXTROSE 50 % IV SOLN
INTRAVENOUS | Status: AC
  Administered 2019-04-14: 25 mL
  Filled 2019-04-14: qty 50

## 2019-04-14 MED ORDER — DEXTROSE 5 % IV SOLN: INTRAVENOUS | Status: DC

## 2019-04-14 MED ORDER — APIXABAN 5 MG PO TABS: 5.0000 mg | ORAL_TABLET | Freq: Two times a day (BID) | ORAL | 1 refills | Status: AC

## 2019-04-14 MED ORDER — DEXTROSE 10 % IV SOLN: 50.0000 mL | INTRAVENOUS | 1 refills | Status: AC

## 2019-04-14 NOTE — TOC Transition Note (Signed)
Transition of Care [facility]) - CM/SW Discharge Note   Patient Details  Name: Angela [name] MRN: [id number] Date of Birth: 12-06-1918  Transition of Care [facility]) CM/SW Contact:  Lennart Pall, LCSW Phone Number: 04/14/2019, 12:23 PM   Clinical Narrative:  Pt medically cleared for d/c today.  Son, MD and RN alerted and PTAR called.  No further needs.     Final next level of care: Skilled Nursing Facility Barriers to Discharge: Barriers Resolved   Patient Goals and CMS Choice        Discharge Placement              Patient chooses bed at: [facility] Patient to be transferred to facility by: [facility] Name of family member notified: son, [name] Patient and family notified of of transfer: 04/14/19  Discharge Plan and Services                                     Social Determinants of Health (SDOH) Interventions     Readmission Risk Interventions No flowsheet data found.

## 2019-04-14 NOTE — Plan of Care (Signed)
  Problem: Clinical Measurements: °Goal: Respiratory complications will improve °Outcome: Adequate for Discharge °  °Problem: Clinical Measurements: °Goal: Ability to maintain clinical measurements within normal limits will improve °Outcome: Adequate for Discharge °  °

## 2019-04-14 NOTE — Progress Notes (Signed)
Patient discharged to skilled facility. Confirmed with Team that the [facility] procedure for gastrostomy tube placement is no longer. Order will be cancelled at this time.

## 2019-04-14 NOTE — Progress Notes (Signed)
Called and gave report to a nurse in [facility]. Pt will be discahrge with peripheral IV and IVF infusing and will continue the IVF at SNF.

## 2019-04-14 NOTE — Discharge Summary (Addendum)
Physician Discharge Summary  Angela [name] [id number] DOB: 03-30-1918 DOA: 03/30/2019  PCP: [name], MD  Admit date: 03/30/2019 Discharge date: 04/14/2019  Admitted From: home Disposition:  home Recommendations for Outpatient Follow-up:  1. Follow up with PCP in 1-2 weeks 2. Please obtain BMP/CBC in one week  Home Health:none Equipment/Devices:none  Discharge Condition:stable CODE STATUS:dnr Diet recommendation:regular  Brief/Interim Summary:86 year old female with history of breast CA status post mastectomy, anemia, anxiety, rheumatoid arthritis, hypothyroidism, recent hospital admission 3/4-3/10/2019 (secondary to multifocal pneumonia, hyponatremia, AKI, acute diastolic CHF, AVNRT) presented to ED with abdominal pain, fevers, 103.1 F, hypotensive BP 88/60, tachycardia 130, tachypnea with respiratory rate of 40. O2 sats 98% on room air Patient was also found to have wound in the buttocks, sacral region Chest x-ray showed moderate to marked severity left basilar atelectasis and/or infiltrates. Patient was placed on vancomycin, cefepime, metronidazole. COVID-19 positive  Discharge Diagnoses:  Principal Problem:   Pneumonia due to COVID-19 virus Active Problems:   Sepsis ([place])   Acute respiratory failure with hypoxia (HCC)   Acute diastolic CHF (congestive heart failure) (HCC)   AKI (acute kidney injury) ([place])   Acute respiratory failure (HCC)   Hypothyroidism   Hypernatremia   Pulmonary embolus (HCC)   Thrombocytosis (H[facility])   Hypothermia  #1 acute hypoxic respiratory failure secondary to COVID-19 pneumonia/healthcare associated pneumonia/acute PE- She is status post Decadron for 13 days stopped 04/11/2019. Status post remdesivir for 5 days. Status post cefepime for 7 days. Eliquis started 04/12/2019 for right lower lobe PE with right heart strain.  #2 failure to thrive with severe protein calorie malnutrition-with minimal p.o. intake. Patient refusing p.o. intake  and pocketing pills.  #3 acute diastolic heart failure resolved. Echo with normal ejection fraction.  #4 AKI resolved creatinine 0.53  #5 hypernatremia resolved with IV fluids sodium 138  #6 hypoglycemia due to poor p.o. intake. Patient is on D5continuously.  Please continue D5 at 75 cc hour  for 2 days until she is able to start eating again.  The hope is that her son can come in and feed her 3 times a day.  Son checked with [facility] and [place] here checked with [facility] and was told that he can come in and feed her.  So please continue the D5  for 2 days or until she is no more hypoglycemic..  #7 hypothyroidism-continue Synthroid.    #8 hypokalemia resolved  #9 bradycardia resolved off beta-blockers  #10 hypomagnesemia resolved magnesium 2.2  #11 goals of care patient with advanced age and multiple comorbidities now with COVID-19 pneumonia poor nutritional and functional status with poor prognosis  #12 pressure injury as below stage II pressure injury to the right hip present on admission. Stage II mid sacral pressure injury And stage II pressure injury to the left knee present on admission.  Pressure Injury 03/10/19 Hip Right;Lateral Stage 2 -  Partial thickness loss of dermis presenting as a shallow open injury with a red, pink wound bed without slough. (Active)  03/10/19 0800  Location: Hip  Location Orientation: Right;Lateral  Staging: Stage 2 -  Partial thickness loss of dermis presenting as a shallow open injury with a red, pink wound bed without slough.  Wound Description (Comments):   Present on Admission: Yes     Pressure Injury 03/10/19 Sacrum Mid Stage 2 -  Partial thickness loss of dermis presenting as a shallow open injury with a red, pink wound bed without slough. (Active)  03/10/19 0800  Location: Sacrum  Location  Orientation: Mid  Staging: Stage 2 -  Partial thickness loss of dermis presenting as a shallow open injury with a red, pink wound bed without  slough.  Wound Description (Comments):   Present on Admission:      Pressure Injury 03/31/19 Knee Anterior;Left Stage 2 -  Partial thickness loss of dermis presenting as a shallow open injury with a red, pink wound bed without slough. (Active)  03/31/19 1730  Location: Knee  Location Orientation: Anterior;Left  Staging: Stage 2 -  Partial thickness loss of dermis presenting as a shallow open injury with a red, pink wound bed without slough.  Wound Description (Comments):   Present on Admission: Yes      Nutrition Problem: Increased nutrient needs Etiology: wound healing, acute illness, catabolic [id number] infection)    Signs/Symptoms: estimated needs     Interventions: Ensure Enlive (each supplement provides 350kcal and 20 grams of protein), Magic cup, MVI  Estimated body mass index is 15.93 kg/m as calculated from the following:   Height as of this encounter: 5\' 3"  (1.6 m).   Weight as of this encounter: 40.8 kg.  Discharge Instructions  Discharge Instructions    Diet - low sodium heart healthy   Complete by: As directed    Increase activity slowly   Complete by: As directed      Allergies as of 04/14/2019      Reactions   Penicillins    REACTION: Question of penicillin allergy Did it involve swelling of the face/tongue/throat, SOB, or low BP? Unknown Did it involve sudden or severe rash/hives, skin peeling, or any reaction on the inside of your mouth or nose? Unknown Did you need to seek medical attention at a hospital or doctor's office? Unknown When did it last happen?Unknown If all above answers are "NO", may proceed with cephalosporin use.      Medication List    TAKE these medications   acetaminophen 325 MG tablet Commonly known as: TYLENOL Take 2 tablets (650 mg total) by mouth every 6 (six) hours as needed for mild pain, moderate pain, fever or headache (fever >/= 101).   apixaban 5 MG Tabs tablet Commonly known as: ELIQUIS Take 1  tablet (5 mg total) by mouth 2 (two) times daily.   ascorbic acid 500 MG tablet Commonly known as: VITAMIN C Take 1 tablet (500 mg total) by mouth daily. Start taking on: April 15, 2019   citalopram 10 MG tablet Commonly known as: CeleXA Take 1 tablet (10 mg total) by mouth daily.   dextrose 10 % infusion Inject 50 mLs into the vein continuous for 2 days.   fluticasone 50 MCG/ACT nasal spray Commonly known as: FLONASE Place 2 sprays into both nostrils daily as needed for allergies. SHAKE LIQUID AND USE 2 SPRAYS IN EACH NOSTRIL DAILY   levothyroxine 50 MCG tablet Commonly known as: SYNTHROID Take 1 tablet (50 mcg total) by mouth daily at 6 (six) AM. Start taking on: April 15, 2019 What changed:   medication strength  how much to take  when to take this   metoprolol tartrate 25 MG tablet Commonly known as: LOPRESSOR Take 1 tablet (25 mg total) by mouth 2 (two) times daily.   multivitamin with minerals Tabs tablet Take 1 tablet by mouth daily. Start taking on: April 15, 2019   sodium chloride 0.65 % Soln nasal spray Commonly known as: OCEAN Place 1 spray into both nostrils as needed for congestion.   Vitamin D (Ergocalciferol) 1.25 MG (50000 UNIT) Caps  capsule Commonly known as: DRISDOL Take 1 capsule (50,000 Units total) by mouth every 7 (seven) days.   zinc sulfate 220 (50 Zn) MG capsule Take 1 capsule (220 mg total) by mouth daily. Start taking on: April 15, 2019      Follow-up Information    [name], MD Follow up.   Specialties: Internal Medicine, Radiology Contact information: [place] [address] [phone]          Allergies  Allergen Reactions  . Penicillins     REACTION: Question of penicillin allergy Did it involve swelling of the face/tongue/throat, SOB, or low BP? Unknown Did it involve sudden or severe rash/hives, skin peeling, or any reaction on the inside of your mouth or nose? Unknown Did you need to seek  medical attention at a hospital or doctor's office? Unknown When did it last happen?Unknown If all above answers are "NO", may proceed with cephalosporin use.     Consultations: None  Procedures/Studies: CT ABDOMEN PELVIS WO CONTRAST  Result Date: 03/30/2019 CLINICAL DATA:  Sepsis abdominal pain and distension EXAM: CT ABDOMEN AND PELVIS WITHOUT CONTRAST TECHNIQUE: Multidetector CT imaging of the abdomen and pelvis was performed following the standard protocol without IV contrast. COMPARISON:  None. FINDINGS: Lower chest: There is mild cardiomegaly. A small left pleural effusion is seen. Patchy airspace opacity seen at the right lung base. The visualized portions of the lungs are clear. Hepatobiliary: Although limited due to the lack of intravenous contrast, normal in appearance without gross focal abnormality. Layering calcified gallstones are present. Pancreas:  Unremarkable.  No surrounding inflammatory changes. Spleen: Normal in size. Although limited due to the lack of intravenous contrast, normal in appearance. Adrenals/Urinary Tract: Limited visualization of the adrenal glands. The kidneys and collecting system appear normal without evidence of urinary tract calculus or hydronephrosis. Tiny calcifications seen adjacent to the right renal hilum which could be renal vascular. Bladder is unremarkable. Stomach/Bowel: Although limited the stomach, small bowel and colon are grossly unremarkable. Scattered colonic diverticula are noted. Vascular/Lymphatic: Scattered aortic atherosclerotic calcifications are seen without aneurysmal dilatation. Reproductive: Calcified uterine fibroid is seen. Other: There appears to be diffuse anasarca. Musculoskeletal: Diffuse osteopenia is seen. Bilateral hip osteoarthritis is seen with superior joint space loss and marginal osteophyte formation. IMPRESSION: Small left pleural effusion with patchy airspace opacity which could be due to atelectasis and/or early  infectious etiology. Limited examination due to technique and patient's body habitus, however no definite acute intra-abdominal or pelvic pathology. Cholelithiasis Diffuse anasarca Aortic Atherosclerosis (ICD10-I70.0). Electronically Signed   By: Prudencio Pair M.D.   On: 03/30/2019 21:33   CT ANGIO CHEST PE W OR WO CONTRAST  Result Date: 03/31/2019 CLINICAL DATA:  Elevated D-dimer. EXAM: CT ANGIOGRAPHY CHEST WITH CONTRAST TECHNIQUE: Multidetector CT imaging of the chest was performed using the standard protocol during bolus administration of intravenous contrast. Multiplanar CT image reconstructions and MIPs were obtained to evaluate the vascular anatomy. CONTRAST:  74mL OMNIPAQUE IOHEXOL 350 MG/ML SOLN COMPARISON:  None. FINDINGS: Cardiovascular: Satisfactory opacification of the pulmonary arteries to the segmental level. Small pulmonary embolus in the right lower lobe segmental pulmonary artery cardiomegaly. Thoracic aortic atherosclerosis. No pericardial effusion. Mediastinum/Nodes: No enlarged mediastinal, hilar, or axillary lymph nodes. Thyroid gland, trachea, and esophagus demonstrate no significant findings. Lungs/Pleura: Small bilateral pleural effusions. Left basilar atelectasis. Mild interstitial prominence. No focal consolidation. No pneumothorax. Upper Abdomen: No acute abnormality. Musculoskeletal: No acute osseous abnormality. No aggressive osseous lesion. Severe dextroscoliosis of the thoracolumbar spine.  No aggressive osseous lesion. Severe osteoarthritis of the right glenohumeral joint. Severe osteoarthritis of the sternoclavicular joints. Other: Diffuse anasarca. Review of the MIP images confirms the above findings. IMPRESSION: 1. Small pulmonary embolus in the right lower lobe segmental pulmonary artery cardiomegaly. RV/LV ratio 1.3 which can be seen with right heart strain, but there is likely underlying right heart failure in this patient with low PE burden. 2. Findings concerning for mild  CHF.  Diffuse anasarca. 3. Aortic Atherosclerosis (ICD10-I70.0). Critical Value/emergent results were called by telephone at the time of interpretation on 03/31/2019 at 4:29 am to provider [facility] , who verbally acknowledged these results. Electronically Signed   By: Kathreen Devoid   On: 03/31/2019 04:46   DG CHEST PORT 1 VIEW  Result Date: 04/07/2019 CLINICAL DATA:  Hypothermia, elevated BP. Covid positive. Former smoker. *Best obtainable image, unable to sit patient upright due to pain from sacral ulcer. EXAM: PORTABLE CHEST 1 VIEW COMPARISON:  04/03/2019 and older studies. FINDINGS: Study is rotated. A density projects over the right mid to lower lung, which may be external to this patient, having defined, geographic margins. Lungs show interstitial thickening and hazy airspace type opacities, latter more evident on the right, similar to the prior exam allowing for differences in patient positioning and technique. No convincing pleural effusion and no pneumothorax. IMPRESSION: 1. No convincing change from the most recent prior study. Irregular interstitial thickening, and more subtle hazy airspace type opacities. Subtle airspace opacities consistent with atypical/viral infection. Electronically Signed   By: Lajean Manes M.D.   On: 04/07/2019 11:12   DG Chest Port 1 View  Result Date: 04/03/2019 CLINICAL DATA:  Pneumonia.  COVID-19 positive. EXAM: PORTABLE CHEST 1 VIEW COMPARISON:  March 30, 2019 FINDINGS: The heart size is significantly enlarged. Aortic calcifications are noted. There is no pneumothorax. There are hazy bilateral airspace opacities. There is some scarring versus atelectasis at the lung bases and lung apices. Advanced degenerative changes are noted of the right glenohumeral joint. IMPRESSION: 1. Cardiomegaly. 2. Subtle hazy bilateral airspace opacities are nonspecific and may be secondary to pulmonary edema or an atypical infectious process such as viral pneumonia. Electronically  Signed   By: Constance Holster M.D.   On: 04/03/2019 16:37   DG Chest Port 1 View  Result Date: 03/30/2019 CLINICAL DATA:  Abdominal pain. EXAM: PORTABLE CHEST 1 VIEW COMPARISON:  March 14, 2019 FINDINGS: Moderate to marked severity left basilar atelectasis and/or infiltrate is seen. This is stable in severity when compared to the prior study. A 6 mm calcified nodular opacity is seen overlying the mid right lung. A small left pleural effusion is noted which is mildly increased in size. No pneumothorax is identified. The cardiac silhouette is mildly enlarged and unchanged in size. Radiopaque surgical clips are seen along the lateral aspect of the mid left hemithorax. Degenerative changes seen throughout the thoracic spine with stable moderate to marked severity dextroscoliosis. IMPRESSION: 1. Moderate to marked severity left basilar atelectasis and/or infiltrate. This is stable in severity when compared to the prior study. 2. Small left pleural effusion which is mildly increased in size. Electronically Signed   By: Virgina Norfolk M.D.   On: 03/30/2019 18:40   ECHOCARDIOGRAM LIMITED  Result Date: 04/01/2019    ECHOCARDIOGRAM LIMITED REPORT   Patient Name:   Angela [name] Date of Exam: 04/01/2019 Medical Rec #:  [id number]        Height:       63.0 in Accession #:  [id number]       Weight:       89.9 lb Date of Birth:  [birth year]        BSA:          1.377 m Patient Age:    43 years         BP:           174/74 mmHg Patient Gender: F                HR:           70 bpm. Exam Location:  Inpatient Procedure: Limited Echo Indications:    415.19 pulmonary embolus  History:        Patient has prior history of Echocardiogram examinations, most                 recent 03/09/2019. Risk Factors:Former Smoker.  Sonographer:    Jannett Celestine RDCS (AE) Referring Phys: [id number] RIPUDEEP K RAI  Sonographer Comments: No parasternal window and suboptimal apical window. Image acquisition challenging due to mastectomy.  restricted mobility (patient unable to straighten knees) IMPRESSIONS  1. Left ventricular ejection fraction, by estimation, is 55 to 60%. The left ventricle has normal function. There is mild concentric left ventricular hypertrophy.  2. Right ventricular systolic function is mildly reduced. The right ventricular size is moderately enlarged. Mildly increased right ventricular wall thickness. There is moderately elevated pulmonary artery systolic pressure. The estimated right ventricular systolic pressure is Q000111Q mmHg.  3. Left atrial size was severely dilated.  4. Right atrial size was severely dilated.  5. The mitral valve is degenerative. Mild to moderate mitral valve regurgitation.  6. Tricuspid valve regurgitation is moderate to severe.  7. Aortic valve is heavily calcified with restricted movement. Doppler interrogation is not optimal. Suspect mild to moderate aortic stenosis is present. The aortic valve is tricuspid. Aortic valve regurgitation is not visualized.  8. The inferior vena cava is normal in size with <50% respiratory variability, suggesting right atrial pressure of 8 mmHg. Comparison(s): A prior study was performed on 03/09/2019. Changes from prior study are noted. RV appears moderately dilated on current study. LVEF remains unchanged. FINDINGS  Left Ventricle: Left ventricular ejection fraction, by estimation, is 55 to 60%. The left ventricle has normal function. The left ventricular internal cavity size was normal in size. There is mild concentric left ventricular hypertrophy. Right Ventricle: The right ventricular size is moderately enlarged. Mildly increased right ventricular wall thickness. Right ventricular systolic function is mildly reduced. There is moderately elevated pulmonary artery systolic pressure. The tricuspid regurgitant velocity is 3.19 m/s, and with an assumed right atrial pressure of 8 mmHg, the estimated right ventricular systolic pressure is Q000111Q mmHg. Left Atrium: Left atrial  size was severely dilated. Right Atrium: Right atrial size was severely dilated. Pericardium: A small pericardial effusion is present. The pericardial effusion is circumferential. Presence of pericardial fat pad. Mitral Valve: The mitral valve is degenerative in appearance. Mild to moderate mitral valve regurgitation. Tricuspid Valve: Tricuspid valve regurgitation is moderate to severe. Aortic Valve: Aortic valve is heavily calcified with restricted movement. Doppler interrogation is not optimal. Suspect mild to moderate aortic stenosis is present. The aortic valve is tricuspid. Aortic valve regurgitation is not visualized. Aortic valve  mean gradient measures 7.0 mmHg. Aortic valve peak gradient measures 14.3 mmHg. Venous: The inferior vena cava is normal in size with less than 50% respiratory variability, suggesting right atrial pressure of 8 mmHg. Additional Comments: There is a small  pleural effusion in the left lateral region.  AORTIC VALVE AV Vmax:           189.00 cm/s AV Vmean:          119.000 cm/s AV VTI:            0.423 m AV Peak Grad:      14.3 mmHg AV Mean Grad:      7.0 mmHg LVOT Vmax:         57.40 cm/s LVOT Vmean:        41.600 cm/s LVOT VTI:          0.150 m LVOT/AV VTI ratio: 0.35 TRICUSPID VALVE TR Peak grad:   40.7 mmHg TR Vmax:        319.00 cm/s  SHUNTS Systemic VTI: 0.15 m Eleonore Chiquito MD Electronically signed by Eleonore Chiquito MD Signature Date/Time: 04/01/2019/1:15:41 PM    Final     (Echo, Carotid, EGD, Colonoscopy, ERCP)    Subjective: She is resting in bed in no acute distress  Discharge Exam: Vitals:   04/14/19 0601 04/14/19 0655  BP: (!) 158/72   Pulse: (!) 58   Resp: 18   Temp:  (!) 96.1 F (35.6 C)  SpO2: 93%    Vitals:   04/13/19 1345 04/13/19 2124 04/14/19 0601 04/14/19 0655  BP: (!) 175/73 (!) 163/75 (!) 158/72   Pulse: (!) 58 (!) 56 (!) 58   Resp: 18 16 18    Temp: (!) 97.4 F (36.3 C) (!) 97.4 F (36.3 C)  (!) 96.1 F (35.6 C)  TempSrc: Oral Oral     SpO2: 94% 97% 93%   Weight:      Height:        General: Pt is alert, awake, not in acute distress Cardiovascular: RRR, S1/S2 +, no rubs, no gallops Respiratory: CTA bilaterally, no wheezing, no rhonchi Abdominal: Soft, NT, ND, bowel sounds + Extremities: no edema, no cyanosis    The results of significant diagnostics from this hospitalization (including imaging, microbiology, ancillary and laboratory) are listed below for reference.     Microbiology: Recent Results (from the past 240 hour(s))  Culture, Urine     Status: None   Collection Time: 04/07/19 10:41 AM   Specimen: Urine, Clean Catch  Result Value Ref Range Status   Specimen Description   Final    URINE, CLEAN CATCH Performed at [facility], [place] [address], [place], [place] [address]    Special Requests   Final    NONE Performed at [facility], [place] [address], [place], [place] [address]    Culture   Final    NO GROWTH Performed at [facility], [place] [address] [place], [place] [address]    Report Status 04/08/2019 FINAL  Final  Culture, blood (Routine X 2) w Reflex to ID Panel     Status: None   Collection Time: 04/07/19 11:30 AM   Specimen: BLOOD  Result Value Ref Range Status   Specimen Description   Final    BLOOD RIGHT ANTECUBITAL Performed at [place] [address] [place], [place] [address]    Special Requests   Final    BOTTLES DRAWN AEROBIC AND ANAEROBIC Blood Culture adequate volume Performed at [facility] [address] [place], [place] [address]    Culture   Final    NO GROWTH 5 DAYS Performed at [facility], [place] [address] [place], [place] [address]  Report Status 04/12/2019 FINAL  Final  Culture, blood (Routine X 2) w Reflex to ID Panel     Status: None   Collection Time: 04/07/19 11:49 AM   Specimen: BLOOD  Result Value Ref Range Status   Specimen Description   Final    BLOOD RIGHT  ANTECUBITAL Performed at [place] [address], [place], [place] [address]    Special Requests   Final    BOTTLES DRAWN AEROBIC ONLY Blood Culture adequate volume Performed at [place] [address] [place], [place] [address]    Culture   Final    NO GROWTH 5 DAYS Performed at [facility], [place] [address] [place],  [address]    Report Status 04/12/2019 FINAL  Final     Labs: BNP (last 3 results) Recent Labs    03/08/19 1947 03/30/19 1730  BNP 1,203.1* [id number]*   Basic Metabolic Panel: Recent Labs  Lab 04/08/19 0412 04/08/19 0954 04/09/19 0443 04/10/19 0431 04/10/19 0745 04/11/19 0449  NA 143  --  137 QUESTIONABLE RESULTS, RECOMMEND RECOLLECT TO VERIFY 137 138  K 3.9  --  4.9 QUESTIONABLE RESULTS, RECOMMEND RECOLLECT TO VERIFY 4.4 4.2  CL 112*  --  108 QUESTIONABLE RESULTS, RECOMMEND RECOLLECT TO VERIFY 106 106  CO2 23  --  23 QUESTIONABLE RESULTS, RECOMMEND RECOLLECT TO VERIFY 23 22  GLUCOSE 56*  --  152* QUESTIONABLE RESULTS, RECOMMEND RECOLLECT TO VERIFY 94 108*  BUN 25*  --  19 QUESTIONABLE RESULTS, RECOMMEND RECOLLECT TO VERIFY 14 12  CREATININE 0.68  --  0.77 QUESTIONABLE RESULTS, RECOMMEND RECOLLECT TO VERIFY 0.69 0.53  CALCIUM 8.0*  --  8.1* QUESTIONABLE RESULTS, RECOMMEND RECOLLECT TO VERIFY 8.1* 8.1*  MG  --  1.6* 2.7* QUESTIONABLE RESULTS, RECOMMEND RECOLLECT TO VERIFY 2.2  --    Liver Function Tests: No results for input(s): AST, ALT, ALKPHOS, BILITOT, PROT, ALBUMIN in the last 168 hours. No results for input(s): LIPASE, AMYLASE in the last 168 hours. No results for input(s): AMMONIA in the last 168 hours. CBC: Recent Labs  Lab 04/08/19 0412 04/09/19 0443 04/10/19 0431 04/10/19 0745 04/11/19 0449  WBC 1.8* 1.8* QUESTIONABLE RESULTS, RECOMMEND RECOLLECT TO VERIFY 1.6* 2.2*  NEUTROABS 1.2* 1.1* QUESTIONABLE RESULTS, RECOMMEND RECOLLECT TO VERIFY 0.9* 1.4*  HGB 9.0* 9.1* QUESTIONABLE  RESULTS, RECOMMEND RECOLLECT TO VERIFY 9.1* 9.6*  HCT 30.5* 31.3* QUESTIONABLE RESULTS, RECOMMEND RECOLLECT TO VERIFY 30.3* 33.3*  MCV 90.0 91.8 QUESTIONABLE RESULTS, RECOMMEND RECOLLECT TO VERIFY 91.3 91.0  PLT 1,128* 1,106* QUESTIONABLE RESULTS, RECOMMEND RECOLLECT TO VERIFY 1,148* 1,136*   Cardiac Enzymes: No results for input(s): CKTOTAL, CKMB, CKMBINDEX, TROPONINI in the last 168 hours. BNP: Invalid input(s): POCBNP CBG: Recent Labs  Lab 04/13/19 2118 04/14/19 0126 04/14/19 0243 04/14/19 0322 04/14/19 0742  GLUCAP 87 66* 65* 92 69*   D-Dimer No results for input(s): DDIMER in the last 72 hours. Hgb A1c No results for input(s): HGBA1C in the last 72 hours. Lipid Profile No results for input(s): CHOL, HDL, LDLCALC, TRIG, CHOLHDL, LDLDIRECT in the last 72 hours. Thyroid function studies No results for input(s): TSH, T4TOTAL, T3FREE, THYROIDAB in the last 72 hours.  Invalid input(s): FREET3 Anemia work up No results for input(s): VITAMINB12, FOLATE, FERRITIN, TIBC, IRON, RETICCTPCT in the last 72 hours. Urinalysis    Component Value Date/Time   COLORURINE YELLOW 04/07/2019 1041   APPEARANCEUR CLEAR 04/07/2019 1041   LABSPEC 1.015 04/07/2019 1041   PHURINE 7.0 04/07/2019 1041  GLUCOSEU NEGATIVE 04/07/2019 [place] 01/19/2017 1346   HGBUR SMALL (A) 04/07/2019 1041   BILIRUBINUR NEGATIVE 04/07/2019 1041   BILIRUBINUR neg 03/09/2013 1447   KETONESUR NEGATIVE 04/07/2019 1041   PROTEINUR 100 (A) 04/07/2019 1041   UROBILINOGEN 0.2 01/19/2017 1346   NITRITE NEGATIVE 04/07/2019 1041   LEUKOCYTESUR NEGATIVE 04/07/2019 1041   Sepsis Labs Invalid input(s): PROCALCITONIN,  WBC,  LACTICIDVEN Microbiology Recent Results (from the past 240 hour(s))  Culture, Urine     Status: None   Collection Time: 04/07/19 10:41 AM   Specimen: Urine, Clean Catch  Result Value Ref Range Status   Specimen Description   Final    URINE, CLEAN CATCH Performed at [facility], [place] [address] [place], [place] [address]    Special Requests   Final    NONE Performed at [facility], [place] [address] [place], [place] [address]    Culture   Final    NO GROWTH Performed at [facility], [place] [address], [place], [place] [address]    Report Status 04/08/2019 FINAL  Final  Culture, blood (Routine X 2) w Reflex to ID Panel     Status: None   Collection Time: 04/07/19 11:30 AM   Specimen: BLOOD  Result Value Ref Range Status   Specimen Description   Final    BLOOD RIGHT ANTECUBITAL Performed at [place] [address], [place], [place] [address]    Special Requests   Final    BOTTLES DRAWN AEROBIC AND ANAEROBIC Blood Culture adequate volume Performed at [place] [address], [place], [place] [address]    Culture   Final    NO GROWTH 5 DAYS Performed at [facility], [place] [address], [place], [place] [address]    Report Status 04/12/2019 FINAL  Final  Culture, blood (Routine X 2) w Reflex to ID Panel     Status: None   Collection Time: 04/07/19 11:49 AM   Specimen: BLOOD  Result Value Ref Range Status   Specimen Description   Final    BLOOD RIGHT ANTECUBITAL Performed at [facility] [address], [place], [place] [address]    Special Requests   Final    BOTTLES DRAWN AEROBIC ONLY Blood Culture adequate volume Performed at [place] [address], [place], [place] [address]    Culture   Final    NO GROWTH 5 DAYS Performed at [facility], [place] [address], [place], [place] [address]    Report Status 04/12/2019 FINAL  Final     Time coordinating discharge:  37 minutes  SIGNED:   Georgette Shell, MD  [facility] 04/14/2019, 9:53 AM Pager   If 7PM-7AM, please contact night-coverage [email or web address] Password [email or web address]

## 2019-04-16 DIAGNOSIS — I5033 Acute on chronic diastolic (congestive) heart failure: Secondary | ICD-10-CM | POA: Diagnosis not present

## 2019-04-16 DIAGNOSIS — R64 Cachexia: Secondary | ICD-10-CM | POA: Diagnosis not present

## 2019-04-16 DIAGNOSIS — I2721 Secondary pulmonary arterial hypertension: Secondary | ICD-10-CM | POA: Diagnosis not present

## 2019-04-16 DIAGNOSIS — E162 Hypoglycemia, unspecified: Secondary | ICD-10-CM | POA: Diagnosis not present

## 2019-04-16 DIAGNOSIS — E878 Other disorders of electrolyte and fluid balance, not elsewhere classified: Secondary | ICD-10-CM | POA: Diagnosis not present

## 2019-04-17 DIAGNOSIS — J9601 Acute respiratory failure with hypoxia: Secondary | ICD-10-CM | POA: Diagnosis not present

## 2019-04-17 DIAGNOSIS — I2699 Other pulmonary embolism without acute cor pulmonale: Secondary | ICD-10-CM | POA: Diagnosis not present

## 2019-04-20 DIAGNOSIS — D6489 Other specified anemias: Secondary | ICD-10-CM | POA: Diagnosis not present

## 2019-04-20 DIAGNOSIS — D72819 Decreased white blood cell count, unspecified: Secondary | ICD-10-CM | POA: Diagnosis not present

## 2019-04-20 DIAGNOSIS — D473 Essential (hemorrhagic) thrombocythemia: Secondary | ICD-10-CM | POA: Diagnosis not present

## 2019-04-24 DIAGNOSIS — D72819 Decreased white blood cell count, unspecified: Secondary | ICD-10-CM | POA: Diagnosis not present

## 2019-04-24 DIAGNOSIS — R300 Dysuria: Secondary | ICD-10-CM | POA: Diagnosis not present

## 2019-04-24 DIAGNOSIS — I110 Hypertensive heart disease with heart failure: Secondary | ICD-10-CM | POA: Diagnosis not present

## 2019-04-24 DIAGNOSIS — D473 Essential (hemorrhagic) thrombocythemia: Secondary | ICD-10-CM | POA: Diagnosis not present

## 2019-04-26 ENCOUNTER — Telehealth: Payer: Self-pay

## 2019-04-26 DIAGNOSIS — L89214 Pressure ulcer of right hip, stage 4: Secondary | ICD-10-CM | POA: Diagnosis not present

## 2019-04-26 DIAGNOSIS — L89303 Pressure ulcer of unspecified buttock, stage 3: Secondary | ICD-10-CM | POA: Diagnosis not present

## 2019-04-26 DIAGNOSIS — L89154 Pressure ulcer of sacral region, stage 4: Secondary | ICD-10-CM | POA: Diagnosis not present

## 2019-04-26 DIAGNOSIS — L89893 Pressure ulcer of other site, stage 3: Secondary | ICD-10-CM | POA: Diagnosis not present

## 2019-04-26 NOTE — Telephone Encounter (Signed)
I dont have any influence over the hours being able to be done by the [facility].  [place] for handicapped parking form to be filled out to be signed and sent to pt

## 2019-04-26 NOTE — Telephone Encounter (Signed)
  New message   Son calling with additional questions  1. Handicap sticker forms need to be  complete  2. Checking on home health request for additional hours nursing assistance, the patient only getting 2 hours

## 2019-04-27 DIAGNOSIS — B3749 Other urogenital candidiasis: Secondary | ICD-10-CM | POA: Diagnosis not present

## 2019-04-27 DIAGNOSIS — R627 Adult failure to thrive: Secondary | ICD-10-CM | POA: Diagnosis not present

## 2019-04-27 DIAGNOSIS — J9601 Acute respiratory failure with hypoxia: Secondary | ICD-10-CM | POA: Diagnosis not present

## 2019-04-30 ENCOUNTER — Telehealth: Payer: Self-pay | Admitting: Internal Medicine

## 2019-04-30 NOTE — Telephone Encounter (Signed)
Handy cap sticker papers were filled out.

## 2019-04-30 NOTE — Telephone Encounter (Signed)
Same answer as before, I dont have control over this

## 2019-04-30 NOTE — Telephone Encounter (Signed)
New message:   Pt's son is calling and asking for an order for 5 hours for a nurse aid instead of 2 hours. He states they have to have an order to be able to stay past 2 hours. He also states she is currently in assisted living but will be coming home shortly. Please advise.

## 2019-05-01 NOTE — Telephone Encounter (Signed)
Left voicemail for pt son to call clinic concerning Angela [name] and his questions about the nurse aid hours.

## 2019-05-02 DIAGNOSIS — E440 Moderate protein-calorie malnutrition: Secondary | ICD-10-CM | POA: Diagnosis not present

## 2019-05-02 DIAGNOSIS — I2699 Other pulmonary embolism without acute cor pulmonale: Secondary | ICD-10-CM | POA: Diagnosis not present

## 2019-05-02 DIAGNOSIS — I110 Hypertensive heart disease with heart failure: Secondary | ICD-10-CM | POA: Diagnosis not present

## 2019-05-02 DIAGNOSIS — J1282 Pneumonia due to coronavirus disease 2019: Secondary | ICD-10-CM | POA: Diagnosis not present

## 2019-05-02 DIAGNOSIS — U071 COVID-19: Secondary | ICD-10-CM | POA: Diagnosis not present

## 2019-05-07 DIAGNOSIS — B3749 Other urogenital candidiasis: Secondary | ICD-10-CM | POA: Diagnosis not present

## 2019-05-07 DIAGNOSIS — I10 Essential (primary) hypertension: Secondary | ICD-10-CM | POA: Diagnosis not present

## 2019-05-09 DIAGNOSIS — I5033 Acute on chronic diastolic (congestive) heart failure: Secondary | ICD-10-CM | POA: Diagnosis not present

## 2019-05-09 DIAGNOSIS — I2699 Other pulmonary embolism without acute cor pulmonale: Secondary | ICD-10-CM | POA: Diagnosis not present

## 2019-05-09 DIAGNOSIS — J9601 Acute respiratory failure with hypoxia: Secondary | ICD-10-CM | POA: Diagnosis not present

## 2019-05-09 DIAGNOSIS — R600 Localized edema: Secondary | ICD-10-CM | POA: Diagnosis not present

## 2019-05-10 DIAGNOSIS — R600 Localized edema: Secondary | ICD-10-CM | POA: Diagnosis not present

## 2019-05-10 DIAGNOSIS — I2699 Other pulmonary embolism without acute cor pulmonale: Secondary | ICD-10-CM | POA: Diagnosis not present

## 2019-05-10 DIAGNOSIS — I5033 Acute on chronic diastolic (congestive) heart failure: Secondary | ICD-10-CM | POA: Diagnosis not present

## 2019-05-16 DIAGNOSIS — E875 Hyperkalemia: Secondary | ICD-10-CM | POA: Diagnosis not present

## 2019-05-17 DIAGNOSIS — R1084 Generalized abdominal pain: Secondary | ICD-10-CM | POA: Diagnosis not present

## 2019-05-17 DIAGNOSIS — D72818 Other decreased white blood cell count: Secondary | ICD-10-CM | POA: Diagnosis not present

## 2019-05-17 DIAGNOSIS — R600 Localized edema: Secondary | ICD-10-CM | POA: Diagnosis not present

## 2019-05-17 DIAGNOSIS — E875 Hyperkalemia: Secondary | ICD-10-CM | POA: Diagnosis not present

## 2019-05-17 DIAGNOSIS — D473 Essential (hemorrhagic) thrombocythemia: Secondary | ICD-10-CM | POA: Diagnosis not present

## 2019-05-18 DIAGNOSIS — R627 Adult failure to thrive: Secondary | ICD-10-CM | POA: Diagnosis not present

## 2019-05-18 DIAGNOSIS — K5909 Other constipation: Secondary | ICD-10-CM | POA: Diagnosis not present

## 2019-05-18 DIAGNOSIS — R1084 Generalized abdominal pain: Secondary | ICD-10-CM | POA: Diagnosis not present

## 2019-05-19 DIAGNOSIS — L89610 Pressure ulcer of right heel, unstageable: Secondary | ICD-10-CM | POA: Diagnosis not present

## 2019-05-19 DIAGNOSIS — L89154 Pressure ulcer of sacral region, stage 4: Secondary | ICD-10-CM | POA: Diagnosis not present

## 2019-05-19 DIAGNOSIS — L89214 Pressure ulcer of right hip, stage 4: Secondary | ICD-10-CM | POA: Diagnosis not present

## 2019-05-21 DIAGNOSIS — D649 Anemia, unspecified: Secondary | ICD-10-CM | POA: Diagnosis not present

## 2019-05-21 DIAGNOSIS — E785 Hyperlipidemia, unspecified: Secondary | ICD-10-CM | POA: Diagnosis not present

## 2019-05-21 DIAGNOSIS — E440 Moderate protein-calorie malnutrition: Secondary | ICD-10-CM | POA: Diagnosis not present

## 2019-05-22 DIAGNOSIS — D72819 Decreased white blood cell count, unspecified: Secondary | ICD-10-CM | POA: Diagnosis not present

## 2019-05-22 DIAGNOSIS — R110 Nausea: Secondary | ICD-10-CM | POA: Diagnosis not present

## 2019-05-22 DIAGNOSIS — K5909 Other constipation: Secondary | ICD-10-CM | POA: Diagnosis not present

## 2019-05-22 DIAGNOSIS — L89303 Pressure ulcer of unspecified buttock, stage 3: Secondary | ICD-10-CM | POA: Diagnosis not present

## 2019-05-22 DIAGNOSIS — D473 Essential (hemorrhagic) thrombocythemia: Secondary | ICD-10-CM | POA: Diagnosis not present

## 2019-05-24 DIAGNOSIS — D6489 Other specified anemias: Secondary | ICD-10-CM | POA: Diagnosis not present

## 2019-05-24 DIAGNOSIS — E785 Hyperlipidemia, unspecified: Secondary | ICD-10-CM | POA: Diagnosis not present

## 2019-05-24 DIAGNOSIS — R1084 Generalized abdominal pain: Secondary | ICD-10-CM | POA: Diagnosis not present

## 2019-05-24 DIAGNOSIS — K5909 Other constipation: Secondary | ICD-10-CM | POA: Diagnosis not present

## 2019-05-24 DIAGNOSIS — K645 Perianal venous thrombosis: Secondary | ICD-10-CM | POA: Diagnosis not present

## 2019-05-24 DIAGNOSIS — E440 Moderate protein-calorie malnutrition: Secondary | ICD-10-CM | POA: Diagnosis not present

## 2019-05-28 DIAGNOSIS — L89154 Pressure ulcer of sacral region, stage 4: Secondary | ICD-10-CM | POA: Diagnosis not present

## 2019-05-28 DIAGNOSIS — L89610 Pressure ulcer of right heel, unstageable: Secondary | ICD-10-CM | POA: Diagnosis not present

## 2019-05-28 DIAGNOSIS — L89214 Pressure ulcer of right hip, stage 4: Secondary | ICD-10-CM | POA: Diagnosis not present

## 2019-05-29 DIAGNOSIS — R627 Adult failure to thrive: Secondary | ICD-10-CM | POA: Diagnosis not present

## 2019-05-29 DIAGNOSIS — I10 Essential (primary) hypertension: Secondary | ICD-10-CM | POA: Diagnosis not present

## 2019-05-29 DIAGNOSIS — I110 Hypertensive heart disease with heart failure: Secondary | ICD-10-CM | POA: Diagnosis not present

## 2019-05-30 DIAGNOSIS — K5909 Other constipation: Secondary | ICD-10-CM | POA: Diagnosis not present

## 2019-05-30 DIAGNOSIS — K5901 Slow transit constipation: Secondary | ICD-10-CM | POA: Diagnosis not present

## 2019-05-30 DIAGNOSIS — K645 Perianal venous thrombosis: Secondary | ICD-10-CM | POA: Diagnosis not present

## 2019-05-30 DIAGNOSIS — R600 Localized edema: Secondary | ICD-10-CM | POA: Diagnosis not present

## 2019-05-30 DIAGNOSIS — I5031 Acute diastolic (congestive) heart failure: Secondary | ICD-10-CM | POA: Diagnosis not present

## 2019-05-31 DIAGNOSIS — R627 Adult failure to thrive: Secondary | ICD-10-CM | POA: Diagnosis not present

## 2019-05-31 DIAGNOSIS — D649 Anemia, unspecified: Secondary | ICD-10-CM | POA: Diagnosis not present

## 2019-05-31 DIAGNOSIS — I110 Hypertensive heart disease with heart failure: Secondary | ICD-10-CM | POA: Diagnosis not present

## 2019-05-31 DIAGNOSIS — R109 Unspecified abdominal pain: Secondary | ICD-10-CM | POA: Diagnosis not present

## 2019-05-31 DIAGNOSIS — I5033 Acute on chronic diastolic (congestive) heart failure: Secondary | ICD-10-CM | POA: Diagnosis not present

## 2019-06-01 DIAGNOSIS — I5033 Acute on chronic diastolic (congestive) heart failure: Secondary | ICD-10-CM | POA: Diagnosis not present

## 2019-06-01 DIAGNOSIS — R600 Localized edema: Secondary | ICD-10-CM | POA: Diagnosis not present

## 2019-06-01 DIAGNOSIS — I10 Essential (primary) hypertension: Secondary | ICD-10-CM | POA: Diagnosis not present

## 2019-06-01 DIAGNOSIS — I110 Hypertensive heart disease with heart failure: Secondary | ICD-10-CM | POA: Diagnosis not present

## 2019-06-04 DIAGNOSIS — L89154 Pressure ulcer of sacral region, stage 4: Secondary | ICD-10-CM | POA: Diagnosis not present

## 2019-06-04 DIAGNOSIS — L89610 Pressure ulcer of right heel, unstageable: Secondary | ICD-10-CM | POA: Diagnosis not present

## 2019-06-04 DIAGNOSIS — L89214 Pressure ulcer of right hip, stage 4: Secondary | ICD-10-CM | POA: Diagnosis not present

## 2019-06-05 DIAGNOSIS — L89154 Pressure ulcer of sacral region, stage 4: Secondary | ICD-10-CM | POA: Diagnosis not present

## 2019-06-05 DIAGNOSIS — L89214 Pressure ulcer of right hip, stage 4: Secondary | ICD-10-CM | POA: Diagnosis not present

## 2019-06-05 DIAGNOSIS — E038 Other specified hypothyroidism: Secondary | ICD-10-CM | POA: Diagnosis not present

## 2019-06-05 DIAGNOSIS — E440 Moderate protein-calorie malnutrition: Secondary | ICD-10-CM | POA: Diagnosis not present

## 2019-06-05 DIAGNOSIS — I2699 Other pulmonary embolism without acute cor pulmonale: Secondary | ICD-10-CM | POA: Diagnosis not present

## 2019-06-05 DIAGNOSIS — I5031 Acute diastolic (congestive) heart failure: Secondary | ICD-10-CM | POA: Diagnosis not present

## 2019-06-05 DIAGNOSIS — L89610 Pressure ulcer of right heel, unstageable: Secondary | ICD-10-CM | POA: Diagnosis not present

## 2019-06-05 DIAGNOSIS — R627 Adult failure to thrive: Secondary | ICD-10-CM | POA: Diagnosis not present

## 2019-06-06 DIAGNOSIS — G8929 Other chronic pain: Secondary | ICD-10-CM | POA: Diagnosis not present

## 2019-06-06 DIAGNOSIS — R1319 Other dysphagia: Secondary | ICD-10-CM | POA: Diagnosis not present

## 2019-06-11 DIAGNOSIS — L89610 Pressure ulcer of right heel, unstageable: Secondary | ICD-10-CM | POA: Diagnosis not present

## 2019-06-11 DIAGNOSIS — L89214 Pressure ulcer of right hip, stage 4: Secondary | ICD-10-CM | POA: Diagnosis not present

## 2019-06-11 DIAGNOSIS — L89154 Pressure ulcer of sacral region, stage 4: Secondary | ICD-10-CM | POA: Diagnosis not present

## 2019-06-14 DIAGNOSIS — M6281 Muscle weakness (generalized): Secondary | ICD-10-CM | POA: Diagnosis not present

## 2019-06-14 DIAGNOSIS — G8929 Other chronic pain: Secondary | ICD-10-CM | POA: Diagnosis not present

## 2019-06-14 DIAGNOSIS — Z7409 Other reduced mobility: Secondary | ICD-10-CM | POA: Diagnosis not present

## 2019-06-14 DIAGNOSIS — R1312 Dysphagia, oropharyngeal phase: Secondary | ICD-10-CM | POA: Diagnosis not present

## 2019-06-14 DIAGNOSIS — R1310 Dysphagia, unspecified: Secondary | ICD-10-CM | POA: Diagnosis not present

## 2019-06-14 DIAGNOSIS — I5030 Unspecified diastolic (congestive) heart failure: Secondary | ICD-10-CM | POA: Diagnosis not present

## 2019-06-14 DIAGNOSIS — M24562 Contracture, left knee: Secondary | ICD-10-CM | POA: Diagnosis not present

## 2019-06-14 DIAGNOSIS — J9601 Acute respiratory failure with hypoxia: Secondary | ICD-10-CM | POA: Diagnosis not present

## 2019-06-14 DIAGNOSIS — I5031 Acute diastolic (congestive) heart failure: Secondary | ICD-10-CM | POA: Diagnosis not present

## 2019-06-14 DIAGNOSIS — M24561 Contracture, right knee: Secondary | ICD-10-CM | POA: Diagnosis not present

## 2019-06-15 DIAGNOSIS — J9601 Acute respiratory failure with hypoxia: Secondary | ICD-10-CM | POA: Diagnosis not present

## 2019-06-15 DIAGNOSIS — Z7409 Other reduced mobility: Secondary | ICD-10-CM | POA: Diagnosis not present

## 2019-06-15 DIAGNOSIS — M24561 Contracture, right knee: Secondary | ICD-10-CM | POA: Diagnosis not present

## 2019-06-15 DIAGNOSIS — I5031 Acute diastolic (congestive) heart failure: Secondary | ICD-10-CM | POA: Diagnosis not present

## 2019-06-15 DIAGNOSIS — M6281 Muscle weakness (generalized): Secondary | ICD-10-CM | POA: Diagnosis not present

## 2019-06-15 DIAGNOSIS — R1312 Dysphagia, oropharyngeal phase: Secondary | ICD-10-CM | POA: Diagnosis not present

## 2019-06-15 DIAGNOSIS — M24562 Contracture, left knee: Secondary | ICD-10-CM | POA: Diagnosis not present

## 2019-06-15 DIAGNOSIS — E162 Hypoglycemia, unspecified: Secondary | ICD-10-CM | POA: Diagnosis not present

## 2019-06-15 DIAGNOSIS — Z7189 Other specified counseling: Secondary | ICD-10-CM | POA: Diagnosis not present

## 2019-06-15 DIAGNOSIS — R627 Adult failure to thrive: Secondary | ICD-10-CM | POA: Diagnosis not present

## 2019-06-18 DIAGNOSIS — M6281 Muscle weakness (generalized): Secondary | ICD-10-CM | POA: Diagnosis not present

## 2019-06-18 DIAGNOSIS — J9601 Acute respiratory failure with hypoxia: Secondary | ICD-10-CM | POA: Diagnosis not present

## 2019-06-18 DIAGNOSIS — I5031 Acute diastolic (congestive) heart failure: Secondary | ICD-10-CM | POA: Diagnosis not present

## 2019-06-18 DIAGNOSIS — R1312 Dysphagia, oropharyngeal phase: Secondary | ICD-10-CM | POA: Diagnosis not present

## 2019-06-18 DIAGNOSIS — M24562 Contracture, left knee: Secondary | ICD-10-CM | POA: Diagnosis not present

## 2019-06-18 DIAGNOSIS — L89610 Pressure ulcer of right heel, unstageable: Secondary | ICD-10-CM | POA: Diagnosis not present

## 2019-06-18 DIAGNOSIS — L89214 Pressure ulcer of right hip, stage 4: Secondary | ICD-10-CM | POA: Diagnosis not present

## 2019-06-18 DIAGNOSIS — L89154 Pressure ulcer of sacral region, stage 4: Secondary | ICD-10-CM | POA: Diagnosis not present

## 2019-06-18 DIAGNOSIS — M24561 Contracture, right knee: Secondary | ICD-10-CM | POA: Diagnosis not present

## 2019-06-18 DIAGNOSIS — Z7409 Other reduced mobility: Secondary | ICD-10-CM | POA: Diagnosis not present

## 2019-06-19 ENCOUNTER — Telehealth: Payer: Self-pay | Admitting: Oncology

## 2019-06-19 ENCOUNTER — Encounter: Payer: Self-pay | Admitting: Oncology

## 2019-06-19 DIAGNOSIS — E162 Hypoglycemia, unspecified: Secondary | ICD-10-CM | POA: Diagnosis not present

## 2019-06-19 DIAGNOSIS — I110 Hypertensive heart disease with heart failure: Secondary | ICD-10-CM | POA: Diagnosis not present

## 2019-06-19 NOTE — Telephone Encounter (Signed)
A new pt appt has been scheduled for Angela [name] to see Dr. Alen Blew on 6/30 at 2pm. A letter has been faxed to [facility] with the aptp date and time.

## 2019-06-21 DIAGNOSIS — M1990 Unspecified osteoarthritis, unspecified site: Secondary | ICD-10-CM | POA: Diagnosis not present

## 2019-06-21 DIAGNOSIS — M0549 Rheumatoid myopathy with rheumatoid arthritis of multiple sites: Secondary | ICD-10-CM | POA: Diagnosis not present

## 2019-06-21 DIAGNOSIS — R269 Unspecified abnormalities of gait and mobility: Secondary | ICD-10-CM | POA: Diagnosis not present

## 2019-06-21 DIAGNOSIS — E161 Other hypoglycemia: Secondary | ICD-10-CM | POA: Diagnosis not present

## 2019-06-21 DIAGNOSIS — I5033 Acute on chronic diastolic (congestive) heart failure: Secondary | ICD-10-CM | POA: Diagnosis not present

## 2019-06-21 DIAGNOSIS — M069 Rheumatoid arthritis, unspecified: Secondary | ICD-10-CM | POA: Diagnosis not present

## 2019-06-21 DIAGNOSIS — R627 Adult failure to thrive: Secondary | ICD-10-CM | POA: Diagnosis not present

## 2019-06-21 DIAGNOSIS — E875 Hyperkalemia: Secondary | ICD-10-CM | POA: Diagnosis not present

## 2019-06-25 DIAGNOSIS — M6281 Muscle weakness (generalized): Secondary | ICD-10-CM | POA: Diagnosis not present

## 2019-06-25 DIAGNOSIS — L89214 Pressure ulcer of right hip, stage 4: Secondary | ICD-10-CM | POA: Diagnosis not present

## 2019-06-25 DIAGNOSIS — J9601 Acute respiratory failure with hypoxia: Secondary | ICD-10-CM | POA: Diagnosis not present

## 2019-06-25 DIAGNOSIS — M24561 Contracture, right knee: Secondary | ICD-10-CM | POA: Diagnosis not present

## 2019-06-25 DIAGNOSIS — Z7409 Other reduced mobility: Secondary | ICD-10-CM | POA: Diagnosis not present

## 2019-06-25 DIAGNOSIS — R1312 Dysphagia, oropharyngeal phase: Secondary | ICD-10-CM | POA: Diagnosis not present

## 2019-06-25 DIAGNOSIS — L89610 Pressure ulcer of right heel, unstageable: Secondary | ICD-10-CM | POA: Diagnosis not present

## 2019-06-25 DIAGNOSIS — L89154 Pressure ulcer of sacral region, stage 4: Secondary | ICD-10-CM | POA: Diagnosis not present

## 2019-06-25 DIAGNOSIS — M24562 Contracture, left knee: Secondary | ICD-10-CM | POA: Diagnosis not present

## 2019-06-25 DIAGNOSIS — I5031 Acute diastolic (congestive) heart failure: Secondary | ICD-10-CM | POA: Diagnosis not present

## 2019-06-26 DIAGNOSIS — G8929 Other chronic pain: Secondary | ICD-10-CM | POA: Diagnosis not present

## 2019-06-26 DIAGNOSIS — M24562 Contracture, left knee: Secondary | ICD-10-CM | POA: Diagnosis not present

## 2019-06-26 DIAGNOSIS — J9601 Acute respiratory failure with hypoxia: Secondary | ICD-10-CM | POA: Diagnosis not present

## 2019-06-26 DIAGNOSIS — I5031 Acute diastolic (congestive) heart failure: Secondary | ICD-10-CM | POA: Diagnosis not present

## 2019-06-26 DIAGNOSIS — I10 Essential (primary) hypertension: Secondary | ICD-10-CM | POA: Diagnosis not present

## 2019-06-26 DIAGNOSIS — M6281 Muscle weakness (generalized): Secondary | ICD-10-CM | POA: Diagnosis not present

## 2019-06-26 DIAGNOSIS — M24561 Contracture, right knee: Secondary | ICD-10-CM | POA: Diagnosis not present

## 2019-06-26 DIAGNOSIS — Z7409 Other reduced mobility: Secondary | ICD-10-CM | POA: Diagnosis not present

## 2019-06-26 DIAGNOSIS — R1312 Dysphagia, oropharyngeal phase: Secondary | ICD-10-CM | POA: Diagnosis not present

## 2019-06-26 DIAGNOSIS — E875 Hyperkalemia: Secondary | ICD-10-CM | POA: Diagnosis not present

## 2019-06-26 DIAGNOSIS — Z7189 Other specified counseling: Secondary | ICD-10-CM | POA: Diagnosis not present

## 2019-06-27 DIAGNOSIS — J9601 Acute respiratory failure with hypoxia: Secondary | ICD-10-CM | POA: Diagnosis not present

## 2019-06-27 DIAGNOSIS — M24561 Contracture, right knee: Secondary | ICD-10-CM | POA: Diagnosis not present

## 2019-06-27 DIAGNOSIS — I5031 Acute diastolic (congestive) heart failure: Secondary | ICD-10-CM | POA: Diagnosis not present

## 2019-06-27 DIAGNOSIS — M6281 Muscle weakness (generalized): Secondary | ICD-10-CM | POA: Diagnosis not present

## 2019-06-27 DIAGNOSIS — R1312 Dysphagia, oropharyngeal phase: Secondary | ICD-10-CM | POA: Diagnosis not present

## 2019-06-27 DIAGNOSIS — Z7409 Other reduced mobility: Secondary | ICD-10-CM | POA: Diagnosis not present

## 2019-06-27 DIAGNOSIS — E440 Moderate protein-calorie malnutrition: Secondary | ICD-10-CM | POA: Diagnosis not present

## 2019-06-27 DIAGNOSIS — M24562 Contracture, left knee: Secondary | ICD-10-CM | POA: Diagnosis not present

## 2019-06-27 DIAGNOSIS — L8990 Pressure ulcer of unspecified site, unspecified stage: Secondary | ICD-10-CM | POA: Diagnosis not present

## 2019-06-27 DIAGNOSIS — D649 Anemia, unspecified: Secondary | ICD-10-CM | POA: Diagnosis not present

## 2019-06-28 DIAGNOSIS — N178 Other acute kidney failure: Secondary | ICD-10-CM | POA: Diagnosis not present

## 2019-06-28 DIAGNOSIS — D649 Anemia, unspecified: Secondary | ICD-10-CM | POA: Diagnosis not present

## 2019-06-28 DIAGNOSIS — L89610 Pressure ulcer of right heel, unstageable: Secondary | ICD-10-CM | POA: Diagnosis not present

## 2019-06-28 DIAGNOSIS — R627 Adult failure to thrive: Secondary | ICD-10-CM | POA: Diagnosis not present

## 2019-06-28 DIAGNOSIS — L89214 Pressure ulcer of right hip, stage 4: Secondary | ICD-10-CM | POA: Diagnosis not present

## 2019-06-28 DIAGNOSIS — E161 Other hypoglycemia: Secondary | ICD-10-CM | POA: Diagnosis not present

## 2019-06-28 DIAGNOSIS — L89154 Pressure ulcer of sacral region, stage 4: Secondary | ICD-10-CM | POA: Diagnosis not present

## 2019-06-28 DIAGNOSIS — Z7189 Other specified counseling: Secondary | ICD-10-CM | POA: Diagnosis not present

## 2019-06-28 DIAGNOSIS — E875 Hyperkalemia: Secondary | ICD-10-CM | POA: Diagnosis not present

## 2019-06-29 DIAGNOSIS — N178 Other acute kidney failure: Secondary | ICD-10-CM | POA: Diagnosis not present

## 2019-06-29 DIAGNOSIS — R627 Adult failure to thrive: Secondary | ICD-10-CM | POA: Diagnosis not present

## 2019-06-29 DIAGNOSIS — G8929 Other chronic pain: Secondary | ICD-10-CM | POA: Diagnosis not present

## 2019-06-29 DIAGNOSIS — R4702 Dysphasia: Secondary | ICD-10-CM | POA: Diagnosis not present

## 2019-07-02 DIAGNOSIS — M6281 Muscle weakness (generalized): Secondary | ICD-10-CM | POA: Diagnosis not present

## 2019-07-02 DIAGNOSIS — I5031 Acute diastolic (congestive) heart failure: Secondary | ICD-10-CM | POA: Diagnosis not present

## 2019-07-02 DIAGNOSIS — M24562 Contracture, left knee: Secondary | ICD-10-CM | POA: Diagnosis not present

## 2019-07-02 DIAGNOSIS — E875 Hyperkalemia: Secondary | ICD-10-CM | POA: Diagnosis not present

## 2019-07-02 DIAGNOSIS — L89610 Pressure ulcer of right heel, unstageable: Secondary | ICD-10-CM | POA: Diagnosis not present

## 2019-07-02 DIAGNOSIS — R627 Adult failure to thrive: Secondary | ICD-10-CM | POA: Diagnosis not present

## 2019-07-02 DIAGNOSIS — L89154 Pressure ulcer of sacral region, stage 4: Secondary | ICD-10-CM | POA: Diagnosis not present

## 2019-07-02 DIAGNOSIS — L89214 Pressure ulcer of right hip, stage 4: Secondary | ICD-10-CM | POA: Diagnosis not present

## 2019-07-02 DIAGNOSIS — R1312 Dysphagia, oropharyngeal phase: Secondary | ICD-10-CM | POA: Diagnosis not present

## 2019-07-02 DIAGNOSIS — M24561 Contracture, right knee: Secondary | ICD-10-CM | POA: Diagnosis not present

## 2019-07-02 DIAGNOSIS — R1319 Other dysphagia: Secondary | ICD-10-CM | POA: Diagnosis not present

## 2019-07-02 DIAGNOSIS — E162 Hypoglycemia, unspecified: Secondary | ICD-10-CM | POA: Diagnosis not present

## 2019-07-02 DIAGNOSIS — Z7409 Other reduced mobility: Secondary | ICD-10-CM | POA: Diagnosis not present

## 2019-07-02 DIAGNOSIS — J9601 Acute respiratory failure with hypoxia: Secondary | ICD-10-CM | POA: Diagnosis not present

## 2019-07-03 DIAGNOSIS — R1312 Dysphagia, oropharyngeal phase: Secondary | ICD-10-CM | POA: Diagnosis not present

## 2019-07-03 DIAGNOSIS — M6281 Muscle weakness (generalized): Secondary | ICD-10-CM | POA: Diagnosis not present

## 2019-07-03 DIAGNOSIS — D649 Anemia, unspecified: Secondary | ICD-10-CM | POA: Diagnosis not present

## 2019-07-03 DIAGNOSIS — N178 Other acute kidney failure: Secondary | ICD-10-CM | POA: Diagnosis not present

## 2019-07-03 DIAGNOSIS — J9601 Acute respiratory failure with hypoxia: Secondary | ICD-10-CM | POA: Diagnosis not present

## 2019-07-03 DIAGNOSIS — I5031 Acute diastolic (congestive) heart failure: Secondary | ICD-10-CM | POA: Diagnosis not present

## 2019-07-03 DIAGNOSIS — Z7409 Other reduced mobility: Secondary | ICD-10-CM | POA: Diagnosis not present

## 2019-07-03 DIAGNOSIS — E870 Hyperosmolality and hypernatremia: Secondary | ICD-10-CM | POA: Diagnosis not present

## 2019-07-03 DIAGNOSIS — M24562 Contracture, left knee: Secondary | ICD-10-CM | POA: Diagnosis not present

## 2019-07-03 DIAGNOSIS — Z515 Encounter for palliative care: Secondary | ICD-10-CM | POA: Diagnosis not present

## 2019-07-03 DIAGNOSIS — E875 Hyperkalemia: Secondary | ICD-10-CM | POA: Diagnosis not present

## 2019-07-03 DIAGNOSIS — M24561 Contracture, right knee: Secondary | ICD-10-CM | POA: Diagnosis not present

## 2019-07-04 ENCOUNTER — Other Ambulatory Visit: Payer: Self-pay

## 2019-07-04 ENCOUNTER — Non-Acute Institutional Stay: Payer: Medicare Other | Admitting: Internal Medicine

## 2019-07-04 ENCOUNTER — Inpatient Hospital Stay: Payer: Medicare Other | Attending: Oncology | Admitting: Oncology

## 2019-07-04 DIAGNOSIS — Z7901 Long term (current) use of anticoagulants: Secondary | ICD-10-CM

## 2019-07-04 DIAGNOSIS — D7589 Other specified diseases of blood and blood-forming organs: Secondary | ICD-10-CM | POA: Diagnosis not present

## 2019-07-04 DIAGNOSIS — R1312 Dysphagia, oropharyngeal phase: Secondary | ICD-10-CM | POA: Diagnosis not present

## 2019-07-04 DIAGNOSIS — M069 Rheumatoid arthritis, unspecified: Secondary | ICD-10-CM

## 2019-07-04 DIAGNOSIS — Z8261 Family history of arthritis: Secondary | ICD-10-CM

## 2019-07-04 DIAGNOSIS — Z7409 Other reduced mobility: Secondary | ICD-10-CM | POA: Diagnosis not present

## 2019-07-04 DIAGNOSIS — Z9012 Acquired absence of left breast and nipple: Secondary | ICD-10-CM | POA: Insufficient documentation

## 2019-07-04 DIAGNOSIS — I5031 Acute diastolic (congestive) heart failure: Secondary | ICD-10-CM | POA: Diagnosis not present

## 2019-07-04 DIAGNOSIS — Z853 Personal history of malignant neoplasm of breast: Secondary | ICD-10-CM | POA: Insufficient documentation

## 2019-07-04 DIAGNOSIS — D75839 Thrombocytosis, unspecified: Secondary | ICD-10-CM

## 2019-07-04 DIAGNOSIS — Z8616 Personal history of COVID-19: Secondary | ICD-10-CM | POA: Insufficient documentation

## 2019-07-04 DIAGNOSIS — F329 Major depressive disorder, single episode, unspecified: Secondary | ICD-10-CM | POA: Insufficient documentation

## 2019-07-04 DIAGNOSIS — Z833 Family history of diabetes mellitus: Secondary | ICD-10-CM | POA: Insufficient documentation

## 2019-07-04 DIAGNOSIS — Z87891 Personal history of nicotine dependence: Secondary | ICD-10-CM | POA: Diagnosis not present

## 2019-07-04 DIAGNOSIS — M6281 Muscle weakness (generalized): Secondary | ICD-10-CM | POA: Diagnosis not present

## 2019-07-04 DIAGNOSIS — Z79899 Other long term (current) drug therapy: Secondary | ICD-10-CM | POA: Insufficient documentation

## 2019-07-04 DIAGNOSIS — Z809 Family history of malignant neoplasm, unspecified: Secondary | ICD-10-CM | POA: Insufficient documentation

## 2019-07-04 DIAGNOSIS — M24562 Contracture, left knee: Secondary | ICD-10-CM | POA: Diagnosis not present

## 2019-07-04 DIAGNOSIS — M24561 Contracture, right knee: Secondary | ICD-10-CM | POA: Diagnosis not present

## 2019-07-04 DIAGNOSIS — Z515 Encounter for palliative care: Secondary | ICD-10-CM

## 2019-07-04 DIAGNOSIS — J9601 Acute respiratory failure with hypoxia: Secondary | ICD-10-CM | POA: Diagnosis not present

## 2019-07-04 DIAGNOSIS — Z86718 Personal history of other venous thrombosis and embolism: Secondary | ICD-10-CM | POA: Insufficient documentation

## 2019-07-04 DIAGNOSIS — R531 Weakness: Secondary | ICD-10-CM

## 2019-07-04 NOTE — Progress Notes (Signed)
Hematology and Oncology consult Telemedicine Visits  Angela [name] [id number] 1918-08-26 86 y.o. 07/04/2019 1:52 PM [name], MDJohn, Hunt Oris, MD   I connected [phone] on 07/04/19 at  2:00 PM EDT by video enabled telemedicine visit and verified that I am speaking with the correct person using two identifiers.  This was converted to a phone call because of technical difficulties.   I discussed the limitations, risks, security and privacy concerns of performing an evaluation and management service by telemedicine and the availability of in-person appointments. I also discussed with the patient that there may be a patient responsible charge related to this service. The patient expressed understanding and agreed to proceed.  Other persons participating in the visit and their role in the encounter: Son Angela [name]  Patient's location: Skilled nursing facility. Provider's location:  Office    Reason for the request:   Thrombocytosis  HPI: I was asked by Dr. Keenan Bachelor to evaluate Angela [name] for evaluation of thrombocytosis.  She is a 86 year old woman with history of few comorbid conditions including breast cancer and multiple hospitalizations earlier this year.  She had multifocal pneumonia as well as COVID-19 infection and required hospitalization in March and in April 2021.  During that time she was noted to have elevated platelet count of over 1 million.  CBC obtained on April 7 showed a platelet count of 1,136,000 with a hemoglobin of 9.6 and white cell count of 3.3.  Her platelet count were over million during her hospitalization in April and ranging between 700,000 and 1 million in March.  In August 2020 they were elevated at 945.  In 2019 and platelet count 366.  Prior CBC showed normal platelet count.  Since her discharge, she has been a resident at a skilled nursing facility and have been overall doing poorly.  She developed a lot of complaints of pain related to pressure ulcers due to lack of  mobility.     Past Medical History:  Diagnosis Date  . Adenocarcinoma of breast ([place])   . Allergic rhinitis   . Anemia   . Anxiety   . Depression   . Rheumatoid arthritis(714.0)   :  Past Surgical History:  Procedure Laterality Date  . ANKLE SURGERY    . CATARACT EXTRACTION    . masectomy     left breast  :   Current Outpatient Medications:  .  acetaminophen (TYLENOL) 325 MG tablet, Take 2 tablets (650 mg total) by mouth every 6 (six) hours as needed for mild pain, moderate pain, fever or headache (fever >/= 101)., Disp:  , Rfl:  .  apixaban (ELIQUIS) 5 MG TABS tablet, Take 1 tablet (5 mg total) by mouth 2 (two) times daily., Disp: 60 tablet, Rfl: 1 .  ascorbic acid (VITAMIN C) 500 MG tablet, Take 1 tablet (500 mg total) by mouth daily., Disp:  , Rfl:  .  citalopram (CELEXA) 10 MG tablet, Take 1 tablet (10 mg total) by mouth daily., Disp: 90 tablet, Rfl: 3 .  dextrose 5 % solution, Inject 75 mLs into the vein continuous., Disp: 1000 mL, Rfl: 2 .  fluticasone (FLONASE) 50 MCG/ACT nasal spray, Place 2 sprays into both nostrils daily as needed for allergies. SHAKE LIQUID AND USE 2 SPRAYS IN EACH NOSTRIL DAILY, Disp: , Rfl:  .  levothyroxine (SYNTHROID) 50 MCG tablet, Take 1 tablet (50 mcg total) by mouth daily at 6 (six) AM., Disp: 30 tablet, Rfl: 0 .  metoprolol tartrate (LOPRESSOR) 25 MG tablet, Take  1 tablet (25 mg total) by mouth 2 (two) times daily., Disp:  , Rfl:  .  Multiple Vitamin (MULTIVITAMIN WITH MINERALS) TABS tablet, Take 1 tablet by mouth daily., Disp:  , Rfl:  .  sodium chloride (OCEAN) 0.65 % SOLN nasal spray, Place 1 spray into both nostrils as needed for congestion., Disp:  , Rfl: 0 .  Vitamin D, Ergocalciferol, (DRISDOL) 1.25 MG (50000 UT) CAPS capsule, Take 1 capsule (50,000 Units total) by mouth every 7 (seven) days. (Patient not taking: Reported on 03/09/2019), Disp: 12 capsule, Rfl: 0 .  zinc sulfate 220 (50 Zn) MG capsule, Take 1 capsule (220 mg total) by mouth  daily., Disp:  , Rfl: :  Allergies  Allergen Reactions  . Penicillins     REACTION: Question of penicillin allergy Did it involve swelling of the face/tongue/throat, SOB, or low BP? Unknown Did it involve sudden or severe rash/hives, skin peeling, or any reaction on the inside of your mouth or nose? Unknown Did you need to seek medical attention at a hospital or doctor's office? Unknown When did it last happen?Unknown If all above answers are "NO", may proceed with cephalosporin use.   :  Family History  Problem Relation Age of Onset  . Diabetes Mother   . Cancer Father   . Cancer Sister   . Arthritis Son   :  Social History   Socioeconomic History  . Marital status: Widowed    Spouse name: Not on file  . Number of children: Not on file  . Years of education: Not on file  . Highest education level: Not on file  Occupational History  . Not on file  Tobacco Use  . Smoking status: Former Research scientist (life sciences)  . Smokeless tobacco: Never Used  Substance and Sexual Activity  . Alcohol use: No  . Drug use: No  . Sexual activity: Never  Other Topics Concern  . Not on file  Social History Narrative   Widow since 1970. 8 children Wheelchair bound due to arthritis.  lives with son. Discussed AVP in presence of son. Provided ACP packet (April '13)               Social Determinants of Health   Financial Resource Strain:   . Difficulty of Paying Living Expenses:   Food Insecurity:   . Worried About Charity fundraiser in the Last Year:   . Arboriculturist in the Last Year:   Transportation Needs:   . Film/video editor (Medical):   Marland Kitchen Lack of Transportation (Non-Medical):   Physical Activity:   . Days of Exercise per Week:   . Minutes of Exercise per Session:   Stress:   . Feeling of Stress :   Social Connections:   . Frequency of Communication with Friends and Family:   . Frequency of Social Gatherings with Friends and Family:   . Attends Religious Services:   .  Active Member of Clubs or Organizations:   . Attends Archivist Meetings:   Marland Kitchen Marital Status:   Intimate Partner Violence:   . Fear of Current or Ex-Partner:   . Emotionally Abused:   Marland Kitchen Physically Abused:   . Sexually Abused:   :   Assessment and Plan:   86 year old woman with:  1 thrombocytosis detected in the setting of acute hospitalization including multilobar pneumonia as well as COVID-19 infection.  There is an element of elevated platelet count back in 2020 which could have preceded her recent hospitalization.  Differential diagnosis were discussed today including reactive thrombocytosis from her recent infection versus myeloproliferative disorder such as essential thrombocythemia.  It is possible that she has combination of both which predisposed her to a much higher platelet count during an acute phase process and acute infection.  From a management standpoint to like to repeat her laboratory testing including myeloproliferative disorder work-up and a JAK2 mutation analysis.  Given her overall poor prognosis and limited mobility and currently residing in a skilled nursing facility, I do not recommend any extensive work-up or or management.  I would recommend a repeat CBC in the future.  2.  Thrombosis prophylaxis: She is currently anticoagulated and risk of thrombosis is low.   I discussed the assessment and treatment plan with the patient. The patient was provided an opportunity to ask questions and all were answered. The patient agreed with the plan and demonstrated an understanding of the instructions.   The patient was advised to call back or seek an in-person evaluation if the symptoms worsen or if the condition fails to improve as anticipated.  I provided 45 minutes of face-to-face video visit time that was converted to a phone call due to technical difficulties.  During this encounter, the time was spent on reviewing laboratory data, discussing differential  diagnosis and management options for the future.   A copy of this consult has been forwarded to the requesting physician.

## 2019-07-05 NOTE — Progress Notes (Addendum)
[facility] Consult Note Telephone: [phone]  Fax: [phone]  PATIENT NAME: Angela [name] DOB: Dec 25, 1918 MRN: [id number]  PRIMARY CARE PROVIDER:   [name], MD  REFERRING PROVIDER:  Dr. Twanna Hy  RESPONSIBLE PARTY:   Savi Lastinger   Son   [phone]     RECOMMENDATIONS and PLAN:  Palliative care encounter  Z51.5  1.  Advance care planning:  Plan on discussion with son.  Patient is unable to have detailed conversations or assist in decision making due to cognitive decline. MOST form was previously completed and in chart with selections of DNAR, DNI, comfort measures, antibiotics and IV fluids if indicated, no tube feedings.  2.  Weakness:  Multifactorial post COVID-19 pneumonia, protein calorie malutrition, thrombocytosis, advanced age.  Pending oncology consultation for thrombocytosis.  Continue to offer and assist with meals.  Nutritional supplements between meals.  Assist with all ADLs.  No expectation for major improvement.    3.  Multiple wounds:  Complicated by poor nutritional status.  Continue Prostat and Magic cup.  Dressings per wound care nurse.   4.  Cognitive deficit:  "Increasing chronic white matter ischemic change is noted" as per CT head report from March 2021.  FAST stage 7d.  Plan on additional discussion with Hospice provider and family related to transition to comfort care when no improvement of overall status is noted.  I spent 45 minutes providing this consultation,  from 1200 to 1245. More than 50% of the time in this consultation was spent coordinating communication with clinical staff and patient.  Review of medical records.    HISTORY OF PRESENT ILLNESS:  Angela [name] is a 86 y.o. 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  Staff  reports that patient remains very weak and has declined additionally.  She requires total care for all ADLs including feeding. She eats 25-50%.  Her weight in April was 89# and May 92#.  Her cognitive function has decreased with noted increased sleeping and very limited speech. She is incontinent of B&B.  Palliative Care was asked to help address goals of care.   CODE STATUS:  DNAR/DNI  PPS: 30% HOSPICE ELIGIBILITY/DIAGNOSIS: TBD  PAST MEDICAL HISTORY:  Past Medical History:  Diagnosis Date  . Adenocarcinoma of breast ([place])   . Allergic rhinitis   . Anemia   . Anxiety   . Depression   . Rheumatoid arthritis(714.0)      ALLERGIES:  Allergies  Allergen Reactions  . Penicillins     REACTION: Question of penicillin allergy Did it involve swelling of the face/tongue/throat, SOB, or low BP? Unknown Did it involve sudden or severe rash/hives, skin peeling, or any reaction on the inside of your mouth or nose? Unknown Did you need to seek medical attention at a hospital or doctor's office? Unknown When did it last happen?Unknown If all above answers are "NO", may proceed with cephalosporin use.      PERTINENT MEDICATIONS:  Outpatient Encounter Medications as of 07/04/2019  Medication Sig  . acetaminophen (TYLENOL) 325 MG tablet Take 2 tablets (650 mg total) by mouth every 6 (six) hours as needed for mild pain, moderate pain, fever or headache (fever >/= 101).  Marland Kitchen apixaban (ELIQUIS) 5 MG TABS tablet Take 1 tablet (5 mg total) by mouth 2 (two) times daily.  Marland Kitchen ascorbic  acid (VITAMIN C) 500 MG tablet Take 1 tablet (500 mg total) by mouth daily.  . citalopram (CELEXA) 10 MG tablet Take 1 tablet (10 mg total) by mouth daily.  Marland Kitchen dextrose 5 % solution Inject 75 mLs into the vein continuous.  . fluticasone (FLONASE) 50 MCG/ACT nasal spray Place 2 sprays into both nostrils daily as needed for allergies. SHAKE LIQUID AND USE 2 SPRAYS IN EACH NOSTRIL DAILY  . levothyroxine (SYNTHROID) 50 MCG  tablet Take 1 tablet (50 mcg total) by mouth daily at 6 (six) AM.  . metoprolol tartrate (LOPRESSOR) 25 MG tablet Take 1 tablet (25 mg total) by mouth 2 (two) times daily.  . Multiple Vitamin (MULTIVITAMIN WITH MINERALS) TABS tablet Take 1 tablet by mouth daily.  . sodium chloride (OCEAN) 0.65 % SOLN nasal spray Place 1 spray into both nostrils as needed for congestion.  . Vitamin D, Ergocalciferol, (DRISDOL) 1.25 MG (50000 UT) CAPS capsule Take 1 capsule (50,000 Units total) by mouth every 7 (seven) days. (Patient not taking: Reported on 03/09/2019)  . zinc sulfate 220 (50 Zn) MG capsule Take 1 capsule (220 mg total) by mouth daily.   No facility-administered encounter medications on file as of 07/04/2019.    PHYSICAL EXAM:   General: NAD, very frail appearing, thin elderly female reclining in bed Cardiovascular: regular rate and rhythm Pulmonary: clear ant fields. No increased respiratory effort.  O2 via [place] in use at 4L/min Abdomen: soft, nontender, + bowel sounds GU: no suprapubic tenderness.   Extremities: no edema, no muscle mass Skin: BLE are cool to touch.  Reports of multiple wounds but not seen due to intact dressings Neurological: Awake.  Unable to determine orientation due to cognitive deficits. Speaks in 2-3 words.  Does not follow commands.  Weakness  Gonzella Lex, NP-C

## 2019-08-05 DEATH — deceased

## 2021-07-03 IMAGING — CT CT ANGIO CHEST
2 of 7 series · 19 of 46 positions shown · IV contrast (APPLIED)
Comparison: None.

CLINICAL DATA: Elevated D-dimer.

EXAM:
CT ANGIOGRAPHY CHEST WITH CONTRAST
TECHNIQUE: Multidetector CT imaging of the chest was performed using the
standard protocol during bolus administration of intravenous
contrast. Multiplanar CT image reconstructions and MIPs were
obtained to evaluate the vascular anatomy.
CONTRAST:  70mL OMNIPAQUE IOHEXOL 350 MG/ML SOLN

[Series 8: thins · axial · 0.61mm/px · z∈[-291,-29]mm · 17 of 290 slices shown]
[im 14/290  lung]
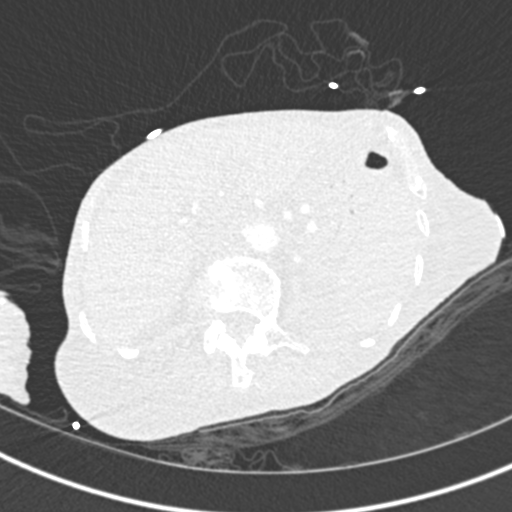
[im 28/290  soft-tissue]
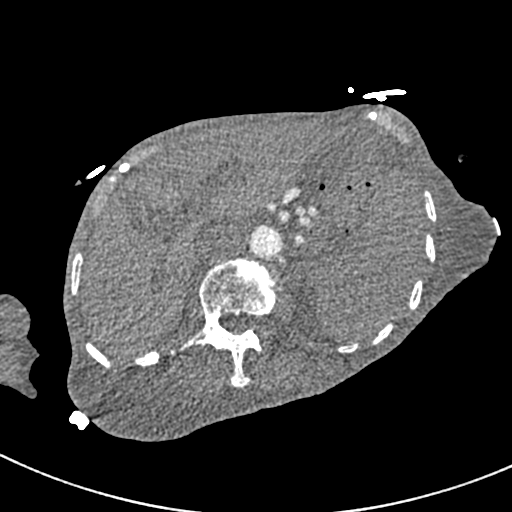
[im 42/290  lung]
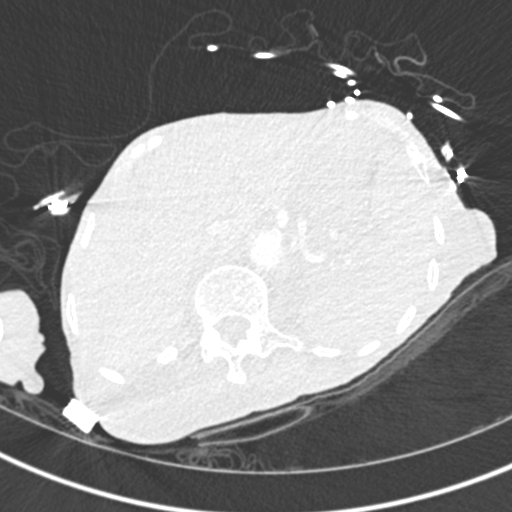
[im 69/290  soft-tissue]
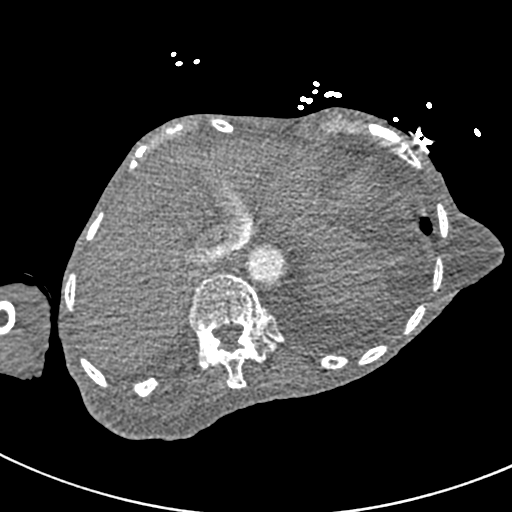
[im 83/290  lung]
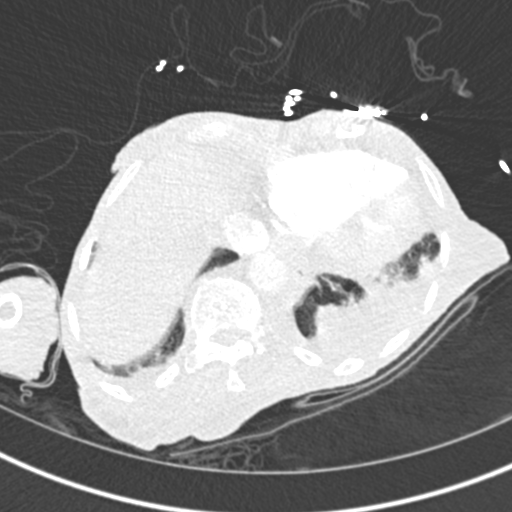
[im 97/290  soft-tissue]
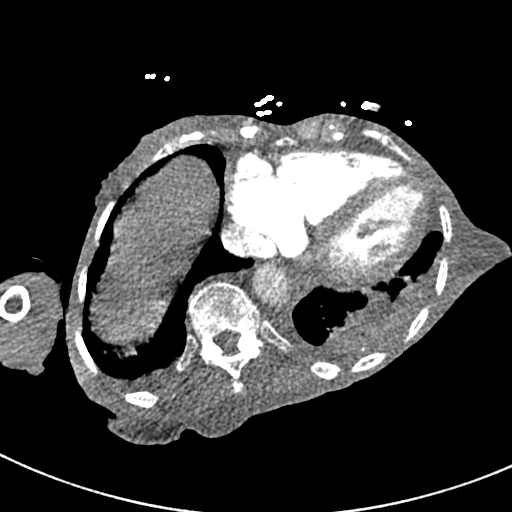
[im 111/290  lung]
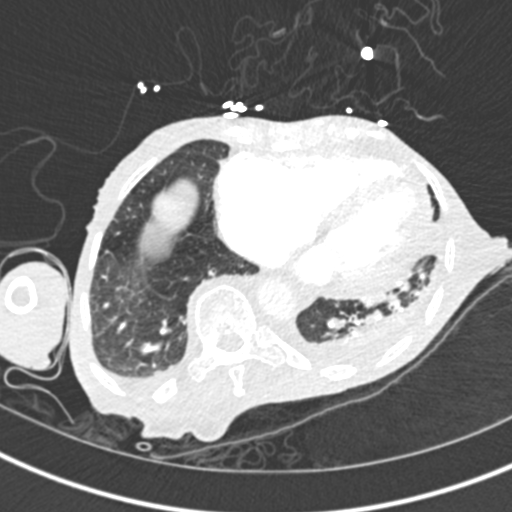
[im 124/290  soft-tissue]
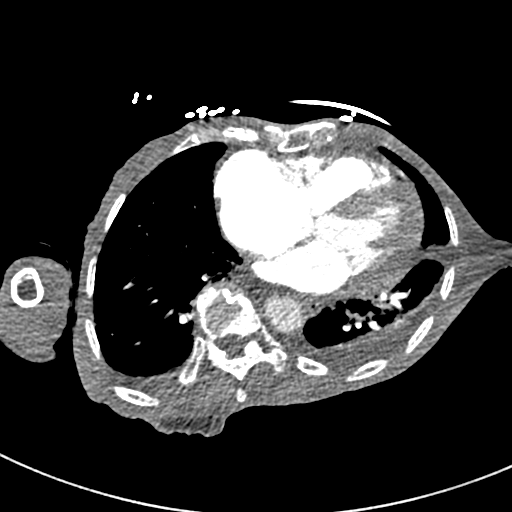
[im 152/290  lung]
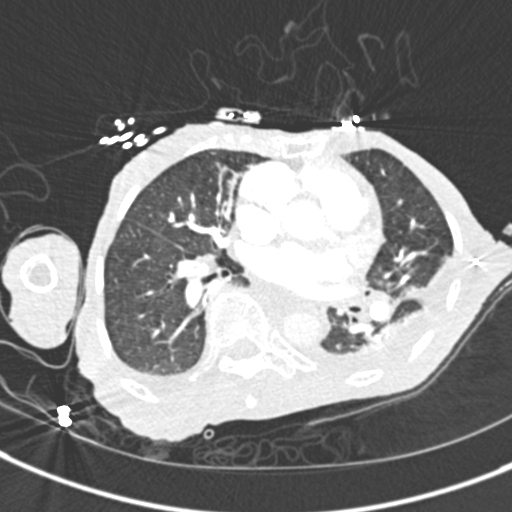
[im 166/290  soft-tissue]
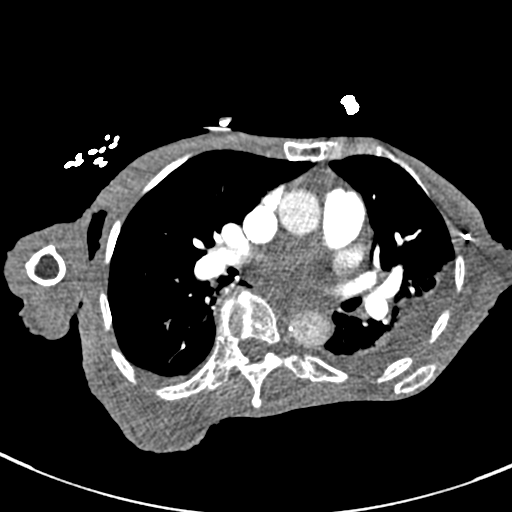
[im 179/290  lung]
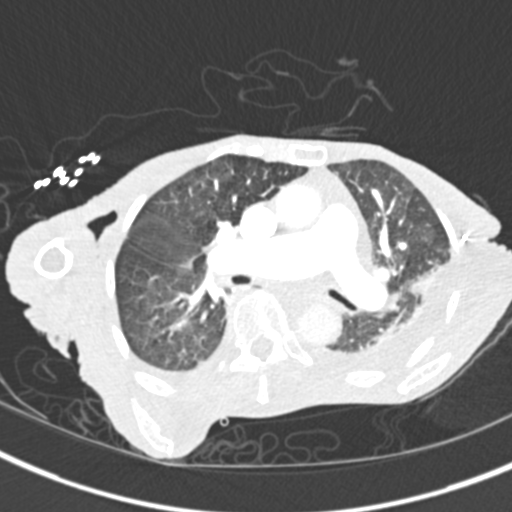
[im 193/290  soft-tissue]
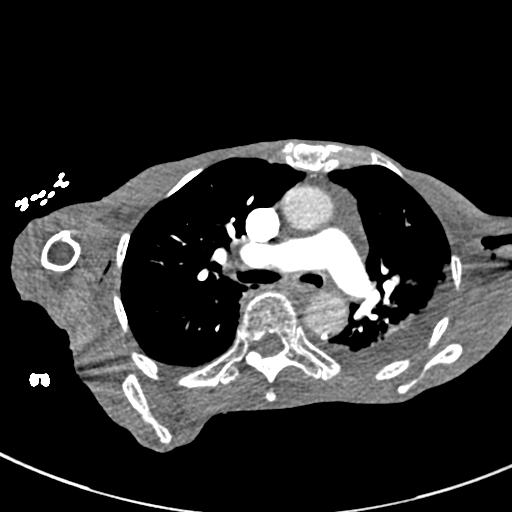
[im 207/290  lung]
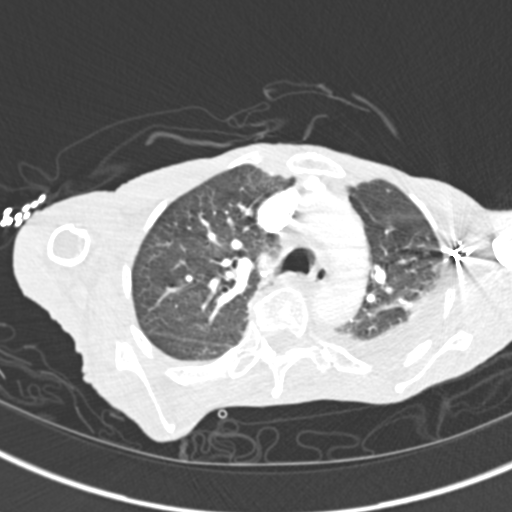
[im 221/290  soft-tissue]
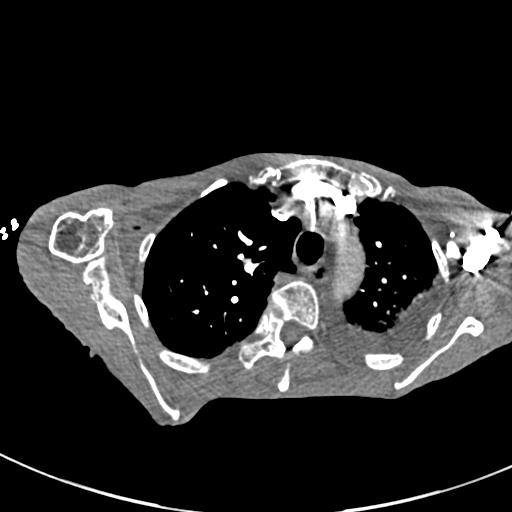
[im 248/290  lung]
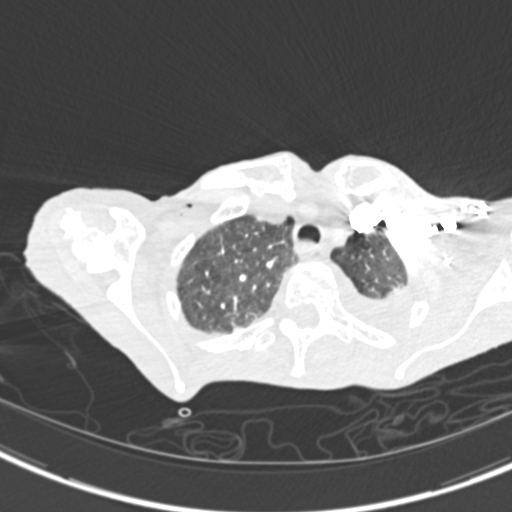
[im 262/290  soft-tissue]
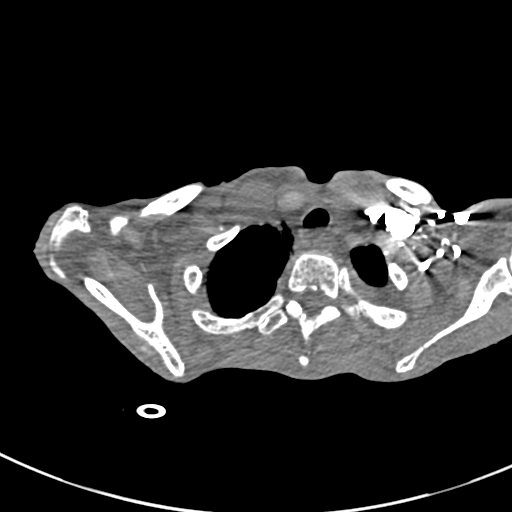
[im 276/290  lung]
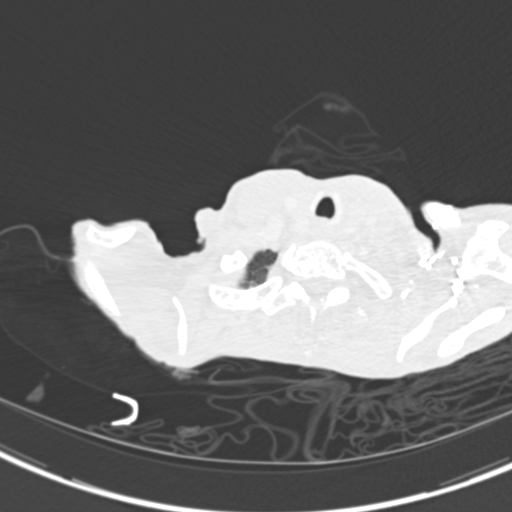

[Series 10: coronal mpr · coronal · 0.57mm/px · 2 of 70 slices shown]
[im 24/70  soft-tissue]
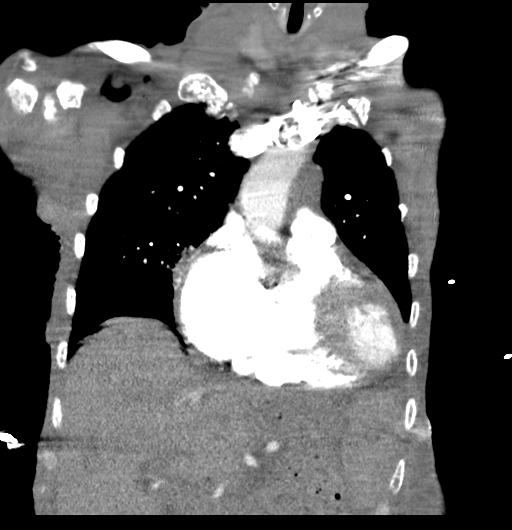
[im 47/70  soft-tissue]
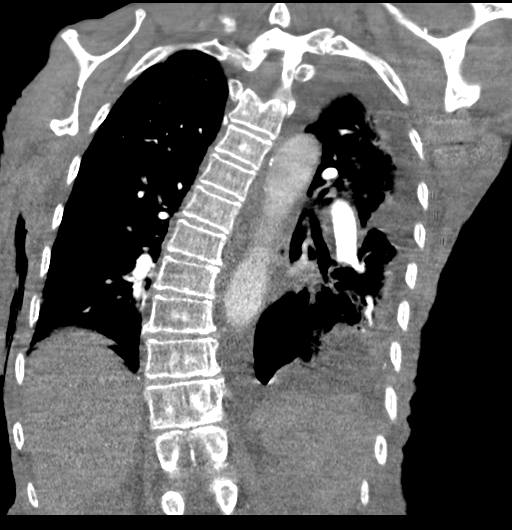

[19 of 46 positions shown; findings below may reference images not displayed]

FINDINGS: Cardiovascular: Satisfactory opacification of the pulmonary arteries
to the segmental level. Small pulmonary embolus in the right lower
lobe segmental pulmonary artery cardiomegaly. Thoracic aortic
atherosclerosis. No pericardial effusion.

Mediastinum/Nodes: No enlarged mediastinal, hilar, or axillary lymph
nodes. Thyroid gland, trachea, and esophagus demonstrate no
significant findings.

Lungs/Pleura: Small bilateral pleural effusions. Left basilar
atelectasis. Mild interstitial prominence. No focal consolidation.
No pneumothorax.

Upper Abdomen: No acute abnormality.

Musculoskeletal: No acute osseous abnormality. No aggressive osseous
lesion. Severe dextroscoliosis of the thoracolumbar spine. No
aggressive osseous lesion. Severe osteoarthritis of the right
glenohumeral joint. Severe osteoarthritis of the sternoclavicular
joints.

Other: Diffuse anasarca.

Review of the MIP images confirms the above findings.
IMPRESSION: 1. Small pulmonary embolus in the right lower lobe segmental
pulmonary artery cardiomegaly. RV/LV ratio 1.3 which can be seen
with right heart strain, but there is likely underlying right heart
failure in this patient with low PE burden.
2. Findings concerning for mild CHF.  Diffuse anasarca.
3. Aortic Atherosclerosis ([id number]).

Critical Value/emergent results were called by telephone at the time
of interpretation on 03/31/2019 at [DATE] to provider LOPATEGUI
ADRIAN ALFREDO , who verbally acknowledged these results.
